# Patient Record
Sex: Female | Born: 1950 | Race: White | Hispanic: No | Smoking: Former smoker
Health system: Southern US, Community
[De-identification: ages and names within clinical notes are randomized; demographics above are authoritative.]

## PROBLEM LIST (undated history)

## (undated) DIAGNOSIS — K259 Gastric ulcer, unspecified as acute or chronic, without hemorrhage or perforation: Secondary | ICD-10-CM

## (undated) DIAGNOSIS — F329 Major depressive disorder, single episode, unspecified: Secondary | ICD-10-CM

## (undated) DIAGNOSIS — R809 Proteinuria, unspecified: Secondary | ICD-10-CM

## (undated) DIAGNOSIS — T7840XA Allergy, unspecified, initial encounter: Secondary | ICD-10-CM

## (undated) DIAGNOSIS — F32A Depression, unspecified: Secondary | ICD-10-CM

## (undated) DIAGNOSIS — Z87898 Personal history of other specified conditions: Secondary | ICD-10-CM

## (undated) DIAGNOSIS — E559 Vitamin D deficiency, unspecified: Secondary | ICD-10-CM

## (undated) DIAGNOSIS — Z974 Presence of external hearing-aid: Secondary | ICD-10-CM

## (undated) DIAGNOSIS — H269 Unspecified cataract: Secondary | ICD-10-CM

## (undated) DIAGNOSIS — N390 Urinary tract infection, site not specified: Secondary | ICD-10-CM

## (undated) DIAGNOSIS — I10 Essential (primary) hypertension: Secondary | ICD-10-CM

## (undated) DIAGNOSIS — G43909 Migraine, unspecified, not intractable, without status migrainosus: Secondary | ICD-10-CM

## (undated) DIAGNOSIS — R609 Edema, unspecified: Secondary | ICD-10-CM

## (undated) DIAGNOSIS — R519 Headache, unspecified: Secondary | ICD-10-CM

## (undated) DIAGNOSIS — N189 Chronic kidney disease, unspecified: Secondary | ICD-10-CM

## (undated) DIAGNOSIS — N951 Menopausal and female climacteric states: Secondary | ICD-10-CM

## (undated) DIAGNOSIS — B029 Zoster without complications: Secondary | ICD-10-CM

## (undated) DIAGNOSIS — I82409 Acute embolism and thrombosis of unspecified deep veins of unspecified lower extremity: Secondary | ICD-10-CM

## (undated) DIAGNOSIS — H532 Diplopia: Secondary | ICD-10-CM

## (undated) DIAGNOSIS — R51 Headache: Secondary | ICD-10-CM

## (undated) DIAGNOSIS — K219 Gastro-esophageal reflux disease without esophagitis: Secondary | ICD-10-CM

## (undated) HISTORY — DX: Menopausal and female climacteric states: N95.1

## (undated) HISTORY — DX: Headache: R51

## (undated) HISTORY — DX: Acute embolism and thrombosis of unspecified deep veins of unspecified lower extremity: I82.409

## (undated) HISTORY — DX: Diplopia: H53.2

## (undated) HISTORY — DX: Chronic kidney disease, unspecified: N18.9

## (undated) HISTORY — DX: Major depressive disorder, single episode, unspecified: F32.9

## (undated) HISTORY — DX: Gastric ulcer, unspecified as acute or chronic, without hemorrhage or perforation: K25.9

## (undated) HISTORY — DX: Essential (primary) hypertension: I10

## (undated) HISTORY — DX: Proteinuria, unspecified: R80.9

## (undated) HISTORY — DX: Urinary tract infection, site not specified: N39.0

## (undated) HISTORY — DX: Allergy, unspecified, initial encounter: T78.40XA

## (undated) HISTORY — DX: Zoster without complications: B02.9

## (undated) HISTORY — PX: EYE SURGERY: SHX253

## (undated) HISTORY — DX: Migraine, unspecified, not intractable, without status migrainosus: G43.909

## (undated) HISTORY — DX: Gastro-esophageal reflux disease without esophagitis: K21.9

## (undated) HISTORY — DX: Headache, unspecified: R51.9

## (undated) HISTORY — DX: Depression, unspecified: F32.A

## (undated) HISTORY — DX: Edema, unspecified: R60.9

---

## 1981-07-29 HISTORY — PX: BREAST ENHANCEMENT SURGERY: SHX7

## 1981-07-29 HISTORY — PX: AUGMENTATION MAMMAPLASTY: SUR837

## 1998-07-29 HISTORY — PX: NASAL SEPTUM SURGERY: SHX37

## 2006-07-29 LAB — HM COLONOSCOPY: Colonoscopy, External: NORMAL

## 2008-07-29 DIAGNOSIS — I82409 Acute embolism and thrombosis of unspecified deep veins of unspecified lower extremity: Secondary | ICD-10-CM

## 2008-07-29 HISTORY — DX: Acute embolism and thrombosis of unspecified deep veins of unspecified lower extremity: I82.409

## 2008-07-29 LAB — HM DEXA SCAN
Amb DEXA, External: NORMAL
BONE DENSITY, EXTERNAL: NORMAL

## 2010-05-02 LAB — HM MAMMOGRAPHY: Mammography, External: NORMAL

## 2011-08-05 LAB — HM PAP SMEAR: Pap Smear, External: NEGATIVE

## 2012-01-03 LAB — HM MAMMOGRAPHY

## 2012-06-09 LAB — HM COLONOSCOPY: HM Colonoscopy: ABNORMAL — AB

## 2013-01-14 LAB — BASIC METABOLIC PANEL
BUN: 13 mg/dL (ref 4–21)
Creatinine: 1 mg/dL (ref 0.5–1.1)
GLUCOSE: 96 mg/dL
Potassium: 4.4 mmol/L (ref 3.4–5.3)
SODIUM: 143 mmol/L (ref 137–147)

## 2013-01-14 LAB — LIPID PANEL
CHOLESTEROL: 189 mg/dL (ref 0–200)
HDL: 59 mg/dL (ref 35–70)
LDL Cholesterol: 111 mg/dL
Triglycerides: 96 mg/dL (ref 40–160)

## 2013-01-14 LAB — HEPATIC FUNCTION PANEL
ALT: 16 U/L (ref 7–35)
AST: 18 U/L (ref 13–35)
Alkaline Phosphatase: 56 U/L (ref 25–125)
BILIRUBIN, TOTAL: 1 mg/dL

## 2013-01-14 LAB — HEMOGLOBIN A1C: HEMOGLOBIN A1C: 5.6

## 2013-01-14 LAB — MICROALBUMIN, URINE: Microalb, Ur: 14.4

## 2013-01-14 LAB — AMB EXT LDL-C: LDL-C, External: 111

## 2013-01-14 LAB — AMB EXT HGBA1C: Hemoglobin A1c, External: 5.6

## 2013-04-22 LAB — CBC AND DIFFERENTIAL
HEMATOCRIT: 40 % (ref 36–46)
HEMOGLOBIN: 12.7 g/dL (ref 12.0–16.0)
Neutrophils Absolute: 4 /uL
Platelets: 253 10*3/uL (ref 150–399)
WBC: 6.3 10*3/mL

## 2013-04-22 LAB — BASIC METABOLIC PANEL
BUN: 18 mg/dL (ref 4–21)
CREATININE: 1 mg/dL (ref 0.5–1.1)
GLUCOSE: 99 mg/dL
POTASSIUM: 4.4 mmol/L (ref 3.4–5.3)
SODIUM: 141 mmol/L (ref 137–147)

## 2013-04-22 LAB — HEPATIC FUNCTION PANEL
ALT: 14 U/L (ref 7–35)
AST: 17 U/L (ref 13–35)
Alkaline Phosphatase: 58 U/L (ref 25–125)
Bilirubin, Total: 0.6 mg/dL

## 2014-02-04 MED ORDER — BUPROPION 75 MG TAB
75 mg | ORAL_TABLET | Freq: Two times a day (BID) | ORAL | Status: DC
Start: 2014-02-04 — End: 2014-09-06

## 2014-02-04 MED ORDER — ALPRAZOLAM 0.5 MG TAB
0.5 mg | ORAL_TABLET | Freq: Three times a day (TID) | ORAL | Status: DC | PRN
Start: 2014-02-04 — End: 2014-09-06

## 2014-02-04 MED ORDER — TIZANIDINE 4 MG TAB
4 mg | ORAL_TABLET | Freq: Every evening | ORAL | Status: DC
Start: 2014-02-04 — End: 2014-09-06

## 2014-02-04 MED ORDER — FUROSEMIDE 20 MG TAB
20 mg | ORAL_TABLET | Freq: Every day | ORAL | Status: DC
Start: 2014-02-04 — End: 2014-09-06

## 2014-02-04 MED ORDER — CITALOPRAM 20 MG TAB
20 mg | ORAL_TABLET | Freq: Every day | ORAL | Status: DC
Start: 2014-02-04 — End: 2014-08-24

## 2014-02-04 MED ORDER — ESOMEPRAZOLE MAGNESIUM 40 MG CAP, DELAYED RELEASE
40 mg | ORAL_CAPSULE | Freq: Every day | ORAL | Status: DC
Start: 2014-02-04 — End: 2014-08-24

## 2014-02-04 NOTE — Progress Notes (Signed)
[facility]  [address]  [place], [place] [address]  Phone [phone]  Fax:  [phone]    Patient: April Moss  Date of Birth: August 03, 1950  MEDICAL RECORD NUMBER [id number]  Visit Date: 02/04/2014  Author:  Paula Compton L. Fermin Schwab    Family Practice Clinic Note    Chief Complaint   Patient presents with   ??? Anxiety   ??? Depression       History of Present Illness  Anxiety and or Depression  Donise Woodle is a 63 y.o. female who presents for a complaint of anxiety/depression. she's symptoms began 30 years ago and include: Symptoms; depression:1002}, depressed, grieving,  difficulty concentrating sleeping more. Additional complaints include: depression,  But notes she is improving and wants to taper back on wellbutrin . Patient is able/unable to identify any inciting event or aggravating factors. she has/does not have a history of anxiety/depression/panic attacks, and has/has not undergone treatment in the past. Past treatments include: SSRI, Wellbutrin, Celexa. Patient does have a family history of anxiety/depression.             Past History:    Allergies:  Allergies   Allergen Reactions   ??? Erythromycin Nausea and Vomiting   ??? Sulfa (Sulfonamide Antibiotics) Unknown (comments)     Can't remember       Current Problem List:   Patient Active Problem List   Diagnosis Code   ??? 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 Esophageal reflux 530.81   ??? Depressive disorder, not elsewhere classified 311   ??? Chronic kidney disease, unspecified 585.9   ??? Unspecified vitamin D deficiency 268.9   ??? Allergic rhinitis, cause unspecified 477.9   ??? Unspecified essential hypertension 401.9   ??? Other and unspecified hyperlipidemia 272.4   ??? Arthralgia of temporomandibular joint 524.62   ??? Unspecified hearing loss 389.9   ??? Other malaise and fatigue 780.79       Current Medications:   Current Outpatient Prescriptions   Medication Sig Dispense    ??? tiZANidine (ZANAFLEX) 4 mg tablet Take 4 mg by mouth every six (6) hours as needed.    ??? furosemide (LASIX) 20 mg tablet Take  by mouth daily.    ??? citalopram (CELEXA) 20 mg tablet Take  by mouth daily.    ??? esomeprazole (NEXIUM) 40 mg capsule Take  by mouth daily.    ??? ALPRAZolam (XANAX) 0.5 mg tablet Take  by mouth.    ??? buPROPion XL (WELLBUTRIN XL) 150 mg tablet Take 150 mg by mouth every morning.      No current facility-administered medications for this visit.       Social History:   History   Substance Use Topics   ??? Smoking status: Former Smoker     Quit date: 02/05/1971   ??? Smokeless tobacco: Not on file      Comment: SMOKED FOR 1 YEAR AT AGE 84; NONE SINCE THEN   ??? Alcohol Use: No       Family History:   Family History   Problem Relation Age of Onset   ??? Hypertension Mother    ??? Arthritis-osteo Mother    ??? Hypertension Father    ??? Arthritis-osteo Father    ??? Migraines Father    ??? Diabetes Brother      NON-INSULIN DEPENDENT   ??? Hypertension Brother    ??? Migraines Brother    ??? Stroke Maternal Grandmother      IN HER 80s   ???  Diabetes Maternal Grandfather      NON-INSULIN DEPENDENT   ??? Cancer Paternal Grandfather      PANCREATIC   ??? Cancer Maternal Grandmother      unknown type       Surgical History:  Past Surgical History   Procedure Laterality Date   ??? Hx breast augmentation  1983   ??? Hx other surgical  2000     SINUS         ROS  Review of Systems   Respiratory: Negative for cough and shortness of breath.    Cardiovascular: Negative for chest pain and leg swelling.        No claudication   Neurological: Negative for dizziness and light-headedness.   Psychiatric/Behavioral: Negative for confusion.         BP 150/90 mmHg   Pulse 95   Temp(Src) 98.8 ??F (37.1 ??C) (Oral)   Ht 5\' 5"  (1.651 m)   Wt 217 lb 6.4 oz (98.612 kg)   BMI 36.18 kg/m2   SpO2 94%  Body mass index is 36.18 kg/(m^2).   Physical Exam    Physical Exam   Constitutional: She is oriented to person, place, and time. She appears  well-developed and well-nourished.   Neck: Neck supple. No thyroid mass and no thyromegaly present.   Cardiovascular: Normal rate, regular rhythm and normal heart sounds.    Pulses:       Posterior tibial pulses are 2+ on the right side, and 2+ on the left side.   Pulmonary/Chest: Effort normal and breath sounds normal.   Musculoskeletal:   No lower extremity edema.   Neurological: She is alert and oriented to person, place, and time.   Skin: No rash noted.   Psychiatric: She has a normal mood and affect.             ASSESSMENT & PLAN    1. Gastroesophageal reflux disease without esophagitis  stable  - esomeprazole (NEXIUM) 40 mg capsule; Take 1 Cap by mouth daily. Indications: GASTROESOPHAGEAL REFLUX  Dispense: 30 Cap; Refill: 5    2. History of ulcer disease  stable  - esomeprazole (NEXIUM) 40 mg capsule; Take 1 Cap by mouth daily. Indications: GASTROESOPHAGEAL REFLUX  Dispense: 30 Cap; Refill: 5  - CBC WITH AUTOMATED DIFF; Standing    3. Depressive disorder, not elsewhere classified  Stable on med  - citalopram (CELEXA) 20 mg tablet; Take 1 Tab by mouth daily.  Dispense: 30 Tab; Refill: 5  - ALPRAZolam (XANAX) 0.5 mg tablet; Take 1 Tab by mouth three (3) times daily as needed for Anxiety. Max Daily Amount: 1.5 mg.  Dispense: 60 Tab; Refill: 5  - buPROPion (WELLBUTRIN) 75 mg tablet; Take 1 Tab by mouth two (2) times a day.  Dispense: 60 Tab; Refill: 5    4. Myalgia and myositis, unspecified  stable  - tiZANidine (ZANAFLEX) 4 mg tablet; Take 1 Tab by mouth nightly.  Dispense: 30 Tab; Refill: 1    5. Unspecified vitamin D deficiency  Stable - needs reevaluated   - VITAMIN D, 25 HYDROXY; Standing    6. Edema  stable  - furosemide (LASIX) 20 mg tablet; Take 1 Tab by mouth daily.  Dispense: 30 Tab; Refill: 5  - METABOLIC PANEL, COMPREHENSIVE; Standing    7. Anemia, unspecified anemia type  recheck  - CBC WITH AUTOMATED DIFF; Standing    8. Abnormal thyroid blood test  Recheck for underlying disease   - THYROID PANEL W/TSH; Standing  9. Mixed hyperlipidemia  stable  - METABOLIC PANEL, COMPREHENSIVE; Standing  - LIPID PANEL; Standing    I have reviewed the patient's past medical history, social history and family history and vitals.  We have discussed treatment plan and follow up and given patient instructions.  Patient's questions are answered and we will follow up as indicated.    Follow-up Disposition:  Return in about 6 weeks (around 03/18/2014) for 6 weeks med/bp recheck 6 months labs and recheck, Labs 1 week prior to office visit.    Jamian Andujo L. Konrad Dolores, PA-C

## 2014-02-04 NOTE — Patient Instructions (Signed)
Preventing a Relapse of Depression: After Your Visit  Your Care Instructions  A relapse of depression means your symptoms have come back after you have gotten better. This illness often comes and goes during a lifetime. But there are many things you can do to keep it from coming back.  Follow-up care is a key part of your treatment and safety. Be sure to make and go to all appointments, and call your doctor if you are having problems. It's also a good idea to know your test results and keep a list of the medicines you take.  What do you need to know?  Know your risk of relapse  Talk to your doctor to find out if you are at risk of relapse. Many things can make a person more likely to relapse into depression. These include having a family member with depression, dealing with serious problems in a relationship or a job, having a serious medical condition, or abusing drugs or alcohol.  It is important to know your risk and to recognize warning signs of relapse. Once you know these things, you will be better able to keep it from happening to you.  Know the warning signs of relapse  The two most common signs of relapse are:  ?? Feeling sad or hopeless.  ?? Losing interest in your daily activities.  You may have other symptoms, such as:  ?? You lose or gain weight.  ?? You sleep too much or not enough.  ?? You feel restless and unable to sit still.  ?? You feel unable to move.  ?? You feel tired all the time.  ?? You feel unworthy or guilty without an obvious reason.  ?? You have problems concentrating, remembering, or making decisions.  ?? You think often about death or suicide.  ?? You feel angry or have panic attacks.  How can you care for yourself at home?  ?? Take your medicine as prescribed. Call your doctor if you have any problems with your medicine. Many people take their medicines for at least 6 months after they have recovered. This often helps keep symptoms from  coming back. However, if your depression keeps coming back, you may have to take medicine for the rest of your life.  ?? Continue counseling even after you have stopped taking medicine.  ?? Eat healthy foods. Include fruits, vegetables, beans, and whole grains in your diet each day.  ?? Get at least 30 minutes of exercise on most days of the week. Walking is a good choice. You also may want to do other activities, such as running, swimming, cycling, or playing tennis or team sports.  ?? See your doctor right away if you have new symptoms or feel that your depression is coming back.  ?? Keep a regular sleep schedule. Try for 8 hours of sleep a night.  ?? Avoid alcohol and illegal drugs.  ?? Keep the numbers for these national suicide hotlines: 1-800-273-TALK (1-800-273-8255) and 1-800-SUICIDE (1-800-784-2433). If you or someone you know talks about suicide or feeling hopeless, get help right away.  When should you call for help?  Call 911 anytime you think you may need emergency care. For example, call if:  ?? You are thinking about suicide or are threatening suicide.  ?? You feel you cannot stop from hurting yourself or someone else.  ?? You hear or see things that aren't real.  ?? You think or speak in a bizarre way that is not like your usual behavior.    Call your doctor now or seek immediate medical care if:  ?? You are drinking a lot of alcohol or using illegal drugs.  ?? You are talking or writing about death.  Watch closely for changes in your health, and be sure to contact your doctor if:  ?? You find it hard or it's getting harder to deal with school, a job, family, or friends.  ?? You think your treatment is not helping or you are not getting better.  ?? Your symptoms get worse or you get new symptoms.  ?? You have any problems with your antidepressant medicines, such as side effects, or you are thinking about stopping your medicine.  ?? You are having manic behavior, such as having very high energy, needing  less sleep than normal, or showing risky behavior such as spending money you don't have or abusing others verbally or physically.   Where can you learn more?   Go to http://www.healthwise.net/[facility]  Enter C630 in the search box to learn more about "Preventing a Relapse of Depression: After Your Visit."   ?? 2006-2015 Healthwise, [employer]. Care instructions adapted under license by [place] (which disclaims liability or warranty for this information). This care instruction is for use with your licensed healthcare professional. If you have questions about a medical condition or this instruction, always ask your healthcare professional. Healthwise, Incorporated disclaims any warranty or liability for your use of this information.  Content Version: 10.5.422740; Current as of: June 11, 2013

## 2014-08-24 ENCOUNTER — Encounter

## 2014-08-24 MED ORDER — CITALOPRAM 20 MG TAB
20 mg | ORAL_TABLET | Freq: Every day | ORAL | Status: DC
Start: 2014-08-24 — End: 2014-09-06

## 2014-08-24 MED ORDER — ESOMEPRAZOLE MAGNESIUM 40 MG CAP, DELAYED RELEASE
40 mg | ORAL_CAPSULE | Freq: Every day | ORAL | Status: DC
Start: 2014-08-24 — End: 2015-02-13

## 2014-08-24 NOTE — Telephone Encounter (Signed)
Requested Prescriptions     Pending Prescriptions Disp Refills   ??? esomeprazole (NEXIUM) 40 mg capsule 30 Cap 3     Sig: Take 1 Cap by mouth daily. Indications: GASTROESOPHAGEAL REFLUX   ??? citalopram (CELEXA) 20 mg tablet 30 Tab 3     Sig: Take 1 Tab by mouth daily.

## 2014-09-06 ENCOUNTER — Ambulatory Visit: Admit: 2014-09-06 | Discharge: 2014-09-06 | Payer: BLUE CROSS/BLUE SHIELD | Attending: Medical | Primary: Medical

## 2014-09-06 DIAGNOSIS — F330 Major depressive disorder, recurrent, mild: Secondary | ICD-10-CM

## 2014-09-06 MED ORDER — ALPRAZOLAM 0.5 MG TAB
0.5 mg | ORAL_TABLET | Freq: Three times a day (TID) | ORAL | Status: DC | PRN
Start: 2014-09-06 — End: 2015-02-13

## 2014-09-06 MED ORDER — CITALOPRAM 20 MG TAB
20 mg | ORAL_TABLET | Freq: Every day | ORAL | Status: DC
Start: 2014-09-06 — End: 2015-02-13

## 2014-09-06 MED ORDER — BUPROPION 75 MG TAB
75 mg | ORAL_TABLET | Freq: Two times a day (BID) | ORAL | Status: AC
Start: 2014-09-06 — End: ?

## 2014-09-06 MED ORDER — FUROSEMIDE 20 MG TAB
20 mg | ORAL_TABLET | Freq: Every day | ORAL | Status: DC | PRN
Start: 2014-09-06 — End: 2015-02-13

## 2014-09-06 MED ORDER — TIZANIDINE 4 MG TAB
4 mg | ORAL_TABLET | Freq: Every evening | ORAL | Status: DC
Start: 2014-09-06 — End: 2015-02-13

## 2014-09-06 NOTE — Patient Instructions (Signed)
Recovering From Depression: Care Instructions  Your Care Instructions  Taking good care of yourself is important as you recover from depression. In time, your symptoms will fade as your treatment takes hold. Do not give up. Instead, focus your energy on getting better.  Your mood will improve. It just takes some time. Focus on things that can help you feel better, such as being with friends and family, eating well, and getting enough rest. But take things slowly. Do not do too much too soon. You will begin to feel better gradually.  Follow-up care is a key part of your treatment and safety. Be sure to make and go to all appointments, and call your doctor if you are having problems. It's also a good idea to know your test results and keep a list of the medicines you take.  How can you care for yourself at home?  Be realistic  ?? If you have a large task to do, break it up into smaller steps you can handle, and just do what you can.  ?? You may want to put off important decisions until your depression has lifted. If you have plans that will have a major impact on your life, such as marriage, divorce, or a job change, try to wait a bit. Talk it over with friends and loved ones who can help you look at the overall picture first.  ?? Reaching out to people for help is important. Do not isolate yourself. Let your family and friends help you. Find someone you can trust and confide in, and talk to that person.  ?? Be patient, and be kind to yourself. Remember that depression is not your fault and is not something you can overcome with willpower alone. Treatment is necessary for depression, just like for any other illness. Feeling better takes time, and your mood will improve little by little.  Stay active  ?? Stay busy and get outside. Take a walk, or try some other light exercise.  ?? Talk with your doctor about an exercise program. Exercise can help with mild depression.   ?? Go to a movie or concert. Take part in a church activity or other social gathering. Go to a ball game.  ?? Ask a friend to have dinner with you.  Take care of yourself  ?? Eat a balanced diet with plenty of fresh fruits and vegetables, whole grains, and lean protein. If you have lost your appetite, eat small snacks rather than large meals.  ?? Avoid drinking alcohol or using illegal drugs. Do not take medicines that have not been prescribed for you. They may interfere with medicines you may be taking for depression, or they may make your depression worse.  ?? Take your medicines exactly as they are prescribed. You may start to feel better within 1 to 3 weeks of taking antidepressant medicine. But it can take as many as 6 to 8 weeks to see more improvement. If you have questions or concerns about your medicines, or if you do not notice any improvement by 3 weeks, talk to your doctor.  ?? If you have any side effects from your medicine, tell your doctor. Antidepressants can make you feel tired, dizzy, or nervous. Some people have dry mouth, constipation, headaches, sexual problems, or diarrhea. Many of these side effects are mild and will go away on their own after you have been taking the medicine for a few weeks. Some may last longer. Talk to your doctor if side effects are   bothering you too much. You might be able to try a different medicine.  ?? Get enough sleep. If you have problems sleeping:  ?? Go to bed at the same time every night, and get up at the same time every morning.  ?? Keep your bedroom dark and quiet.  ?? Do not exercise after 5:00 p.m.  ?? Avoid drinks with caffeine after 5:00 p.m.  ?? Avoid sleeping pills unless they are prescribed by the doctor treating your depression. Sleeping pills may make you groggy during the day, and they may interact with other medicine you are taking.  ?? If you have any other illnesses, such as diabetes, heart disease, or  high blood pressure, make sure to continue with your treatment. Tell your doctor about all of the medicines you take, including those with or without a prescription.  ?? Keep the numbers for these national suicide hotlines: 1-800-273-TALK (1-800-273-8255) and 1-800-SUICIDE (1-800-784-2433). If you or someone you know talks about suicide or feeling hopeless, get help right away.  When should you call for help?  Call 911 anytime you think you may need emergency care. For example, call if:  ?? You feel like hurting yourself or someone else.  ?? Someone you know has depression and is about to attempt or is attempting suicide.  Call your doctor now or seek immediate medical care if:  ?? You hear voices.  ?? Someone you know has depression and:  ?? Starts to give away his or her possessions.  ?? Uses illegal drugs or drinks alcohol heavily.  ?? Talks or writes about death, including writing suicide notes or talking about guns, knives, or pills.  ?? Starts to spend a lot of time alone.  ?? Acts very aggressively or suddenly appears calm.  Watch closely for changes in your health, and be sure to contact your doctor if:  ?? You do not get better as expected.   Where can you learn more?   Go to http://www.healthwise.net/[facility]  Enter N529 in the search box to learn more about "Recovering From Depression: Care Instructions."   ?? 2006-2015 Healthwise, Inc[employer]. Care instructions adapted under license by [place] (which disclaims liability or warranty for this information). This care instruction is for use with your licensed healthcare professional. If you have questions about a medical condition or this instruction, always ask your healthcare professional. [employer], Inc[employer] disclaims any warranty or liability for your use of this information.  Content Version: 10.7.482551; Current as of: November 18, 2013

## 2014-09-06 NOTE — Progress Notes (Signed)
[facility]  [address]  [place], [place] [address]  Phone [phone]  Fax:  [phone]    Patient: April Moss  Date of Birth: 04-06-1951  MEDICAL RECORD NUMBER [id number]  Visit Date: 09/06/2014  Author:  Paula Compton L. Fermin Schwab    Family Practice Clinic Note    Chief Complaint   Patient presents with   ??? Anxiety   ??? Depression       History of Present Illness  Chronic issues  Depression and Anxiety  Feels stable on meds uses wellbutrin 2 one day and 1 the alternative day, uses celexa every day and then xanax prn    Edema  Uses lasix prn and not very often and stable    Muscle pain  Stable on meds but ran out recently  Uses zanaflex for neck and shoulder pain at night at night and is prn and doesn't always need it but ran out and noticed it too        Past History:    Allergies:  Allergies   Allergen Reactions   ??? Erythromycin Nausea and Vomiting   ??? Sulfa (Sulfonamide Antibiotics) Unknown (comments)     Can't remember       Current Problem List:   Patient Active Problem List   Diagnosis Code   ??? 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 Esophageal reflux K21.9   ??? Depressive disorder, not elsewhere classified F32.9   ??? Chronic kidney disease, unspecified N18.9   ??? Unspecified vitamin D deficiency E55.9   ??? Allergic rhinitis, cause unspecified J30.9   ??? Unspecified essential hypertension I10   ??? Other and unspecified hyperlipidemia E78.5   ??? Arthralgia of temporomandibular joint M26.62   ??? Unspecified hearing loss H91.90   ??? Other malaise and fatigue R53.81, R53.83   ??? Myalgia and myositis, unspecified M79.1, M60.9   ??? Dysmetabolic syndrome X E88.81       Current Medications:   Current Outpatient Prescriptions   Medication Sig Dispense   ??? esomeprazole (NEXIUM) 40 mg capsule Take 1 Cap by mouth daily. Indications: GASTROESOPHAGEAL REFLUX 30 Cap   ??? citalopram (CELEXA) 20 mg tablet Take 1 Tab by mouth daily. 30 Tab   ??? furosemide (LASIX) 20 mg tablet Take 1 Tab by mouth daily. 30 Tab    ??? ALPRAZolam (XANAX) 0.5 mg tablet Take 1 Tab by mouth three (3) times daily as needed for Anxiety. Max Daily Amount: 1.5 mg. 60 Tab   ??? tiZANidine (ZANAFLEX) 4 mg tablet Take 1 Tab by mouth nightly. 30 Tab   ??? buPROPion (WELLBUTRIN) 75 mg tablet Take 1 Tab by mouth two (2) times a day. 60 Tab     No current facility-administered medications for this visit.       Social History:   History     Social History Narrative      History     Social History   ??? Marital Status: SINGLE     Spouse Name: N/A     Number of Children: N/A   ??? Years of Education: N/A     Occupational History   ??? Not on file.     Social History Main Topics   ??? Smoking status: Former Smoker     Quit date: 02/05/1971   ??? Smokeless tobacco: Not on file      Comment: SMOKED FOR 1 YEAR AT AGE 22; NONE SINCE THEN   ??? Alcohol Use: No   ??? Drug Use: No   ???  Sexual Activity: Not on file     Other Topics Concern   ??? Not on file     Social History Narrative       Family History:   Family History   Problem Relation Age of Onset   ??? Hypertension Mother    ??? Arthritis-osteo Mother    ??? Hypertension Father    ??? Arthritis-osteo Father    ??? Migraines Father    ??? Diabetes Brother      NON-INSULIN DEPENDENT   ??? Hypertension Brother    ??? Migraines Brother    ??? Stroke Maternal Grandmother      IN HER 80s   ??? Diabetes Maternal Grandfather      NON-INSULIN DEPENDENT   ??? Cancer Paternal Grandfather      PANCREATIC   ??? Cancer Maternal Grandmother      unknown type      No family status information on file.       Surgical History:  Past Surgical History   Procedure Laterality Date   ??? Hx breast augmentation  1983   ??? Hx other surgical  2000     SINUS         ROS  Review of Systems   Constitutional: Negative for fever and chills.   Respiratory: Negative for shortness of breath.    Cardiovascular: Positive for chest pain. Negative for palpitations and leg swelling.   Gastrointestinal: Positive for nausea. Negative for vomiting.   Musculoskeletal: Positive for arthralgias.    Skin: Negative for rash.   Neurological: Negative for dizziness and light-headedness.   Psychiatric/Behavioral: Positive for decreased concentration and agitation. Negative for suicidal ideas, confusion and self-injury. The patient is nervous/anxious.          BP 110/80 mmHg   Pulse 101   Temp(Src) 97.9 ??F (36.6 ??C) (Oral)   Ht 5\' 5"  (1.651 m)   Wt 224 lb (101.606 kg)   BMI 37.28 kg/m2   SpO2 96%  Body mass index is 37.28 kg/(m^2).   Physical Exam    Physical Exam   Constitutional: She is oriented to person, place, and time. She appears well-developed and well-nourished.   Cardiovascular: Normal rate and regular rhythm.    Pulmonary/Chest: Effort normal and breath sounds normal.   Neurological: She is alert and oriented to person, place, and time.   Skin: Skin is warm.   Psychiatric: Her speech is normal. Judgment and thought content normal. She is slowed. Cognition and memory are normal. She exhibits a depressed mood.   Nursing note and vitals reviewed.          ASSESSMENT & PLAN  1. Mild episode of recurrent major depressive disorder (HCC)    - citalopram (CELEXA) 20 mg tablet; Take 1 Tab by mouth daily.  Dispense: 30 Tab; Refill: 5    2. Other depression    - buPROPion (WELLBUTRIN) 75 mg tablet; Take 1 Tab by mouth two (2) times a day.  Dispense: 60 Tab; Refill: 5  - ALPRAZolam (XANAX) 0.5 mg tablet; Take 1 Tab by mouth three (3) times daily as needed for Anxiety. Max Daily Amount: 1.5 mg.  Dispense: 60 Tab; Refill: 5    3. Myalgia    - tiZANidine (ZANAFLEX) 4 mg tablet; Take 1 Tab by mouth nightly.  Dispense: 30 Tab; Refill: 5    4. Generalized edema    - furosemide (LASIX) 20 mg tablet; Take 1 Tab by mouth daily as needed.  Dispense: 30 Tab; Refill: 5  5. Cervicalgia    - tiZANidine (ZANAFLEX) 4 mg tablet; Take 1 Tab by mouth nightly.  Dispense: 30 Tab; Refill: 5      Orders Placed This Encounter   ??? citalopram (CELEXA) 20 mg tablet     Sig: Take 1 Tab by mouth daily.     Dispense:  30 Tab     Refill:  5    ??? buPROPion (WELLBUTRIN) 75 mg tablet     Sig: Take 1 Tab by mouth two (2) times a day.     Dispense:  60 Tab     Refill:  5   ??? tiZANidine (ZANAFLEX) 4 mg tablet     Sig: Take 1 Tab by mouth nightly.     Dispense:  30 Tab     Refill:  5   ??? ALPRAZolam (XANAX) 0.5 mg tablet     Sig: Take 1 Tab by mouth three (3) times daily as needed for Anxiety. Max Daily Amount: 1.5 mg.     Dispense:  60 Tab     Refill:  5   ??? furosemide (LASIX) 20 mg tablet     Sig: Take 1 Tab by mouth daily as needed.     Dispense:  30 Tab     Refill:  5     I have reviewed the patient's past medical history, social history and family history and vitals.  We have discussed treatment plan and follow up and given patient instructions.  Patient's questions are answered and we will follow up as indicated.    Over 50% of today's office visit was spent in face to face time reviewing test results/records, prognosis, importance of compliance, education about disease process, benefits of medications, instructions for management of disease, and follow up plans.?? Total time spent was 25 minutes.    Follow-up Disposition:  Return in about 6 months (around 03/07/2015).    Ave Scharnhorst L. Konrad Dolores, PA-C

## 2014-09-08 NOTE — Telephone Encounter (Signed)
Patient called states she lost her celexa would like to know if you would ok her to get this refilled again.

## 2014-09-09 NOTE — Telephone Encounter (Signed)
I'm okay with refill -does pharmacy need clearance or does she need another refill?

## 2014-09-14 ENCOUNTER — Encounter: Admit: 2014-09-14 | Discharge: 2014-09-14 | Payer: BLUE CROSS/BLUE SHIELD | Primary: Medical

## 2014-09-14 DIAGNOSIS — Z87898 Personal history of other specified conditions: Secondary | ICD-10-CM

## 2014-09-15 ENCOUNTER — Encounter

## 2014-09-15 LAB — CBC WITH AUTOMATED DIFF
ABS. BASOPHILS: 0.1 10*3/uL (ref 0.0–0.2)
ABS. EOSINOPHILS: 0.2 10*3/uL (ref 0.0–0.4)
ABS. IMM. GRANS.: 0 10*3/uL (ref 0.0–0.1)
ABS. MONOCYTES: 0.4 10*3/uL (ref 0.1–0.9)
ABS. NEUTROPHILS: 3.4 10*3/uL (ref 1.4–7.0)
Abs Lymphocytes: 2.1 10*3/uL (ref 0.7–3.1)
BASOPHILS: 1 %
EOSINOPHILS: 3 %
HCT: 35 % (ref 34.0–46.6)
HGB: 11.4 g/dL (ref 11.1–15.9)
IMMATURE GRANULOCYTES: 0 %
Lymphocytes: 34 %
MCH: 24.8 pg — ABNORMAL LOW (ref 26.6–33.0)
MCHC: 32.6 g/dL (ref 31.5–35.7)
MCV: 76 fL — ABNORMAL LOW (ref 79–97)
MONOCYTES: 6 %
NEUTROPHILS: 56 %
PLATELET: 262 10*3/uL (ref 150–379)
RBC: 4.59 x10E6/uL (ref 3.77–5.28)
RDW: 16 % — ABNORMAL HIGH (ref 12.3–15.4)
WBC: 6.1 10*3/uL (ref 3.4–10.8)

## 2014-09-15 LAB — METABOLIC PANEL, COMPREHENSIVE
A-G Ratio: 1.9 (ref 1.1–2.5)
ALT (SGPT): 15 IU/L (ref 0–32)
AST (SGOT): 17 IU/L (ref 0–40)
Albumin: 4.2 g/dL (ref 3.6–4.8)
Alk. phosphatase: 70 IU/L (ref 39–117)
BUN/Creatinine ratio: 11 (ref 11–26)
BUN: 12 mg/dL (ref 8–27)
Bilirubin, total: 0.6 mg/dL (ref 0.0–1.2)
CO2: 23 mmol/L (ref 18–29)
Calcium: 9.3 mg/dL (ref 8.7–10.3)
Chloride: 106 mmol/L (ref 97–108)
Creatinine: 1.08 mg/dL — ABNORMAL HIGH (ref 0.57–1.00)
GFR est AA: 64 mL/min/{1.73_m2} (ref >59)
GFR est non-AA: 55 mL/min/{1.73_m2} — ABNORMAL LOW (ref >59)
GLOBULIN, TOTAL: 2.2 g/dL (ref 1.5–4.5)
Glucose: 105 mg/dL — ABNORMAL HIGH (ref 65–99)
Potassium: 4.5 mmol/L (ref 3.5–5.2)
Protein, total: 6.4 g/dL (ref 6.0–8.5)
Sodium: 143 mmol/L (ref 134–144)

## 2014-09-15 LAB — LIPID PANEL
Cholesterol, total: 196 mg/dL (ref 100–199)
HDL Cholesterol: 60 mg/dL (ref >39)
LDL, calculated: 118 mg/dL — ABNORMAL HIGH (ref 0–99)
Triglyceride: 92 mg/dL (ref 0–149)
VLDL, calculated: 18 mg/dL (ref 5–40)

## 2014-09-15 LAB — THYROID PANEL W/TSH
Free Thyroxine Index (FTI): 1.8 (ref 1.2–4.9)
T3 Uptake: 24 % (ref 24–39)
T4, Total: 7.7 ug/dL (ref 4.5–12.0)
TSH: 3.15 u[IU]/mL (ref 0.450–4.500)

## 2014-09-15 LAB — VITAMIN D, 25 HYDROXY: VITAMIN D, 25-HYDROXY: 26.4 ng/mL — ABNORMAL LOW (ref 30.0–100.0)

## 2014-09-15 MED ORDER — ERGOCALCIFEROL (VITAMIN D2) 50,000 UNIT CAP
1250 mcg (50,000 unit) | ORAL_CAPSULE | ORAL | Status: DC
Start: 2014-09-15 — End: 2015-02-13

## 2014-09-15 NOTE — Progress Notes (Signed)
Quick Note:        I sent in rx for vitamin d to take 1 weekly and recommend she get sunlight on face and forearms for about 10-15 min every day to activate it, LDL mildly elevated at 118, and kidney function is stage 3 - recommend returning to repeat vitamin d labs in 3 months to see if they are improved    ______

## 2014-09-15 NOTE — Progress Notes (Signed)
Quick Note:        informed    ______

## 2015-01-09 ENCOUNTER — Ambulatory Visit: Admit: 2015-01-09 | Discharge: 2015-01-09 | Payer: BLUE CROSS/BLUE SHIELD | Attending: Medical | Primary: Medical

## 2015-01-09 DIAGNOSIS — K120 Recurrent oral aphthae: Secondary | ICD-10-CM

## 2015-01-09 MED ORDER — DOXYCYCLINE 100 MG CAP
100 mg | ORAL_CAPSULE | Freq: Two times a day (BID) | ORAL | Status: AC
Start: 2015-01-09 — End: 2015-01-16

## 2015-01-09 MED ORDER — BROMPHENIRAMINE-PSEUDOEPHEDRINE-DM 2 MG-30 MG-10 MG/5 ML SYRUP
2-30-10 mg/5 mL | Freq: Three times a day (TID) | ORAL | Status: DC | PRN
Start: 2015-01-09 — End: 2015-02-13

## 2015-01-09 MED ORDER — CIPROFLOXACIN 0.3 % EYE DROPS
0.3 % | OPHTHALMIC | Status: DC
Start: 2015-01-09 — End: 2015-02-13

## 2015-01-09 NOTE — Progress Notes (Signed)
[facility]  [address]  [place], [place] [address]  Phone [phone]  Fax:  [phone]    Patient: April Moss  Date of Birth: 1951-03-18  Age 64 y.o.  Sex female  MEDICAL RECORD NUMBER [id number]  Visit Date: 01/09/2015  Author:  Paula Compton L. Fermin Schwab    Family Practice Clinic Note    Chief Complaint   Patient presents with   ??? URI     went to [facility] last Wednesday, 01/04/15.  strep screen neg.  given cough med, prednisone x 3 days.  is worse.     ??? Sore Throat     and mouth sores.   ??? Ear Pain     bilateral.       History of Present Illness     Seen at [facility] on June 8th and given prednisone and cough syrup. Improved for a few days then worsened this weekend. She was told it was viral. Symptoms associated include sore throat, ear aches, cough, sores in mouth and draining out of eyes and felt gritty and itchy and irritated eyes.        Past History:    Allergies:  Allergies   Allergen Reactions   ??? Erythromycin Nausea and Vomiting   ??? Sulfa (Sulfonamide Antibiotics) Unknown (comments)     Can't remember       Current Medications:   Current Outpatient Prescriptions   Medication Sig Dispense   ??? b complex vitamins tablet Take 1 Tab by mouth daily.    ??? Brompheniramine-Pseudoeph-DM (DIMETAPP) 2-30-10 mg/5 mL syrup     ??? ergocalciferol (ERGOCALCIFEROL) 50,000 unit capsule Take 1 Cap by mouth every seven (7) days. 12 Cap   ??? citalopram (CELEXA) 20 mg tablet Take 1 Tab by mouth daily. 30 Tab   ??? buPROPion (WELLBUTRIN) 75 mg tablet Take 1 Tab by mouth two (2) times a day. 60 Tab   ??? tiZANidine (ZANAFLEX) 4 mg tablet Take 1 Tab by mouth nightly. 30 Tab   ??? ALPRAZolam (XANAX) 0.5 mg tablet Take 1 Tab by mouth three (3) times daily as needed for Anxiety. Max Daily Amount: 1.5 mg. 60 Tab   ??? furosemide (LASIX) 20 mg tablet Take 1 Tab by mouth daily as needed. 30 Tab   ??? esomeprazole (NEXIUM) 40 mg capsule Take 1 Cap by mouth daily. Indications: GASTROESOPHAGEAL REFLUX 30 Cap      No current facility-administered medications for this visit.       Current Problem List:   Patient Active Problem List   Diagnosis Code   ??? 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 Esophageal reflux K21.9   ??? Depressive disorder, not elsewhere classified F32.9   ??? Chronic kidney disease, unspecified N18.9   ??? Unspecified vitamin D deficiency E55.9   ??? Allergic rhinitis, cause unspecified J30.9   ??? Unspecified essential hypertension I10   ??? Other and unspecified hyperlipidemia E78.5   ??? Arthralgia of temporomandibular joint M26.62   ??? Unspecified hearing loss H91.90   ??? Other malaise and fatigue R53.81, R53.83   ??? Myalgia and myositis, unspecified M79.1, M60.9   ??? Dysmetabolic syndrome X E88.81       Social History:   History     Social History Narrative      History     Social History   ??? Marital Status: SINGLE     Spouse Name: N/A   ??? Number of Children: N/A   ??? Years of Education: N/A  Occupational History   ??? Not on file.     Social History Main Topics   ??? Smoking status: Former Smoker     Quit date: 02/05/1971   ??? Smokeless tobacco: Never Used      Comment: SMOKED FOR 1 YEAR AT AGE 65; NONE SINCE THEN   ??? Alcohol Use: No   ??? Drug Use: No   ??? Sexual Activity: Not on file     Other Topics Concern   ??? Not on file     Social History Narrative       Family History:   Family History   Problem Relation Age of Onset   ??? Hypertension Mother    ??? Arthritis-osteo Mother    ??? Hypertension Father    ??? Arthritis-osteo Father    ??? Migraines Father    ??? Diabetes Brother      NON-INSULIN DEPENDENT   ??? Hypertension Brother    ??? Migraines Brother    ??? Stroke Maternal Grandmother      IN HER 80s   ??? Diabetes Maternal Grandfather      NON-INSULIN DEPENDENT   ??? Cancer Paternal Grandfather      PANCREATIC   ??? Cancer Maternal Grandmother      unknown type      No family status information on file.       Surgical History:  Past Surgical History   Procedure Laterality Date   ??? Hx breast augmentation  1983    ??? Hx other surgical  2000     SINUS       Past Medical history   Active Ambulatory Problems     Diagnosis Date Noted   ??? Temporomandibular joint disorders, unspecified    ??? Esophageal reflux    ??? Depressive disorder, not elsewhere classified    ??? Chronic kidney disease, unspecified    ??? Unspecified vitamin D deficiency    ??? Allergic rhinitis, cause unspecified    ??? Unspecified essential hypertension    ??? Other and unspecified hyperlipidemia    ??? Arthralgia of temporomandibular joint    ??? Unspecified hearing loss    ??? Other malaise and fatigue    ??? Myalgia and myositis, unspecified 02/04/2014   ??? Dysmetabolic syndrome X 02/04/2014     Resolved Ambulatory Problems     Diagnosis Date Noted   ??? No Resolved Ambulatory Problems     Past Medical History   Diagnosis Date   ??? Edema    ??? Family history of other endocrine and metabolic diseases(V18.19)    ??? Symptomatic menopausal or female climacteric states    ??? Microalbuminuria    ??? Phlebitis and thrombophlebitis of other site    ??? Shingles    ??? Acute bronchitis    ??? Wheezing    ??? Cough    ??? Acute sinusitis, unspecified    ??? Pain in joint, shoulder region    ??? Other abnormal blood chemistry    ??? Cellulitis and abscess of upper arm and forearm    ??? Rash and other nonspecific skin eruption    ??? Acute serous otitis media        ROS  Review of Systems   Constitutional: Positive for fever (low grade). Negative for chills.   HENT: Positive for facial swelling, rhinorrhea, sinus pressure and sore throat.    Respiratory: Negative for chest tightness. Cough: productive yellow and grey phlegm     Cardiovascular: Negative for chest pain.   Gastrointestinal: Negative  for nausea and vomiting.   Genitourinary: Negative for dysuria and hematuria.   Skin: Negative for rash.   Neurological: Positive for headaches. Negative for seizures, syncope and light-headedness.        But slipped on edge of tube as she was lifting leg up on shelf to wash.  She hit her right forehead     Psychiatric/Behavioral: Negative for agitation.         BP 154/86 mmHg   Pulse 94   Temp(Src) 98.7 ??F (37.1 ??C)   Ht  (1.651 m)   Wt 222 lb (100.699 kg)   BMI 36.94 kg/m2   SpO2 94%  Body mass index is 36.94 kg/(m^2).   Physical Exam    Physical Exam   Constitutional: She appears well-developed and well-nourished. She appears distressed.   HENT:   Right Ear: Tympanic membrane normal.   Left Ear: Tympanic membrane normal.   Nose: Mucosal edema and rhinorrhea present.   Mouth/Throat: Oropharynx is clear and moist and mucous membranes are normal. Oral lesions present.   Eyes: Pupils are equal, round, and reactive to light. Right conjunctiva is injected. Left conjunctiva is injected.   Neck: Neck supple.   Cardiovascular: Normal rate and regular rhythm.    Pulmonary/Chest: Effort normal and breath sounds normal.   Neurological: She is alert.   Skin: Skin is warm. No rash noted.   Nursing note and vitals reviewed.        ASSESSMENT & PLAN      1. Oral aphthae      2. Suppurative otitis media of right ear, unspecified chronicity      3. Cough      4. Acute URI      5. Acute viral conjunctivitis of both eyes      I have reviewed the patient's past medical history, social history and family history and vitals.  We have discussed treatment plan and follow up and given patient instructions.  Patient's questions are answered and we will follow up as indicated.    Over 50% of today's office visit was spent in face to face time reviewing test results/records, prognosis, importance of compliance, education about disease process, benefits of medications, instructions for management of disease, and follow up plans.?? Total time spent was 20 minutes.  Orders Placed This Encounter   ??? b complex vitamins tablet     Sig: Take 1 Tab by mouth daily.   ??? DISCONTD: Brompheniramine-Pseudoeph-DM (DIMETAPP) 2-30-10 mg/5 mL syrup     Sig:      Refill:  0   ??? ciprofloxacin HCl (CILOXIN) 0.3 % ophthalmic solution      Sig: Administer 1 Drop to both eyes every two (2) hours.     Dispense:  5 mL     Refill:  0   ??? Brompheniramine-Pseudoeph-DM (DIMETAPP) 2-30-10 mg/5 mL syrup     Sig: Take 5 mL by mouth three (3) times daily as needed.     Dispense:  120 mL     Refill:  1   ??? doxycycline (MONODOX) 100 mg capsule     Sig: Take 1 Cap by mouth two (2) times a day for 7 days.     Dispense:  14 Cap     Refill:  0     Follow-up Disposition: Not on File  Blanca Thornton L. Konrad Dolores, PA-C    There are no Patient Instructions on file for this visit.    Lindyn Vossler L. Konrad Dolores, PA-C

## 2015-02-13 ENCOUNTER — Ambulatory Visit: Admit: 2015-02-13 | Discharge: 2015-02-13 | Payer: BLUE CROSS/BLUE SHIELD | Attending: Medical | Primary: Medical

## 2015-02-13 DIAGNOSIS — E559 Vitamin D deficiency, unspecified: Secondary | ICD-10-CM

## 2015-02-13 MED ORDER — ALPRAZOLAM 0.5 MG TAB
0.5 mg | ORAL_TABLET | Freq: Three times a day (TID) | ORAL | Status: AC | PRN
Start: 2015-02-13 — End: ?

## 2015-02-13 MED ORDER — ZOSTER VACCINE LIVE (PF) 19,400 UNIT SUB-Q SOLN
19400 unit/0.65 mL | Freq: Once | SUBCUTANEOUS | Status: AC
Start: 2015-02-13 — End: 2015-02-13

## 2015-02-13 MED ORDER — FUROSEMIDE 20 MG TAB
20 mg | ORAL_TABLET | Freq: Every day | ORAL | Status: AC | PRN
Start: 2015-02-13 — End: ?

## 2015-02-13 MED ORDER — ESOMEPRAZOLE MAGNESIUM 40 MG CAP, DELAYED RELEASE
40 mg | ORAL_CAPSULE | Freq: Every day | ORAL | Status: AC
Start: 2015-02-13 — End: ?

## 2015-02-13 MED ORDER — CITALOPRAM 20 MG TAB
20 mg | ORAL_TABLET | Freq: Every day | ORAL | Status: AC
Start: 2015-02-13 — End: ?

## 2015-02-13 MED ORDER — ERGOCALCIFEROL (VITAMIN D2) 50,000 UNIT CAP
1250 mcg (50,000 unit) | ORAL_CAPSULE | ORAL | Status: AC
Start: 2015-02-13 — End: ?

## 2015-02-13 MED ORDER — TRIAMCINOLONE ACETONIDE 55 MCG NASAL SPRAY AEROSOL
55 mcg | Freq: Every day | NASAL | Status: AC
Start: 2015-02-13 — End: ?

## 2015-02-13 MED ORDER — TIZANIDINE 4 MG TAB
4 mg | ORAL_TABLET | Freq: Every evening | ORAL | Status: AC
Start: 2015-02-13 — End: ?

## 2015-02-13 NOTE — Progress Notes (Deleted)
[facility]  [address]  [place], [place] [address]  Phone [phone]  Fax:  [phone]    Patient: April Moss  Date of Birth: 03/30/1951  Age 64 y.o.  Sex female  MEDICAL RECORD NUMBER [id number]  Visit Date: 02/13/2015  Author:  Paula Compton L. Fermin Schwab    Family Practice Clinic Note    Chief Complaint   Patient presents with   ??? Depression       History of Present Illness      Past History:    Allergies:  Allergies   Allergen Reactions   ??? Erythromycin Nausea and Vomiting   ??? Sulfa (Sulfonamide Antibiotics) Unknown (comments)     Can't remember       Current Medications:   Current Outpatient Prescriptions   Medication Sig Dispense   ??? ergocalciferol (ERGOCALCIFEROL) 50,000 unit capsule Take 1 Cap by mouth every seven (7) days. 12 Cap   ??? citalopram (CELEXA) 20 mg tablet Take 1 Tab by mouth daily. 30 Tab   ??? tiZANidine (ZANAFLEX) 4 mg tablet Take 1 Tab by mouth nightly. 30 Tab   ??? ALPRAZolam (XANAX) 0.5 mg tablet Take 1 Tab by mouth three (3) times daily as needed for Anxiety. Max Daily Amount: 1.5 mg. 60 Tab   ??? furosemide (LASIX) 20 mg tablet Take 1 Tab by mouth daily as needed. 30 Tab   ??? esomeprazole (NEXIUM) 40 mg capsule Take 1 Cap by mouth daily. Indications: GASTROESOPHAGEAL REFLUX 30 Cap   ??? triamcinolone (NASACORT AQ) 55 mcg nasal inhaler 2 Sprays by Both Nostrils route daily. 1 Bottle   ??? varicella zoster vacine live (ZOSTAVAX) 19,400 unit/0.65 mL susr injection 1 Vial by SubCUTAneous route once for 1 dose. Please fax confirmation of vaccination after administration to [facility] at [phone] 1 Each   ??? b complex vitamins tablet Take 1 Tab by mouth daily.    ??? buPROPion (WELLBUTRIN) 75 mg tablet Take 1 Tab by mouth two (2) times a day. 60 Tab     No current facility-administered medications for this visit.       Current Problem List:   Patient Active Problem List   Diagnosis Code   ??? 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 Esophageal reflux K21.9    ??? Depressive disorder, not elsewhere classified F32.9   ??? Chronic kidney disease, unspecified N18.9   ??? Unspecified vitamin D deficiency E55.9   ??? Allergic rhinitis, cause unspecified J30.9   ??? Unspecified essential hypertension I10   ??? Other and unspecified hyperlipidemia E78.5   ??? Arthralgia of temporomandibular joint M26.62   ??? Unspecified hearing loss H91.90   ??? Other malaise and fatigue R53.81, R53.83   ??? Myalgia and myositis, unspecified M79.1, M60.9   ??? Dysmetabolic syndrome X E88.81       Social History:   History     Social History Narrative      History     Social History   ??? Marital Status: SINGLE     Spouse Name: N/A   ??? Number of Children: N/A   ??? Years of Education: N/A     Occupational History   ??? Not on file.     Social History Main Topics   ??? Smoking status: Former Smoker     Quit date: 02/05/1971   ??? Smokeless tobacco: Never Used      Comment: SMOKED FOR 1 YEAR AT AGE 43; NONE SINCE THEN   ??? Alcohol Use: No   ???  Drug Use: No   ??? Sexual Activity: Not on file     Other Topics Concern   ??? Not on file     Social History Narrative       Family History:   Family History   Problem Relation Age of Onset   ??? Hypertension Mother    ??? Arthritis-osteo Mother    ??? Hypertension Father    ??? Arthritis-osteo Father    ??? Migraines Father    ??? Diabetes Brother      NON-INSULIN DEPENDENT   ??? Hypertension Brother    ??? Migraines Brother    ??? Stroke Maternal Grandmother      IN HER 80s   ??? Diabetes Maternal Grandfather      NON-INSULIN DEPENDENT   ??? Cancer Paternal Grandfather      PANCREATIC   ??? Cancer Maternal Grandmother      unknown type      No family status information on file.       Surgical History:  Past Surgical History   Procedure Laterality Date   ??? Hx breast augmentation  1983   ??? Hx other surgical  2000     SINUS       Past Medical history   Active Ambulatory Problems     Diagnosis Date Noted   ??? Temporomandibular joint disorders, unspecified    ??? Esophageal reflux     ??? Depressive disorder, not elsewhere classified    ??? Chronic kidney disease, unspecified    ??? Unspecified vitamin D deficiency    ??? Allergic rhinitis, cause unspecified    ??? Unspecified essential hypertension    ??? Other and unspecified hyperlipidemia    ??? Arthralgia of temporomandibular joint    ??? Unspecified hearing loss    ??? Other malaise and fatigue    ??? Myalgia and myositis, unspecified 02/04/2014   ??? Dysmetabolic syndrome X 02/04/2014     Resolved Ambulatory Problems     Diagnosis Date Noted   ??? No Resolved Ambulatory Problems     Past Medical History   Diagnosis Date   ??? Edema    ??? Family history of other endocrine and metabolic diseases(V18.19)    ??? Symptomatic menopausal or female climacteric states    ??? Microalbuminuria    ??? Phlebitis and thrombophlebitis of other site    ??? Shingles    ??? Acute bronchitis    ??? Wheezing    ??? Cough    ??? Acute sinusitis, unspecified    ??? Pain in joint, shoulder region    ??? Other abnormal blood chemistry    ??? Cellulitis and abscess of upper arm and forearm    ??? Rash and other nonspecific skin eruption    ??? Acute serous otitis media        ROS  Review of Systems      BP 120/80 mmHg   Pulse 108   Temp(Src) 98.2 ??F (36.8 ??C) (Oral)   Ht 5\' 5"  (1.651 m)   Wt 224 lb 6.4 oz (101.787 kg)   BMI 37.34 kg/m2   SpO2 94%  Body mass index is 37.34 kg/(m^2).   Physical Exam    Physical Exam

## 2015-02-13 NOTE — Progress Notes (Signed)
[facility]  [address]  [place], [place] [address]  Phone [phone]  Fax:  [phone]    Patient: April Moss  Date of Birth: October 02, 1950  Age 64 y.o.  Sex female  MEDICAL RECORD NUMBER [id number]  Visit Date: 02/13/2015  Author:  Paula Compton L. Fermin Schwab    Family Practice Clinic Note    Chief Complaint   Patient presents with   ??? Depression       History of Present Illness  Reflux stable on meds  Vitamin d deficinency - stable on meds  Edema -stable on meds  Depression -stable on meds  Anxiety - stable on meds and only using xanax about once a day and sometimes using it bid and sometimes not needing it      Past History:    Allergies:  Allergies   Allergen Reactions   ??? Erythromycin Nausea and Vomiting   ??? Sulfa (Sulfonamide Antibiotics) Unknown (comments)     Can't remember       Current Medications:   Current Outpatient Prescriptions   Medication Sig Dispense   ??? ergocalciferol (ERGOCALCIFEROL) 50,000 unit capsule Take 1 Cap by mouth every seven (7) days. 12 Cap   ??? citalopram (CELEXA) 20 mg tablet Take 1 Tab by mouth daily. 30 Tab   ??? tiZANidine (ZANAFLEX) 4 mg tablet Take 1 Tab by mouth nightly. 30 Tab   ??? ALPRAZolam (XANAX) 0.5 mg tablet Take 1 Tab by mouth three (3) times daily as needed for Anxiety. Max Daily Amount: 1.5 mg. 60 Tab   ??? furosemide (LASIX) 20 mg tablet Take 1 Tab by mouth daily as needed. 30 Tab   ??? esomeprazole (NEXIUM) 40 mg capsule Take 1 Cap by mouth daily. Indications: GASTROESOPHAGEAL REFLUX 30 Cap   ??? b complex vitamins tablet Take 1 Tab by mouth daily.    ??? buPROPion (WELLBUTRIN) 75 mg tablet Take 1 Tab by mouth two (2) times a day. 60 Tab     No current facility-administered medications for this visit.       Current Problem List:   Patient Active Problem List   Diagnosis Code   ??? 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 Esophageal reflux K21.9   ??? Depressive disorder, not elsewhere classified F32.9   ??? Chronic kidney disease, unspecified N18.9    ??? Unspecified vitamin D deficiency E55.9   ??? Allergic rhinitis, cause unspecified J30.9   ??? Unspecified essential hypertension I10   ??? Other and unspecified hyperlipidemia E78.5   ??? Arthralgia of temporomandibular joint M26.62   ??? Unspecified hearing loss H91.90   ??? Other malaise and fatigue R53.81, R53.83   ??? Myalgia and myositis, unspecified M79.1, M60.9   ??? Dysmetabolic syndrome X E88.81       Social History:   History     Social History Narrative      History     Social History   ??? Marital Status: SINGLE     Spouse Name: N/A   ??? Number of Children: N/A   ??? Years of Education: N/A     Occupational History   ??? Not on file.     Social History Main Topics   ??? Smoking status: Former Smoker     Quit date: 02/05/1971   ??? Smokeless tobacco: Never Used      Comment: SMOKED FOR 1 YEAR AT AGE 3; NONE SINCE THEN   ??? Alcohol Use: No   ??? Drug Use: No   ??? Sexual  Activity: Not on file     Other Topics Concern   ??? Not on file     Social History Narrative       Family History:   Family History   Problem Relation Age of Onset   ??? Hypertension Mother    ??? Arthritis-osteo Mother    ??? Hypertension Father    ??? Arthritis-osteo Father    ??? Migraines Father    ??? Diabetes Brother      NON-INSULIN DEPENDENT   ??? Hypertension Brother    ??? Migraines Brother    ??? Stroke Maternal Grandmother      IN HER 80s   ??? Diabetes Maternal Grandfather      NON-INSULIN DEPENDENT   ??? Cancer Paternal Grandfather      PANCREATIC   ??? Cancer Maternal Grandmother      unknown type      No family status information on file.       Surgical History:  Past Surgical History   Procedure Laterality Date   ??? Hx breast augmentation  1983   ??? Hx other surgical  2000     SINUS       Past Medical history   Active Ambulatory Problems     Diagnosis Date Noted   ??? Temporomandibular joint disorders, unspecified    ??? Esophageal reflux    ??? Depressive disorder, not elsewhere classified    ??? Chronic kidney disease, unspecified    ??? Unspecified vitamin D deficiency     ??? Allergic rhinitis, cause unspecified    ??? Unspecified essential hypertension    ??? Other and unspecified hyperlipidemia    ??? Arthralgia of temporomandibular joint    ??? Unspecified hearing loss    ??? Other malaise and fatigue    ??? Myalgia and myositis, unspecified 02/04/2014   ??? Dysmetabolic syndrome X 02/04/2014     Resolved Ambulatory Problems     Diagnosis Date Noted   ??? No Resolved Ambulatory Problems     Past Medical History   Diagnosis Date   ??? Edema    ??? Family history of other endocrine and metabolic diseases(V18.19)    ??? Symptomatic menopausal or female climacteric states    ??? Microalbuminuria    ??? Phlebitis and thrombophlebitis of other site    ??? Shingles    ??? Acute bronchitis    ??? Wheezing    ??? Cough    ??? Acute sinusitis, unspecified    ??? Pain in joint, shoulder region    ??? Other abnormal blood chemistry    ??? Cellulitis and abscess of upper arm and forearm    ??? Rash and other nonspecific skin eruption    ??? Acute serous otitis media        ROS  Review of Systems   Constitutional: Negative for fever and chills.   Respiratory: Negative for shortness of breath.    Cardiovascular: Negative for chest pain, palpitations and leg swelling.   Gastrointestinal: Negative for nausea and vomiting.   Skin: Negative for rash.   Neurological: Negative for dizziness and light-headedness.   Psychiatric/Behavioral: Positive for dysphoric mood (improved now that she is not working). Negative for suicidal ideas, confusion, self-injury, decreased concentration and agitation. The patient is nervous/anxious.          BP 120/80 mmHg   Pulse 108   Temp(Src) 98.2 ??F (36.8 ??C) (Oral)   Ht 5\' 5"  (1.651 m)   Wt 224 lb 6.4 oz (101.787 kg)  BMI 37.34 kg/m2   SpO2 94%  Body mass index is 37.34 kg/(m^2).   Physical Exam    Physical Exam   Constitutional: She is oriented to person, place, and time. She appears well-developed and well-nourished.   Neck: Neck supple.   Cardiovascular: Normal rate, regular rhythm, normal heart sounds and  intact distal pulses.    Pulses:       Posterior tibial pulses are 2+ on the right side, and 2+ on the left side.   Pulmonary/Chest: Effort normal and breath sounds normal.   Musculoskeletal: She exhibits no edema.   No lower extremity edema.   Neurological: She is alert and oriented to person, place, and time.   Skin: No rash noted.   Psychiatric: She has a normal mood and affect.   Nursing note and vitals reviewed.      ASSESSMENT & PLAN      1. Vitamin D deficiency    - ergocalciferol (ERGOCALCIFEROL) 50,000 unit capsule; Take 1 Cap by mouth every seven (7) days.  Dispense: 12 Cap; Refill: 3    2. Mild episode of recurrent major depressive disorder (HCC)    - citalopram (CELEXA) 20 mg tablet; Take 1 Tab by mouth daily.  Dispense: 30 Tab; Refill: 5    3. Other depression    - ALPRAZolam (XANAX) 0.5 mg tablet; Take 1 Tab by mouth three (3) times daily as needed for Anxiety. Max Daily Amount: 1.5 mg.  Dispense: 60 Tab; Refill: 5    4. Myalgia    - tiZANidine (ZANAFLEX) 4 mg tablet; Take 1 Tab by mouth nightly.  Dispense: 30 Tab; Refill: 5    5. Cervicalgia    - tiZANidine (ZANAFLEX) 4 mg tablet; Take 1 Tab by mouth nightly.  Dispense: 30 Tab; Refill: 5    6. Generalized edema    - furosemide (LASIX) 20 mg tablet; Take 1 Tab by mouth daily as needed.  Dispense: 30 Tab; Refill: 5    7. History of ulcer disease    - esomeprazole (NEXIUM) 40 mg capsule; Take 1 Cap by mouth daily. Indications: GASTROESOPHAGEAL REFLUX  Dispense: 30 Cap; Refill: 5    8. Gastroesophageal reflux disease without esophagitis    - esomeprazole (NEXIUM) 40 mg capsule; Take 1 Cap by mouth daily. Indications: GASTROESOPHAGEAL REFLUX  Dispense: 30 Cap; Refill: 5    I have reviewed the patient's past medical history, social history and family history and vitals.  We have discussed treatment plan and follow up and given patient instructions.  Patient's questions are answered and we will follow up as indicated.     Over 50% of today's office visit was spent in face to face time reviewing test results/records, prognosis, importance of compliance, education about disease process, benefits of medications, instructions for management of disease, and follow up plans.?? Total time spent was 30 minutes.  Orders Placed This Encounter   ??? ergocalciferol (ERGOCALCIFEROL) 50,000 unit capsule     Sig: Take 1 Cap by mouth every seven (7) days.     Dispense:  12 Cap     Refill:  3   ??? citalopram (CELEXA) 20 mg tablet     Sig: Take 1 Tab by mouth daily.     Dispense:  30 Tab     Refill:  5   ??? tiZANidine (ZANAFLEX) 4 mg tablet     Sig: Take 1 Tab by mouth nightly.     Dispense:  30 Tab     Refill:  5   ???  ALPRAZolam (XANAX) 0.5 mg tablet     Sig: Take 1 Tab by mouth three (3) times daily as needed for Anxiety. Max Daily Amount: 1.5 mg.     Dispense:  60 Tab     Refill:  5   ??? furosemide (LASIX) 20 mg tablet     Sig: Take 1 Tab by mouth daily as needed.     Dispense:  30 Tab     Refill:  5   ??? esomeprazole (NEXIUM) 40 mg capsule     Sig: Take 1 Cap by mouth daily. Indications: GASTROESOPHAGEAL REFLUX     Dispense:  30 Cap     Refill:  5     Had EGD in 02/21/2012 no longer has ulcer but has history of ulcer   ??? triamcinolone (NASACORT AQ) 55 mcg nasal inhaler     Sig: 2 Sprays by Both Nostrils route daily.     Dispense:  1 Bottle     Refill:  6   ??? varicella zoster vacine live (ZOSTAVAX) 19,400 unit/0.65 mL susr injection     Sig: 1 Vial by SubCUTAneous route once for 1 dose. Please fax confirmation of vaccination after administration to [facility] at [phone]     Dispense:  1 Each     Refill:  0     Follow-up Disposition:  Return if symptoms worsen or fail to improve.  Jadore Mcguffin L. Konrad Dolores, PA-C    Patient Instructions     Learning About Vitamin D  Why is it important to get enough vitamin D?  Your body needs vitamin D to absorb calcium. Calcium keeps your bones and muscles, including your heart, healthy and strong. If your muscles don't  get enough calcium, they can cramp, hurt, or feel weak. You may have long-term (chronic) muscle aches and pains.  If you don't get enough vitamin D throughout life, you have an increased chance of having thin and brittle bones (osteoporosis) in your later years. Children who don't get enough vitamin D may not grow as much as others their age. They also have a chance of getting a rare disease called rickets. It causes weak bones.  Vitamin D and calcium are added to many foods. And your body uses sunshine to make its own vitamin D.  How much vitamin D do you need?  The Institute of Medicine recommends that people ages 1 through 33 get 600 IU (international units) every day. Adults 71 and older need 800 IU every day.  Blood tests for vitamin D can check your vitamin D level. But there is no standard normal range used by all laboratories. The Institute of Medicine recommends a blood level of 20 ng/mL of vitamin D for healthy bones. And most people in the [place] and [place] meet this goal.  How can you get more vitamin D?  Foods that contain vitamin D include:  ?? Salmon, tuna, and mackerel. These are some of the best foods to eat when you need to get more vitamin D.  ?? Cheese, egg yolks, and beef liver. These foods have vitamin D in small amounts.  ?? Milk, soy drinks, orange juice, yogurt, margarine, and some kinds of cereal have vitamin D added to them.  Some people don't make vitamin D as well as others. They may have to take extra care in getting enough vitamin D.  Things that reduce how much vitamin D your body makes include:  ?? Dark skin, such as many African Americans have.  ?? Age,  especially if you are older than 65.  ?? Digestive problems, such as Crohn's or celiac disease.  ?? Liver and kidney disease.  Some people who do not get enough vitamin D may need supplements.  Are there any risks from taking vitamin D?  ?? Too much vitamin D:  ?? Can damage your kidneys.   ?? Can cause nausea and vomiting, constipation, and weakness.  ?? Raises the amount of calcium in your blood. If this happens, you can get confused or have an irregular heart rhythm.  ?? Vitamin D may interact with other medicines. Tell your doctor about all of the medicines you take, including over-the-counter drugs, herbs, and pills. Tell your doctor about all of your current medical problems.  Where can you learn more?  Go to [employer]  Enter V530 in the search box to learn more about "Learning About Vitamin D."  ?? 2006-2016 [employer]. Care instructions adapted under license by [employer] (which disclaims liability or warranty for this information). This care instruction is for use with your licensed healthcare professional. If you have questions about a medical condition or this instruction, always ask your healthcare professional. Healthwise, Incorporated disclaims any warranty or liability for your use of this information.  Content Version: 10.9.538570; Current as of: June 17, 2014            Leiliana Foody L. Konrad Dolores, PA-C

## 2015-02-13 NOTE — Patient Instructions (Signed)
Learning About Vitamin D  Why is it important to get enough vitamin D?  Your body needs vitamin D to absorb calcium. Calcium keeps your bones and muscles, including your heart, healthy and strong. If your muscles don't get enough calcium, they can cramp, hurt, or feel weak. You may have long-term (chronic) muscle aches and pains.  If you don't get enough vitamin D throughout life, you have an increased chance of having thin and brittle bones (osteoporosis) in your later years. Children who don't get enough vitamin D may not grow as much as others their age. They also have a chance of getting a rare disease called rickets. It causes weak bones.  Vitamin D and calcium are added to many foods. And your body uses sunshine to make its own vitamin D.  How much vitamin D do you need?  The Institute of Medicine recommends that people ages 1 through 70 get 600 IU (international units) every day. Adults 71 and older need 800 IU every day.  Blood tests for vitamin D can check your vitamin D level. But there is no standard normal range used by all laboratories. The Institute of Medicine recommends a blood level of 20 ng/mL of vitamin D for healthy bones. And most people in the [place] and [place] meet this goal.  How can you get more vitamin D?  Foods that contain vitamin D include:  ?? Salmon, tuna, and mackerel. These are some of the best foods to eat when you need to get more vitamin D.  ?? Cheese, egg yolks, and beef liver. These foods have vitamin D in small amounts.  ?? Milk, soy drinks, orange juice, yogurt, margarine, and some kinds of cereal have vitamin D added to them.  Some people don't make vitamin D as well as others. They may have to take extra care in getting enough vitamin D.  Things that reduce how much vitamin D your body makes include:  ?? Dark skin, such as many African Americans have.  ?? Age, especially if you are older than 65.  ?? Digestive problems, such as Crohn's or celiac disease.   ?? Liver and kidney disease.  Some people who do not get enough vitamin D may need supplements.  Are there any risks from taking vitamin D?  ?? Too much vitamin D:  ?? Can damage your kidneys.  ?? Can cause nausea and vomiting, constipation, and weakness.  ?? Raises the amount of calcium in your blood. If this happens, you can get confused or have an irregular heart rhythm.  ?? Vitamin D may interact with other medicines. Tell your doctor about all of the medicines you take, including over-the-counter drugs, herbs, and pills. Tell your doctor about all of your current medical problems.  Where can you learn more?  Go to http://www.healthwise.net/[employer]  Enter V530 in the search box to learn more about "Learning About Vitamin D."  ?? 2006-2016 Healthwise, Inc[employer]. Care instructions adapted under license by [employer] (which disclaims liability or warranty for this information). This care instruction is for use with your licensed healthcare professional. If you have questions about a medical condition or this instruction, always ask your healthcare professional. Healthwise, Incorporated disclaims any warranty or liability for your use of this information.  Content Version: 10.9.538570; Current as of: June 17, 2014

## 2015-03-08 ENCOUNTER — Encounter: Attending: Medical | Primary: Medical

## 2015-10-23 ENCOUNTER — Ambulatory Visit (INDEPENDENT_AMBULATORY_CARE_PROVIDER_SITE_OTHER): Payer: BLUE CROSS/BLUE SHIELD | Admitting: Nurse Practitioner

## 2015-10-23 ENCOUNTER — Encounter: Payer: Self-pay | Admitting: Nurse Practitioner

## 2015-10-23 VITALS — BP 124/76 | HR 71 | Temp 98.1°F | Resp 14 | Ht 66.0 in | Wt 220.4 lb

## 2015-10-23 DIAGNOSIS — E559 Vitamin D deficiency, unspecified: Secondary | ICD-10-CM | POA: Diagnosis not present

## 2015-10-23 DIAGNOSIS — K259 Gastric ulcer, unspecified as acute or chronic, without hemorrhage or perforation: Secondary | ICD-10-CM

## 2015-10-23 DIAGNOSIS — I82413 Acute embolism and thrombosis of femoral vein, bilateral: Secondary | ICD-10-CM

## 2015-10-23 DIAGNOSIS — K219 Gastro-esophageal reflux disease without esophagitis: Secondary | ICD-10-CM | POA: Diagnosis not present

## 2015-10-23 DIAGNOSIS — R51 Headache: Secondary | ICD-10-CM

## 2015-10-23 DIAGNOSIS — Z9109 Other allergy status, other than to drugs and biological substances: Secondary | ICD-10-CM

## 2015-10-23 DIAGNOSIS — F418 Other specified anxiety disorders: Secondary | ICD-10-CM | POA: Diagnosis not present

## 2015-10-23 DIAGNOSIS — R519 Headache, unspecified: Secondary | ICD-10-CM

## 2015-10-23 DIAGNOSIS — Z7689 Persons encountering health services in other specified circumstances: Secondary | ICD-10-CM

## 2015-10-23 DIAGNOSIS — Z7189 Other specified counseling: Secondary | ICD-10-CM

## 2015-10-23 DIAGNOSIS — Z91048 Other nonmedicinal substance allergy status: Secondary | ICD-10-CM

## 2015-10-23 DIAGNOSIS — F4323 Adjustment disorder with mixed anxiety and depressed mood: Secondary | ICD-10-CM

## 2015-10-23 DIAGNOSIS — Z1211 Encounter for screening for malignant neoplasm of colon: Secondary | ICD-10-CM | POA: Insufficient documentation

## 2015-10-23 MED ORDER — CITALOPRAM HYDROBROMIDE 20 MG PO TABS: 20.0000 mg | ORAL_TABLET | Freq: Every day | ORAL | Status: DC

## 2015-10-23 MED ORDER — ESOMEPRAZOLE MAGNESIUM 40 MG PO CPDR: 40.0000 mg | DELAYED_RELEASE_CAPSULE | Freq: Every day | ORAL | Status: DC

## 2015-10-23 NOTE — Patient Instructions (Signed)
Nice to meet you! Welcome to [facility].   See you in 2 months.

## 2015-10-23 NOTE — Assessment & Plan Note (Addendum)
Discussed acute and chronic issues. Reviewed health maintenance measures, PFSHx, and immunizations. Obtain records from [facility] [place], [place] [address]

## 2015-10-23 NOTE — Progress Notes (Signed)
Patient ID: Michele Long, female    DOB: 08-May-1951  Age: 65 y.o. MRN: [id number]  CC: Establish Care and Depression   HPI Michele Long presents for establishing care and CC depression.   1) New Pt Info:   Colonoscopy- 2 in past   2) Chronic Problems-  Gastric ulcers history- no perforation   Frequent HA- tension and migraines   Allergies- seasonal   DVT- bilateral legs went through anticoagulation for 6 months and a laser procedure for vascular (2005 approx)   UTIs- intermittent in the past   3) Acute Problems-  Anxiety and depression- family members have passed (mother in 2012, son at 57 months, husband in 2014, sister-in law in 2014). Daughter has unknown health issues and she is getting aggravated often.    Taking celexa and prn xanax    Wellbutrin- not helpful   History Michele Long has a past medical history of Depression; GERD (gastroesophageal reflux disease); Frequent headaches; Allergy; DVT (deep venous thrombosis) ([place]); HTN (hypertension); Migraine; and UTI (lower urinary tract infection).   She has past surgical history that includes Nasal septum surgery (2000) and Breast enhancement surgery (1983).   Her family history includes Arthritis in her mother and paternal grandmother; Cancer in her paternal grandfather; Diabetes in her brother and maternal grandfather; Heart disease in her maternal grandfather.She reports that she has never smoked. She does not have any smokeless tobacco history on file. She reports that she does not drink alcohol or use illicit drugs.  No outpatient prescriptions prior to visit.   No facility-administered medications prior to visit.    ROS Review of Systems  Constitutional: Negative for fever, chills, diaphoresis and fatigue.  Respiratory: Negative for chest tightness, shortness of breath and wheezing.   Cardiovascular: Negative for chest pain, palpitations and leg swelling.  Gastrointestinal: Negative for nausea, vomiting and diarrhea.  Skin:  Negative for rash.  Neurological: Negative for dizziness, numbness and headaches.  Psychiatric/Behavioral: Positive for sleep disturbance. Negative for suicidal ideas, hallucinations and self-injury. The patient is nervous/anxious.        Depression    Objective:  BP 124/76 mmHg  Pulse 71  Temp(Src) 98.1 F (36.7 C) (Oral)  Resp 14  Ht 5\' 6"  (1.676 m)  Wt 220 lb 6.4 oz (99.973 kg)  BMI 35.59 kg/m2  SpO2 96%  Physical Exam  Constitutional: She is oriented to person, place, and time. She appears well-developed and well-nourished. No distress.  HENT:  Head: Normocephalic and atraumatic.  Right Ear: External ear normal.  Left Ear: External ear normal.  Eyes: Right eye exhibits no discharge. Left eye exhibits no discharge. No scleral icterus.  Neck: Normal range of motion.  Trap muscles are tight and tender bilaterally  Cardiovascular: Normal rate and regular rhythm.   Pulmonary/Chest: Effort normal and breath sounds normal. No respiratory distress. She has no wheezes. She has no rales. She exhibits no tenderness.  Lymphadenopathy:    She has no cervical adenopathy.  Neurological: She is alert and oriented to person, place, and time. No cranial nerve deficit. She exhibits normal muscle tone. Coordination normal.  Skin: Skin is warm and dry. No rash noted. She is not diaphoretic.  Psychiatric: She has a normal mood and affect. Her behavior is normal. Judgment and thought content normal.  Tearful when discussing past family members who have passed   Assessment & Plan:   Michele Long was seen today for establish care and depression.  Diagnoses and all orders for this visit:  Encounter to establish care  Depression with anxiety  Gastroesophageal reflux disease, esophagitis presence not specified  Vitamin D deficiency  Gastric ulcer, unspecified chronicity  Frequent headaches  Environmental allergies  Adjustment disorder with mixed anxiety and depressed mood  Acute deep vein  thrombosis (DVT) of femoral vein of both lower extremities (HCC)  Other orders -     citalopram (CELEXA) 20 MG tablet; Take 1 tablet (20 mg total) by mouth daily. -     esomeprazole (NEXIUM) 40 MG capsule; Take 1 capsule (40 mg total) by mouth daily at 12 noon.   I have changed Michele Long's citalopram and esomeprazole. I am also having her maintain her Vitamin D (Ergocalciferol), furosemide, tiZANidine, ALPRAZolam, Iron-Vitamins (GERITOL PO), and multivitamin.  Meds ordered this encounter  Medications  . DISCONTD: citalopram (CELEXA) 20 MG tablet    Sig:   . Vitamin D, Ergocalciferol, (DRISDOL) 50000 units CAPS capsule    Sig:   . furosemide (LASIX) 20 MG tablet    Sig: Take 20 mg by mouth daily.  Marland Kitchen DISCONTD: esomeprazole (NEXIUM) 40 MG capsule    Sig: Take 40 mg by mouth daily at 12 noon.  Marland Kitchen tiZANidine (ZANAFLEX) 4 MG capsule    Sig: Take 4 mg by mouth 3 (three) times daily as needed for muscle spasms.  . ALPRAZolam (XANAX) 0.5 MG tablet    Sig: Take 0.5 mg by mouth at bedtime as needed for anxiety.  . citalopram (CELEXA) 20 MG tablet    Sig: Take 1 tablet (20 mg total) by mouth daily.    Dispense:  30 tablet    Refill:  3    Order Specific Question:  Supervising Provider    Answer:  Deborra Medina L [[id number]]  . esomeprazole (NEXIUM) 40 MG capsule    Sig: Take 1 capsule (40 mg total) by mouth daily at 12 noon.    Dispense:  30 capsule    Refill:  3    Order Specific Question:  Supervising Provider    Answer:  Deborra Medina L [[id number]]  . Iron-Vitamins (GERITOL PO)    Sig: Take 1 tablet by mouth daily.  . Multiple Vitamin (MULTIVITAMIN) capsule    Sig: Take 1 capsule by mouth daily.     Follow-up: Return in about 2 months (around 12/23/2015) for Follow up .

## 2015-10-27 ENCOUNTER — Encounter: Payer: Self-pay | Admitting: Nurse Practitioner

## 2015-10-27 DIAGNOSIS — I82409 Acute embolism and thrombosis of unspecified deep veins of unspecified lower extremity: Secondary | ICD-10-CM | POA: Insufficient documentation

## 2015-10-27 DIAGNOSIS — K259 Gastric ulcer, unspecified as acute or chronic, without hemorrhage or perforation: Secondary | ICD-10-CM | POA: Insufficient documentation

## 2015-10-27 DIAGNOSIS — F4323 Adjustment disorder with mixed anxiety and depressed mood: Secondary | ICD-10-CM | POA: Insufficient documentation

## 2015-10-27 DIAGNOSIS — Z9109 Other allergy status, other than to drugs and biological substances: Secondary | ICD-10-CM | POA: Insufficient documentation

## 2015-10-27 DIAGNOSIS — R519 Headache, unspecified: Secondary | ICD-10-CM | POA: Insufficient documentation

## 2015-10-27 DIAGNOSIS — R51 Headache: Secondary | ICD-10-CM

## 2015-10-27 HISTORY — DX: Gastric ulcer, unspecified as acute or chronic, without hemorrhage or perforation: K25.9

## 2015-10-27 NOTE — Assessment & Plan Note (Signed)
History of- Need to obtain records No recent NSAIDs Lots of stress- discussed continuing nexium

## 2015-10-27 NOTE — Assessment & Plan Note (Signed)
New problem to me  Celexa pt feels that is is somewhat helpful Xanax prn Pt is taking care of sick daughter and elderly father and feels stretched and stressed. Discussed non-medication ways to deal with stress and sadness. Pt declines counseling due to time constraints, but willing to consider in the near future. Asked her to let me know. FU in 2 months or sooner if needed

## 2015-10-27 NOTE — Assessment & Plan Note (Signed)
Pt reports lots of recent stress  Discussed small meals, watching triggers, and continuing nexium

## 2015-10-27 NOTE — Assessment & Plan Note (Signed)
Stable at this time OTC measures helpful

## 2015-10-27 NOTE — Assessment & Plan Note (Signed)
Stress induced likely  New problem to me  Discussed tightness of trap muscles bilaterally leading to tension HA Migraines- OTC meds helpful

## 2015-10-27 NOTE — Assessment & Plan Note (Signed)
Taking weekly vitamin D and a multivitamin

## 2015-10-27 NOTE — Assessment & Plan Note (Signed)
History of- Need records Pt pointed to upper thighs bilaterally Pt reports 6 mos of anticoag therapy in 2005 approx

## 2015-10-30 ENCOUNTER — Encounter: Payer: Self-pay | Admitting: Nurse Practitioner

## 2015-12-27 ENCOUNTER — Ambulatory Visit (INDEPENDENT_AMBULATORY_CARE_PROVIDER_SITE_OTHER): Payer: BLUE CROSS/BLUE SHIELD | Admitting: Family Medicine

## 2015-12-27 ENCOUNTER — Encounter: Payer: Self-pay | Admitting: Family Medicine

## 2015-12-27 ENCOUNTER — Ambulatory Visit: Payer: BLUE CROSS/BLUE SHIELD | Admitting: Nurse Practitioner

## 2015-12-27 VITALS — BP 136/84 | HR 93 | Temp 98.5°F | Ht 66.0 in | Wt 224.6 lb

## 2015-12-27 DIAGNOSIS — E669 Obesity, unspecified: Secondary | ICD-10-CM

## 2015-12-27 DIAGNOSIS — F418 Other specified anxiety disorders: Secondary | ICD-10-CM | POA: Diagnosis not present

## 2015-12-27 DIAGNOSIS — K219 Gastro-esophageal reflux disease without esophagitis: Secondary | ICD-10-CM

## 2015-12-27 NOTE — Assessment & Plan Note (Signed)
Well-controlled with Nexium. Previously had EGD with no evidence of ulcer per her report. Continue Nexium. Continue to monitor.

## 2015-12-27 NOTE — Assessment & Plan Note (Signed)
Discussed diet and exercise at length. Patient chose 12/28/15 is her day to start walking. Discussed doing this 2-3 days a week to start with and then increasing as tolerated. Discussed dietary changes including adding more vegetables, decreasing starch intake, decreasing sweet tea intake. We'll see her back in a month.

## 2015-12-27 NOTE — Progress Notes (Signed)
Patient ID: Michele Long, female   DOB: [birth year], 65 y.o.  Michele Long MRN: [id number]  Michele AlarEric Chenae Brager, MD Phone: [phone]  Michele Long is a 65 y.o. female who presents today for follow-up.  Anxiety/depression: Patient notes that this would get significantly better if we could sort out what was wrong with her daughter. Reports a daughter has an appointment with me in 2 weeks. The daughter has had right upper quadrant discomfort for about 5 years. Patient has been on Celexa and Xanax. Only takes Xanax once a month. No SI or HI. Has thought about seeing a counselor and has in the past though none recently.  Depression screen PHQ 2/9 12/27/2015  Decreased Interest 2  Down, Depressed, Hopeless 1  PHQ - 2 Score 3  Altered sleeping 2  Tired, decreased energy 3  Change in appetite 3  Feeling bad or failure about yourself  2  Trouble concentrating 2  Moving slowly or fidgety/restless 0  Suicidal thoughts 0  PHQ-9 Score 15  Difficult doing work/chores Very difficult   GAD 7 : Generalized Anxiety Score 12/27/2015  Nervous, Anxious, on Edge 2  Control/stop worrying 1  Worry too much - different things 2  Trouble relaxing 1  Restless 0  Easily annoyed or irritable 2  Afraid - awful might happen 0  Total GAD 7 Score 8  Anxiety Difficulty Very difficult    Obesity: She has not been working on diet or exercise recently. Had been eating a lot of pasta. Notes her exercise consists of short walks for her dogs go to the bathroom 4 times a day. She wants to start exercising. She will start walking tomorrow. She is going to increase vegetables. Eats lots of chicken. Little red meat. Some sweet tea every day.  GERD: No symptoms if she takes is her Nexium. If she doesn't within 2 days she has burning and sour taste. No blood in her stool. Had an EGD 3 years ago and was advised she did not have an ulcer.  PMH: nonsmoker.   ROS see history of present illness  Objective  Physical Exam Filed Vitals:   12/27/15 1436  BP: 136/84  Pulse: 93  Temp: 98.5 F (36.9 C)    BP Readings from Last 3 Encounters:  12/27/15 136/84  10/23/15 124/76   Wt Readings from Last 3 Encounters:  12/27/15 224 lb 9.6 oz (101.878 kg)  10/23/15 220 lb 6.4 oz (99.973 kg)    Physical Exam  Constitutional: She is well-developed, well-nourished, and in no distress.  HENT:  Head: Normocephalic and atraumatic.  Right Ear: External ear normal.  Left Ear: External ear normal.  Cardiovascular: Normal rate, regular rhythm and normal heart sounds.   Pulmonary/Chest: Effort normal and breath sounds normal.  Abdominal: Soft. Bowel sounds are normal. She exhibits no distension. There is no tenderness. There is no rebound and no guarding.  Neurological: She is alert. Gait normal.  Skin: Skin is warm and dry. She is not diaphoretic.  Psychiatric:  Mood depressed, affect intermittently tearful, intermittently smiling     Assessment/Plan: Please see individual problem list.  Depression with anxiety Somewhat worse. Notes this could be better if her daughter's health issues to be resolved. Has been on Celexa and Xanax for a number of years. Not interested in medication changes at this time and wants to try it exercise to see if that will help. Offered counseling. She'll return in one month. Given return precautions.  GERD (gastroesophageal reflux disease) Well-controlled with Nexium. Previously had  EGD with no evidence of ulcer per her report. Continue Nexium. Continue to monitor.  Obesity Discussed diet and exercise at length. Patient chose 12/28/15 is her day to start walking. Discussed doing this 2-3 days a week to start with and then increasing as tolerated. Discussed dietary changes including adding more vegetables, decreasing starch intake, decreasing sweet tea intake. We'll see her back in a month.    Tommi Rumps, MD [place]

## 2015-12-27 NOTE — Patient Instructions (Signed)
Nice to meet you. Please continue her Celexa and Xanax as needed. Please continue your Nexium for your reflux. Please work on your diet by adding more vegetables, decreasing sweet tea intake and decreasing starch intake. Please start walking as we discussed tomorrow. If you develop worsening reflux, worsening anxiety or depression, thoughts of harming your self or others, or any new or changing symptoms please seek medical attention.

## 2015-12-27 NOTE — Assessment & Plan Note (Signed)
Somewhat worse. Notes this could be better if her daughter's health issues to be resolved. Has been on Celexa and Xanax for a number of years. Not interested in medication changes at this time and wants to try it exercise to see if that will help. Offered counseling. She'll return in one month. Given return precautions.

## 2015-12-27 NOTE — Progress Notes (Signed)
Pre visit review using our clinic review tool, if applicable. No additional management support is needed unless otherwise documented below in the visit note. 

## 2016-01-29 ENCOUNTER — Ambulatory Visit (INDEPENDENT_AMBULATORY_CARE_PROVIDER_SITE_OTHER): Payer: BLUE CROSS/BLUE SHIELD | Admitting: Family Medicine

## 2016-01-29 ENCOUNTER — Encounter: Payer: Self-pay | Admitting: Family Medicine

## 2016-01-29 VITALS — BP 136/88 | HR 86 | Temp 98.8°F | Ht 66.0 in | Wt 225.4 lb

## 2016-01-29 DIAGNOSIS — R600 Localized edema: Secondary | ICD-10-CM

## 2016-01-29 DIAGNOSIS — F418 Other specified anxiety disorders: Secondary | ICD-10-CM | POA: Diagnosis not present

## 2016-01-29 DIAGNOSIS — E669 Obesity, unspecified: Secondary | ICD-10-CM

## 2016-01-29 NOTE — Progress Notes (Signed)
Patient ID: Michele Long, female   DOB: 1950-10-07, 65 y.o.   MRN: [id number]  Tommi Rumps, MD Phone: [phone]  Michele Long is a 65 y.o. female who presents today for follow-up.  Depression/anxiety: Patient notes this is 70% better. Taking Celexa. Has not required Xanax. Notes her daughter's medical situation makes her anxious. No SI.  Obesity: Patient has been walking 1 mile per day. Does this 4 days a week. Has decrease we 2-3 days a week. Not eating as much sweets. Note she is moving to a new house in a month and will likely not be able to change anything further until after the move. At that time she will start walking more start cooking more at home.  Bilateral lower extremity swelling: Patient notes she has had swelling in her legs for entire life. Did have DVTs in bilateral lower extremities previously though notes the swelling returned back to what it was previously. No pain in her legs. No orthopnea or PND. No shortness of breath. Reports having had lab work just over a year ago.  PMH: nonsmoker.   ROS see history of present illness  Objective  Physical Exam Filed Vitals:   01/29/16 1417  BP: 136/88  Pulse: 86  Temp: 98.8 F (37.1 C)    BP Readings from Last 3 Encounters:  01/29/16 136/88  12/27/15 136/84  10/23/15 124/76   Wt Readings from Last 3 Encounters:  01/29/16 225 lb 6.4 oz (102.241 kg)  12/27/15 224 lb 9.6 oz (101.878 kg)  10/23/15 220 lb 6.4 oz (99.973 kg)    Physical Exam  Constitutional: She is well-developed, well-nourished, and in no distress.  HENT:  Head: Normocephalic and atraumatic.  Right Ear: External ear normal.  Left Ear: External ear normal.  Cardiovascular: Normal rate, regular rhythm and normal heart sounds.   Pedal edema noted, no edema in her calves or lower extremities other than her feet  Pulmonary/Chest: Effort normal and breath sounds normal.  Neurological: She is alert. Gait normal.  Skin: Skin is warm and dry. She is  not diaphoretic.     Assessment/Plan: Please see individual problem list.  Depression with anxiety Significantly improved. She'll continue current Celexa dosing. Xanax as needed. Given return precautions.  Obesity Weight relatively stable. Has made good strides to start with diet and exercise. Discussed continuing diet and exercise changes. She is moving in the next month and I advised that she could maintain what she is doing now and proceed with changes after she moves. We'll see her back in 2 months.  Bilateral lower extremity edema Chronic bilateral lower extremity edema. Suspect venous insufficiency. Doubt CHF given lifelong issue and lack of symptoms. We will obtain lab work to rule out other causes. She'll continue to monitor. Given return precautions.    Orders Placed This Encounter  Procedures  . Lipid Profile    Standing Status: Future     Number of Occurrences:      Standing Expiration Date: 01/29/2017  . HgB A1c    Standing Status: Future     Number of Occurrences:      Standing Expiration Date: 01/29/2017  . Comp Met (CMET)    Standing Status: Future     Number of Occurrences:      Standing Expiration Date: 01/29/2017  . TSH    Standing Status: Future     Number of Occurrences:      Standing Expiration Date: 01/29/2017  . CBC    Standing Status: Future  Number of Occurrences:      Standing Expiration Date: 01/29/2017    Tommi Rumps, MD [place]

## 2016-01-29 NOTE — Patient Instructions (Signed)
Nice to see you. Please continue your Celexa and monitor your anxiety and depression. If these worsen please let us know. We will have you maintain your current weight loss strategies until after you move into your new house. At that point please start increasing her walking and continued work on her diet. If you develop thoughts of harming herself or others please seek medical attention.

## 2016-01-29 NOTE — Progress Notes (Signed)
Pre visit review using our clinic review tool, if applicable. No additional management support is needed unless otherwise documented below in the visit note. 

## 2016-01-30 DIAGNOSIS — R600 Localized edema: Secondary | ICD-10-CM | POA: Insufficient documentation

## 2016-01-30 NOTE — Assessment & Plan Note (Signed)
Significantly improved. She'll continue current Celexa dosing. Xanax as needed. Given return precautions.

## 2016-01-30 NOTE — Assessment & Plan Note (Signed)
Chronic bilateral lower extremity edema. Suspect venous insufficiency. Doubt CHF given lifelong issue and lack of symptoms. We will obtain lab work to rule out other causes. She'll continue to monitor. Given return precautions.

## 2016-01-30 NOTE — Assessment & Plan Note (Signed)
Weight relatively stable. Has made good strides to start with diet and exercise. Discussed continuing diet and exercise changes. She is moving in the next month and I advised that she could maintain what she is doing now and proceed with changes after she moves. We'll see her back in 2 months.

## 2016-02-13 ENCOUNTER — Other Ambulatory Visit: Payer: Self-pay | Admitting: Family Medicine

## 2016-02-20 ENCOUNTER — Other Ambulatory Visit: Payer: BLUE CROSS/BLUE SHIELD

## 2016-03-15 ENCOUNTER — Telehealth: Payer: Self-pay | Admitting: *Deleted

## 2016-03-15 MED ORDER — CITALOPRAM HYDROBROMIDE 40 MG PO TABS: 40.0000 mg | ORAL_TABLET | Freq: Every day | ORAL | 3 refills | Status: DC

## 2016-03-15 MED ORDER — RANITIDINE HCL 150 MG PO TABS: 150.0000 mg | ORAL_TABLET | Freq: Two times a day (BID) | ORAL | 1 refills | Status: DC

## 2016-03-15 NOTE — Telephone Encounter (Signed)
Patient has been informed.

## 2016-03-15 NOTE — Telephone Encounter (Signed)
Is it ok to refill her celexa? Please advise

## 2016-03-15 NOTE — Telephone Encounter (Signed)
Patient states she would like to start taking 1 1/2 tablets of her Celexa if Dr. Caryl Bis thinks that would be ok. Patient states she doesn't have enough for this month to do that if he is ok with it. She will need a new RX sent to the pharmacy. She also want a referral to see a psychologist. Patient is requesting a call back if you have any questions. Her number is  [phone]. Thanks

## 2016-03-15 NOTE — Telephone Encounter (Signed)
Please determine if patient is on Nexium. If she is taking Nexium the recommended maximum dose of Celexa is 20 mg. If this is the case she has 2 options. First one will be changing the Celexa to alternative medication. The second wound be discontinuing Nexium and replacing it with a medicine like Zantac.

## 2016-03-15 NOTE — Telephone Encounter (Signed)
Patient would like to change her Nexium to Zantac.  Please advise.

## 2016-03-15 NOTE — Telephone Encounter (Signed)
Zantac sent to pharmacy. We will have her increase her dose of Celexa to 40 mg daily. If she develops worsening reflux symptoms or stomach discomfort she should let us know given the change from Nexium to Zantac and her history of ulcers.

## 2016-04-03 ENCOUNTER — Ambulatory Visit: Payer: BLUE CROSS/BLUE SHIELD | Admitting: Family Medicine

## 2016-04-04 ENCOUNTER — Other Ambulatory Visit (INDEPENDENT_AMBULATORY_CARE_PROVIDER_SITE_OTHER): Payer: BLUE CROSS/BLUE SHIELD

## 2016-04-04 ENCOUNTER — Encounter (INDEPENDENT_AMBULATORY_CARE_PROVIDER_SITE_OTHER): Payer: Self-pay

## 2016-04-04 DIAGNOSIS — R600 Localized edema: Secondary | ICD-10-CM | POA: Diagnosis not present

## 2016-04-04 DIAGNOSIS — E669 Obesity, unspecified: Secondary | ICD-10-CM

## 2016-04-04 LAB — LIPID PANEL
CHOL/HDL RATIO: 3
Cholesterol: 174 mg/dL (ref 0–200)
HDL: 50.4 mg/dL (ref >39.00)
LDL CALC: 97 mg/dL (ref 0–99)
NonHDL: 123.96
Triglycerides: 133 mg/dL (ref 0.0–149.0)
VLDL: 26.6 mg/dL (ref 0.0–40.0)

## 2016-04-04 LAB — CBC
HCT: 38.1 % (ref 36.0–46.0)
HEMOGLOBIN: 12.6 g/dL (ref 12.0–15.0)
MCHC: 33.2 g/dL (ref 30.0–36.0)
MCV: 80.6 fl (ref 78.0–100.0)
PLATELETS: 240 10*3/uL (ref 150.0–400.0)
RBC: 4.73 Mil/uL (ref 3.87–5.11)
RDW: 15.4 % (ref 11.5–15.5)
WBC: 6.7 10*3/uL (ref 4.0–10.5)

## 2016-04-04 LAB — COMPREHENSIVE METABOLIC PANEL
ALT: 14 U/L (ref 0–35)
AST: 16 U/L (ref 0–37)
Albumin: 4 g/dL (ref 3.5–5.2)
Alkaline Phosphatase: 49 U/L (ref 39–117)
BUN: 15 mg/dL (ref 6–23)
CHLORIDE: 106 meq/L (ref 96–112)
CO2: 28 mEq/L (ref 19–32)
Calcium: 9.3 mg/dL (ref 8.4–10.5)
Creatinine, Ser: 0.94 mg/dL (ref 0.40–1.20)
GFR: 63.74 mL/min (ref >60.00)
GLUCOSE: 96 mg/dL (ref 70–99)
POTASSIUM: 4.2 meq/L (ref 3.5–5.1)
SODIUM: 139 meq/L (ref 135–145)
Total Bilirubin: 1.2 mg/dL (ref 0.2–1.2)
Total Protein: 6.7 g/dL (ref 6.0–8.3)

## 2016-04-04 LAB — HEMOGLOBIN A1C: HEMOGLOBIN A1C: 6 % (ref 4.6–6.5)

## 2016-04-04 LAB — TSH: TSH: 2.67 u[IU]/mL (ref 0.35–4.50)

## 2016-04-09 ENCOUNTER — Encounter: Payer: Self-pay | Admitting: Family Medicine

## 2016-05-20 ENCOUNTER — Other Ambulatory Visit: Payer: Self-pay | Admitting: Family Medicine

## 2016-05-23 ENCOUNTER — Ambulatory Visit: Payer: BLUE CROSS/BLUE SHIELD | Admitting: Family Medicine

## 2016-06-12 ENCOUNTER — Ambulatory Visit (INDEPENDENT_AMBULATORY_CARE_PROVIDER_SITE_OTHER): Payer: BLUE CROSS/BLUE SHIELD | Admitting: Surgical

## 2016-06-12 DIAGNOSIS — Z23 Encounter for immunization: Secondary | ICD-10-CM | POA: Diagnosis not present

## 2016-07-09 ENCOUNTER — Ambulatory Visit
Admission: RE | Admit: 2016-07-09 | Discharge: 2016-07-09 | Disposition: A | Discharge status: Home / Self Care | Payer: BLUE CROSS/BLUE SHIELD | Source: Ambulatory Visit | Attending: Family Medicine | Admitting: Family Medicine

## 2016-07-09 ENCOUNTER — Encounter: Payer: Self-pay | Admitting: Family Medicine

## 2016-07-09 ENCOUNTER — Ambulatory Visit (INDEPENDENT_AMBULATORY_CARE_PROVIDER_SITE_OTHER): Payer: BLUE CROSS/BLUE SHIELD | Admitting: Family Medicine

## 2016-07-09 VITALS — BP 152/84 | HR 66 | Temp 98.4°F | Wt 220.2 lb

## 2016-07-09 DIAGNOSIS — F418 Other specified anxiety disorders: Secondary | ICD-10-CM

## 2016-07-09 DIAGNOSIS — R079 Chest pain, unspecified: Secondary | ICD-10-CM | POA: Diagnosis not present

## 2016-07-09 DIAGNOSIS — I700 Atherosclerosis of aorta: Secondary | ICD-10-CM | POA: Insufficient documentation

## 2016-07-09 DIAGNOSIS — I2510 Atherosclerotic heart disease of native coronary artery without angina pectoris: Secondary | ICD-10-CM | POA: Insufficient documentation

## 2016-07-09 DIAGNOSIS — M7989 Other specified soft tissue disorders: Secondary | ICD-10-CM | POA: Diagnosis not present

## 2016-07-09 DIAGNOSIS — K219 Gastro-esophageal reflux disease without esophagitis: Secondary | ICD-10-CM | POA: Diagnosis not present

## 2016-07-09 DIAGNOSIS — F329 Major depressive disorder, single episode, unspecified: Secondary | ICD-10-CM

## 2016-07-09 DIAGNOSIS — R0789 Other chest pain: Secondary | ICD-10-CM

## 2016-07-09 DIAGNOSIS — E6609 Other obesity due to excess calories: Secondary | ICD-10-CM

## 2016-07-09 DIAGNOSIS — F419 Anxiety disorder, unspecified: Secondary | ICD-10-CM

## 2016-07-09 DIAGNOSIS — F32A Depression, unspecified: Secondary | ICD-10-CM

## 2016-07-09 LAB — POCT I-STAT CREATININE: CREATININE: 1 mg/dL (ref 0.44–1.00)

## 2016-07-09 MED ORDER — PANTOPRAZOLE SODIUM 40 MG PO TBEC: 40.0000 mg | DELAYED_RELEASE_TABLET | Freq: Every day | ORAL | 3 refills | Status: DC

## 2016-07-09 MED ORDER — IOPAMIDOL (ISOVUE-370) INJECTION 76%
75.0000 mL | Freq: Once | INTRAVENOUS | Status: AC | PRN
Start: 2016-07-09 — End: 2016-07-09
  Administered 2016-07-09: 75 mL via INTRAVENOUS

## 2016-07-09 NOTE — Assessment & Plan Note (Signed)
Worsened recently. No SI or HI. On Celexa. Offered augmentation with medication versus referral to psychologist. She opted for psychology referral. This will be placed. She is given return precautions.

## 2016-07-09 NOTE — Assessment & Plan Note (Signed)
Has not been able to work on diet and exercise very much recently. Encouraged her to continue to do this.

## 2016-07-09 NOTE — Progress Notes (Signed)
Pre visit review using our clinic review tool, if applicable. No additional management support is needed unless otherwise documented below in the visit note. 

## 2016-07-09 NOTE — Assessment & Plan Note (Signed)
Patient's chest discomfort symptoms could be related to reflux. EKG done today with some nonspecific findings. With lower extremity swelling left greater than right and chest pain the concern is also for VTE as a cause. CT angiogram and bilateral lower extremity ultrasounds will be ordered to evaluate given wells score of 4.5. We will change her to Protonix for her reflux. We'll refer to cardiology for evaluation of the nonspecific T-wave findings. She is given return precautions.

## 2016-07-09 NOTE — Progress Notes (Signed)
Michele Rumps, MD Phone: [phone]  [name] is a 65 y.o. female who presents today for f/u.  Depression: notes this has not been good recently. The increased dose of celexa did help some though she has been under a lot of stress recently. No SI or HI. Not taking xanax often.  Has not been able to work on exercise very much. Has been watching her sweet intake.  Patient additionally notes her reflux has gotten worse. She notes 3 episodes of centralized chest discomfort described as sharp discomfort lasting from a few minutes to a longer period of time. Most recently 3 days ago. She called EMS and they evaluated her and recommended ED evaluation, though she felt the discomfort was related to her reflux as it improved and she was not evaluated. States they did an EKG and noted it looked normal. She took 5 baby aspirin prior to EMS getting there and notes her pain improved with that. She notes the pain radiated to her back with this. No other radiation. No dyspnea or diaphoresis. Does note her bilateral legs have swollen and the left one has been worse than usual. 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 She notes no chest pain or shortness of breath at this time. Does note an area of pain and tenderness in her left calf. No recent surgeries, trips, or history of cancer.   PMH: nonsmoker.   ROS see history of present illness  Objective  Physical Exam Vitals:   07/09/16 0835 07/09/16 1004  BP: (!) 160/80 (!) 152/84  Pulse: 66   Temp: 98.4 F (36.9 C)     BP Readings from Last 3 Encounters:  07/09/16 (!) 152/84  01/29/16 136/88  12/27/15 136/84   Wt Readings from Last 3 Encounters:  07/09/16 220 lb 3.2 oz (99.9 kg)  01/29/16 225 lb 6.4 oz (102.2 kg)  12/27/15 224 lb 9.6 oz (101.9 kg)    Physical Exam  Constitutional: No distress.  Cardiovascular: Normal rate, regular rhythm and normal heart sounds.   Pulmonary/Chest: Effort normal and breath sounds normal.    Musculoskeletal:  Bilateral LE with edema, left calf 45.5 cm, right calf 42.5 cm, tenderness noted along the posteromedial aspect of the left calf, no tenderness in right calf  Neurological: She is alert. Gait normal.  Skin: She is not diaphoretic.  Psychiatric:  Mood depressed and anxious, affect depressed and intermittently tearful   EKG: Normal sinus rhythm, rate 63, inverted T waves in lead 3  Assessment/Plan: Please see individual problem list.  GERD (gastroesophageal reflux disease) Patient's chest discomfort symptoms could be related to reflux. EKG done today with some nonspecific findings. With lower extremity swelling left greater than right and chest pain the concern is also for VTE as a cause. CT angiogram and bilateral lower extremity ultrasounds will be ordered to evaluate given wells score of 4.5. We will change her to Protonix for her reflux. We'll refer to cardiology for evaluation of the nonspecific T-wave findings. She is given return precautions.  Obesity Has not been able to work on diet and exercise very much recently. Encouraged her to continue to do this.  Depression with anxiety Worsened recently. No SI or HI. On Celexa. Offered augmentation with medication versus referral to psychologist. She opted for psychology referral. This will be placed. She is given return precautions.   Orders Placed This Encounter  Procedures  . US Venous Img Lower Bilateral    Standing Status:   Future  Number of Occurrences:   1    Standing Expiration Date:   09/09/2017    Order Specific Question:   Reason for Exam (SYMPTOM  OR DIAGNOSIS REQUIRED)    Answer:   bilateral LE swelling, R>L, history bilateral DVT    Order Specific Question:   Preferred imaging location?    Answer:   [facility]    Order Specific Question:   Call Results- Best Contact Number?    Answer:   [phone] please hold patient    . CT Angio Chest W/Cm &/Or Wo Cm    Standing Status:   Future     Number of Occurrences:   1    Standing Expiration Date:   10/07/2017    Order Specific Question:   If indicated for the ordered procedure, I authorize the administration of contrast media per Radiology protocol    Answer:   Yes    Order Specific Question:   Reason for Exam (SYMPTOM  OR DIAGNOSIS REQUIRED)    Answer:   recent history of chest pain, left leg swelling, history of bilateral DVT    Order Specific Question:   Preferred imaging location?    Answer:   [facility]    Order Specific Question:   Call Results- Best Contact Number?    Answer:   [phone] please hold patient   . Creatinine    Standing Status:   Future    Standing Expiration Date:   07/09/2017  . Ambulatory referral to Psychology    Referral Priority:   Routine    Referral Type:   Psychiatric    Referral Reason:   Specialty Services Required    Requested Specialty:   Psychology    Number of Visits Requested:   1  . EKG 12-Lead    Meds ordered this encounter  Medications  . pantoprazole (PROTONIX) 40 MG tablet    Sig: Take 1 tablet (40 mg total) by mouth daily.    Dispense:  30 tablet    Refill:  [place], MD [place]

## 2016-07-09 NOTE — Patient Instructions (Signed)
Nice to see you. We are going to change you to Protonix for reflux. We are going to obtain ultrasounds of your legs and a CT scan of your chest to evaluate for blood clot. If you develop chest pain, shortness of breath, pain in her leg, thoughts of harming herself or others, or any new or changing symptoms please seek medical attention immediately.

## 2016-07-10 ENCOUNTER — Other Ambulatory Visit: Payer: Self-pay | Admitting: Family Medicine

## 2016-08-01 ENCOUNTER — Ambulatory Visit: Payer: BLUE CROSS/BLUE SHIELD | Admitting: Cardiology

## 2016-08-08 ENCOUNTER — Ambulatory Visit: Payer: BLUE CROSS/BLUE SHIELD | Admitting: Cardiology

## 2016-08-13 ENCOUNTER — Ambulatory Visit (INDEPENDENT_AMBULATORY_CARE_PROVIDER_SITE_OTHER): Payer: 59 | Admitting: Cardiology

## 2016-08-13 ENCOUNTER — Encounter (INDEPENDENT_AMBULATORY_CARE_PROVIDER_SITE_OTHER): Payer: Self-pay

## 2016-08-13 ENCOUNTER — Encounter: Payer: Self-pay | Admitting: Cardiology

## 2016-08-13 VITALS — BP 144/74 | HR 72 | Ht 65.0 in | Wt 220.8 lb

## 2016-08-13 DIAGNOSIS — E6609 Other obesity due to excess calories: Secondary | ICD-10-CM

## 2016-08-13 DIAGNOSIS — Z6836 Body mass index (BMI) 36.0-36.9, adult: Secondary | ICD-10-CM | POA: Diagnosis not present

## 2016-08-13 DIAGNOSIS — R079 Chest pain, unspecified: Secondary | ICD-10-CM | POA: Diagnosis not present

## 2016-08-13 DIAGNOSIS — R030 Elevated blood-pressure reading, without diagnosis of hypertension: Secondary | ICD-10-CM | POA: Diagnosis not present

## 2016-08-13 NOTE — Patient Instructions (Addendum)
Medication Instructions:  Please continue your current medications  Labwork: None  Testing/Procedures: Your physician has requested that you have an echocardiogram. Echocardiography is a painless test that uses sound waves to create images of your heart. It provides your doctor with information about the size and shape of your heart and how well your heart's chambers and valves are working. This procedure takes approximately one hour. There are no restrictions for this procedure.  Your physician has requested that you have a stress echocardiogram. For further information please visit [email or web address]. Please follow instruction sheet as given.  Follow-Up: Call or return to clinic prn if these symptoms worsen or fail to improve as anticipated.  If you need a refill on your cardiac medications before your next appointment, please call your pharmacy.  Echocardiogram An echocardiogram, or echocardiography, uses sound waves (ultrasound) to produce an image of your heart. The echocardiogram is simple, painless, obtained within a short period of time, and offers valuable information to your health care provider. The images from an echocardiogram can provide information such as:  Evidence of coronary artery disease (CAD).  Heart size.  Heart muscle function.  Heart valve function.  Aneurysm detection.  Evidence of a past heart attack.  Fluid buildup around the heart.  Heart muscle thickening.  Assess heart valve function. Tell a health care provider about:  Any allergies you have.  All medicines you are taking, including vitamins, herbs, eye drops, creams, and over-the-counter medicines.  Any problems you or family members have had with anesthetic medicines.  Any blood disorders you have.  Any surgeries you have had.  Any medical conditions you have.  Whether you are pregnant or may be pregnant. What happens before the procedure? No special preparation is needed. Eat and  drink normally. What happens during the procedure?  In order to produce an image of your heart, gel will be applied to your chest and a wand-like tool (transducer) will be moved over your chest. The gel will help transmit the sound waves from the transducer. The sound waves will harmlessly bounce off your heart to allow the heart images to be captured in real-time motion. These images will then be recorded.  You may need an IV to receive a medicine that improves the quality of the pictures. What happens after the procedure? You may return to your normal schedule including diet, activities, and medicines, unless your health care provider tells you otherwise. This information is not intended to replace advice given to you by your health care provider. Make sure you discuss any questions you have with your health care provider. Document Released: 07/12/2000 Document Revised: 03/02/2016 Document Reviewed: 03/22/2013 Elsevier Interactive Patient Education  2017 [employer].  Exercise Stress Echocardiogram An exercise stress echocardiogram is a test that checks how well your heart is working. For this test, you will walk on a treadmill to make your heart beat faster. This test uses sound waves (ultrasound) and a computer to make pictures (images) of your heart. These pictures will be taken before you exercise and after you exercise. What happens before the procedure?  Follow instructions from your doctor about what you cannot eat or drink before the test.  Do not drink or eat anything that has caffeine in it. Stop having caffeine for 24 hours before the test.  Ask your doctor about changing or stopping your normal medicines. This is important if you take diabetes medicines or blood thinners. Ask your doctor if you should take your medicines with water before  the test.  If you use an inhaler, bring it to the test.  Do not use any products that have nicotine or tobacco in them, such as cigarettes  and e-cigarettes. Stop using them for 4 hours before the test. If you need help quitting, ask your doctor.  Wear comfortable shoes and clothing. What happens during the procedure?  You will be hooked up to a TV screen. Your doctor will watch the screen to see how fast your heart beats during the test.  Before you exercise, a computer will make a picture of your heart. To do this:  A gel will be put on your chest.  A wand will be moved over the gel.  Sound waves from the wand will go to the computer to make the picture.  Your will start walking on a treadmill. The treadmill will start at a slow speed. It will get faster a little bit at a time. When you walk faster, your heart will beat faster.  The treadmill will be stopped when your heart is working hard.  You will lie down right away so another picture of your heart can be taken.  The test will take 30-60 minutes. What happens after the procedure?  Your heart rate and blood pressure will be watched after the test.  If your doctor says that you can, you may:  Eat what you usually eat.  Do your normal activities.  Take medicines like normal. Summary  An exercise stress echocardiogram is a test that checks how well your heart is working.  Follow instructions about what you cannot eat or drink before the test. Ask your doctor if you should take your normal medicines before the test.  Stop having caffeine for 24 hours before the test. Do not use anything with nicotine or tobacco in it for 4 hours before the test.  A computer will take a picture of your heart before you walk on a treadmill. It will take another picture when you are done walking.  Your heart rate and blood pressure will be watched after the test. This information is not intended to replace advice given to you by your health care provider. Make sure you discuss any questions you have with your health care provider. Document Released: 05/12/2009 Document Revised:  04/07/2016 Document Reviewed: 04/07/2016 Elsevier Interactive Patient Education  2017 [employer].

## 2016-08-13 NOTE — Progress Notes (Signed)
Cardiology Office Note   Date:  08/13/2016   ID:  Michele Long, DOB Jun 08, 1951, MRN [id number]  Referring Doctor:  Tommi Rumps, MD   Cardiologist:   Wende Bushy, MD   Reason for consultation:  Chief Complaint  Patient presents with  . OTHER    Chest pain. Meds reviewed verbally with pt.      History of Present Illness: Michele Long is a 66 y.o. female who presents for Evaluation of chest pain.  One month ago, she reports chest pressure radiating to the back, severe in intensity, lasting more than 1-1/2 hours, not relieved by Alka-Seltzer or her usual reflux medications. She describes the quality of the pain as similar to her reflux but it was just not responding to her usual medicine and also was lasting longer than usual. They called 911 and EMS recommended that she go to the ER. At that time she started hyperventilating and had a panic attack. She eventually calmed down.  Since that time, some changes to her medications for reflux were made including switching the PPI to proton neck. So far, she has had no recurrence of this pain.  Patient denies shortness of breath with her usual activities. No recurrence of chest pain. No PND, orthopnea. She usually gets and swelling in her legs for which she takes Lasix. No loss of consciousness. No fever, cough, colds, abdominal pain.   ROS:  Please see the history of present illness. Aside from mentioned under HPI, all other systems are reviewed and negative.     Past Medical History:  Diagnosis Date  . Allergy   . Depression   . DVT (deep venous thrombosis) ([place])   . Edema   . Frequent headaches   . GERD (gastroesophageal reflux disease)   . HTN (hypertension)   . Microalbuminuria   . Migraine   . Shingles   . Symptomatic menopausal or female climacteric states   . UTI (lower urinary tract infection)     Past Surgical History:  Procedure Laterality Date  . BREAST ENHANCEMENT SURGERY  1983  . NASAL SEPTUM SURGERY   2000     reports that she has never smoked. She has never used smokeless tobacco. She reports that she does not drink alcohol or use drugs.   family history includes Arthritis in her mother and paternal grandmother; Cancer in her maternal grandmother and paternal grandfather; Diabetes in her brother and maternal grandfather; Heart disease in her maternal grandfather; Hypertension in her father and mother; Migraines in her father; Osteoarthritis in her father; Pancreatic cancer in her paternal grandfather.   Outpatient Medications Prior to Visit  Medication Sig Dispense Refill  . ALPRAZolam (XANAX) 0.5 MG tablet Take 0.5 mg by mouth at bedtime as needed for anxiety.    . citalopram (CELEXA) 40 MG tablet TAKE 1 TABLET BY MOUTH EVERY DAY 30 tablet 3  . furosemide (LASIX) 20 MG tablet Take 20 mg by mouth daily.    . Iron-Vitamins (GERITOL PO) Take 1 tablet by mouth daily.    . Multiple Vitamin (MULTIVITAMIN) capsule Take 1 capsule by mouth daily.    . pantoprazole (PROTONIX) 40 MG tablet Take 1 tablet (40 mg total) by mouth daily. 30 tablet 3  . tiZANidine (ZANAFLEX) 4 MG capsule Take 4 mg by mouth 3 (three) times daily as needed for muscle spasms. Reported on 12/27/2015    . Vitamin D, Ergocalciferol, (DRISDOL) 50000 units CAPS capsule      No facility-administered medications prior to visit.  Allergies: Erythromycin    PHYSICAL EXAM: VS:  BP (!) 144/74 (BP Location: Right Arm, Patient Position: Sitting, Cuff Size: Normal)   Pulse 72   Ht 5\' 5"  (1.651 m)   Wt 220 lb 12 oz (100.1 kg)   BMI 36.73 kg/m  , Body mass index is 36.73 kg/m. Wt Readings from Last 3 Encounters:  08/13/16 220 lb 12 oz (100.1 kg)  07/09/16 220 lb 3.2 oz (99.9 kg)  01/29/16 225 lb 6.4 oz (102.2 kg)    GENERAL:  well developed, well nourished, not in acute distress HEENT: normocephalic, pink conjunctivae, anicteric sclerae, no xanthelasma, normal dentition, oropharynx clear NECK:  no neck vein engorgement,  JVP normal, no hepatojugular reflux, carotid upstroke brisk and symmetric, no bruit, no thyromegaly, no lymphadenopathy LUNGS:  good respiratory effort, clear to auscultation bilaterally CV:  PMI not displaced, no thrills, no lifts, S1 and S2 within normal limits, no palpable S3 or S4, no murmurs, no rubs, no gallops ABD:  Soft, nontender, nondistended, normoactive bowel sounds, no abdominal aortic bruit, no hepatomegaly, no splenomegaly MS: nontender back, no kyphosis, no scoliosis, no joint deformities EXT:  2+ DP/PT pulses, trace edema, no varicosities, no cyanosis, no clubbing SKIN: warm, nondiaphoretic, normal turgor, no ulcers NEUROPSYCH: alert, oriented to person, place, and time, sensory/motor grossly intact, normal mood, appropriate affect  Recent Labs: 04/04/2016: ALT 14; BUN 15; Hemoglobin 12.6; Platelets 240.0; Potassium 4.2; Sodium 139; TSH 2.67 07/09/2016: Creatinine, Ser 1.00   Lipid Panel    Component Value Date/Time   CHOL 174 04/04/2016 1038   TRIG 133.0 04/04/2016 1038   HDL 50.40 04/04/2016 1038   CHOLHDL 3 04/04/2016 1038   VLDL 26.6 04/04/2016 1038   LDLCALC 97 04/04/2016 1038     Other studies Reviewed:  EKG:  The ekg from 08/13/2016 was personally reviewed by me and it revealed sinus rhythm, 72 BPM  Additional studies/ records that were reviewed personally reviewed by me today include: None available   ASSESSMENT AND PLAN:  Chest pain or chest pressure Patient reports that this has improved since reflux measures were changed. This visit was made to make sure that there is no underlying coronary artery disease. Recommend stress echocardiogram and echocardiogram.   Elevated blood pressure Patient reports that she used to be on an antihypertensive but she has not taken this for probably 2 years now. Not in medical list what medication was previously on. Recommend BP log and follow up with PCP.  Obesity Body mass index is 36.73 kg/m.Marland Kitchen Recommend  aggressive weight loss through diet and increased physical activity.    Current medicines are reviewed at length with the patient today.  The patient does not have concerns regarding medicines.  Labs/ tests ordered today include:  Orders Placed This Encounter  Procedures  . EKG 12-Lead  . ECHOCARDIOGRAM STRESS TEST  . ECHOCARDIOGRAM COMPLETE    I had a lengthy and detailed discussion with the patient regarding diagnoses, prognosis, diagnostic options.  I counseled the patient on importance of lifestyle modification including heart healthy diet, regular physical activity .   Disposition:   FU with undersigned after tests prn  Thank you for this consultation. We will forwarding this consultation to referring physician.   Signed, Wende Bushy, MD  08/13/2016 2:57 PM    [place]  This note was generated in part with voice recognition software and I apologize for any typographical errors that were not detected and corrected.

## 2016-09-11 ENCOUNTER — Ambulatory Visit (INDEPENDENT_AMBULATORY_CARE_PROVIDER_SITE_OTHER): Payer: 59 | Admitting: Licensed Clinical Social Worker

## 2016-09-11 DIAGNOSIS — F331 Major depressive disorder, recurrent, moderate: Secondary | ICD-10-CM | POA: Diagnosis not present

## 2016-09-19 ENCOUNTER — Ambulatory Visit (INDEPENDENT_AMBULATORY_CARE_PROVIDER_SITE_OTHER): Payer: 59

## 2016-09-19 ENCOUNTER — Other Ambulatory Visit: Payer: Self-pay

## 2016-09-19 DIAGNOSIS — R079 Chest pain, unspecified: Secondary | ICD-10-CM

## 2016-09-19 LAB — ECHOCARDIOGRAM STRESS TEST
CHL CUP MPHR: 156 {beats}/min
CSEPEDS: 54 s
CSEPPHR: 155 {beats}/min
Estimated workload: 8.3 METS
Exercise duration (min): 6 min
Percent HR: 99 %
Rest HR: 71 {beats}/min

## 2016-09-30 ENCOUNTER — Ambulatory Visit: Payer: 59 | Admitting: Licensed Clinical Social Worker

## 2016-10-02 ENCOUNTER — Ambulatory Visit (INDEPENDENT_AMBULATORY_CARE_PROVIDER_SITE_OTHER): Payer: 59 | Admitting: Licensed Clinical Social Worker

## 2016-10-02 DIAGNOSIS — F3341 Major depressive disorder, recurrent, in partial remission: Secondary | ICD-10-CM | POA: Diagnosis not present

## 2016-10-18 ENCOUNTER — Ambulatory Visit: Payer: Self-pay | Admitting: Licensed Clinical Social Worker

## 2016-10-21 ENCOUNTER — Encounter: Payer: Self-pay | Admitting: Family Medicine

## 2016-10-21 ENCOUNTER — Ambulatory Visit (INDEPENDENT_AMBULATORY_CARE_PROVIDER_SITE_OTHER): Payer: 59 | Admitting: Family Medicine

## 2016-10-21 VITALS — BP 140/80 | HR 76 | Temp 98.5°F | Wt 221.2 lb

## 2016-10-21 DIAGNOSIS — H532 Diplopia: Secondary | ICD-10-CM

## 2016-10-21 DIAGNOSIS — R51 Headache: Secondary | ICD-10-CM

## 2016-10-21 DIAGNOSIS — Z9109 Other allergy status, other than to drugs and biological substances: Secondary | ICD-10-CM

## 2016-10-21 DIAGNOSIS — F418 Other specified anxiety disorders: Secondary | ICD-10-CM

## 2016-10-21 DIAGNOSIS — R519 Headache, unspecified: Secondary | ICD-10-CM

## 2016-10-21 MED ORDER — FLUTICASONE PROPIONATE 50 MCG/ACT NA SUSP: 2.0000 | Freq: Every day | NASAL | 6 refills | Status: DC

## 2016-10-21 NOTE — Patient Instructions (Signed)
Nice to see you. Please continue with the Celexa and seeing the psychologist. We will refer you to ophthalmology for evaluation of your double vision.  We will start on Flonase for your nasal irritation and postnasal drip. If you develop worsening vision issues please let us know immediately.

## 2016-10-21 NOTE — Progress Notes (Signed)
Pre visit review using our clinic review tool, if applicable. No additional management support is needed unless otherwise documented below in the visit note. 

## 2016-10-21 NOTE — Progress Notes (Signed)
  Tommi Rumps, MD Phone: [phone]  Michele Long is a 66 y.o. female who presents today for follow-up.  Depression/anxiety: Notes it is better. Mostly anxiety currently. She's seeing a psychologist. Has been beneficial. Celexa is beneficial. Was on Wellbutrin in the past though she does not know why she came off of this.  History of headaches: Rarely gets a tension headache. Has a history of migraines in the past though none recently. No numbness or weakness. She does report some vision changes over the last several months with sensation of double vision in her right eye when she is lying on her side. This has been going on intermittently for a couple of months. Hasn't worsened. Notes it is side-by-side double vision.  Patient notes some nasal irritation and postnasal drip at times. She's been using an antibiotic.  PMH: nonsmoker.   ROS see history of present illness  Objective  Physical Exam Vitals:   10/21/16 1511  BP: 140/80  Pulse: 76  Temp: 98.5 F (36.9 C)    BP Readings from Last 3 Encounters:  10/21/16 140/80  08/13/16 (!) 144/74  07/09/16 (!) 152/84   Wt Readings from Last 3 Encounters:  10/21/16 221 lb 3.2 oz (100.3 kg)  08/13/16 220 lb 12 oz (100.1 kg)  07/09/16 220 lb 3.2 oz (99.9 kg)    Physical Exam  Constitutional: No distress.  HENT:  Head: Normocephalic and atraumatic.  Mouth/Throat: Oropharynx is clear and moist. No oropharyngeal exudate.  Mild nasal septum irritation bilaterally with no bleeding  Eyes: Conjunctivae are normal. Pupils are equal, round, and reactive to light.  Cardiovascular: Normal rate, regular rhythm and normal heart sounds.   Pulmonary/Chest: Effort normal and breath sounds normal.  Neurological: She is alert. Gait normal.  CN 2-12 intact, 5/5 strength in bilateral biceps, triceps, grip, quads, hamstrings, plantar and dorsiflexion, sensation to light touch intact in bilateral UE and LE  Skin: Skin is warm and dry. She is not  diaphoretic.     Assessment/Plan: Please see individual problem list.  Depression with anxiety Improved. Continue current medications. Continue to see a psychologist. Consider Wellbutrin in the future.  Frequent headaches No recent headaches. She'll monitor for recurrence.  Environmental allergies Suspect nasal irritation is related to allergic rhinitis. Encouraged nasal saline rinse or gel. Could add Flonase as well.  Monocular diplopia of right eye Patient with double vision intermittently in her right eye for several months. No other associated symptoms. Benign neurological exam. We will refer her to the ophthalmologist first and then reevaluate. She is given return precautions.   Orders Placed This Encounter  Procedures  . Ambulatory referral to Ophthalmology    Referral Priority:   Routine    Referral Type:   Consultation    Referral Reason:   Specialty Services Required    Requested Specialty:   Ophthalmology    Number of Visits Requested:   1    Meds ordered this encounter  Medications  . fluticasone (FLONASE) 50 MCG/ACT nasal spray    Sig: Place 2 sprays into both nostrils daily.    Dispense:  16 g    Refill:  [place], MD [place]

## 2016-10-22 DIAGNOSIS — H532 Diplopia: Secondary | ICD-10-CM

## 2016-10-22 HISTORY — DX: Diplopia: H53.2

## 2016-10-22 NOTE — Assessment & Plan Note (Signed)
Improved. Continue current medications. Continue to see a psychologist. Consider Wellbutrin in the future.

## 2016-10-22 NOTE — Assessment & Plan Note (Signed)
No recent headaches. She'll monitor for recurrence.

## 2016-10-22 NOTE — Assessment & Plan Note (Addendum)
Patient with double vision intermittently in her right eye for several months. No other associated symptoms. Benign neurological exam. We will refer her to the ophthalmologist first and then reevaluate. She is given return precautions.

## 2016-10-22 NOTE — Assessment & Plan Note (Signed)
Suspect nasal irritation is related to allergic rhinitis. Encouraged nasal saline rinse or gel. Could add Flonase as well.

## 2016-10-24 ENCOUNTER — Encounter: Payer: Self-pay | Admitting: Family Medicine

## 2016-11-11 ENCOUNTER — Ambulatory Visit (INDEPENDENT_AMBULATORY_CARE_PROVIDER_SITE_OTHER): Payer: 59 | Admitting: Licensed Clinical Social Worker

## 2016-11-11 DIAGNOSIS — F3341 Major depressive disorder, recurrent, in partial remission: Secondary | ICD-10-CM

## 2016-11-14 ENCOUNTER — Other Ambulatory Visit: Payer: Self-pay | Admitting: Family Medicine

## 2016-12-02 ENCOUNTER — Ambulatory Visit: Payer: Self-pay | Admitting: Licensed Clinical Social Worker

## 2016-12-23 ENCOUNTER — Other Ambulatory Visit: Payer: Self-pay | Admitting: Family Medicine

## 2017-01-23 ENCOUNTER — Ambulatory Visit (INDEPENDENT_AMBULATORY_CARE_PROVIDER_SITE_OTHER): Payer: 59 | Admitting: Family Medicine

## 2017-01-23 ENCOUNTER — Encounter: Payer: Self-pay | Admitting: Family Medicine

## 2017-01-23 DIAGNOSIS — F418 Other specified anxiety disorders: Secondary | ICD-10-CM

## 2017-01-23 DIAGNOSIS — H532 Diplopia: Secondary | ICD-10-CM | POA: Diagnosis not present

## 2017-01-23 DIAGNOSIS — K219 Gastro-esophageal reflux disease without esophagitis: Secondary | ICD-10-CM

## 2017-01-23 MED ORDER — BUPROPION HCL ER (XL) 150 MG PO TB24: 150.0000 mg | ORAL_TABLET | Freq: Every day | ORAL | 2 refills | Status: DC

## 2017-01-23 NOTE — Assessment & Plan Note (Signed)
Continues to have issues with this. Discussed adding Wellbutrin. She'll check on the price and let us know if it is too expensive. She'll continue Celexa. Continue as needed Xanax.

## 2017-01-23 NOTE — Progress Notes (Signed)
  Tommi Rumps, MD Phone: [phone]  Michele Long is a 66 y.o. female who presents today for follow-up.  Depression/anxiety: Patient notes depression and anxiety currently. Notes her daughter makes her most anxious. Takes Xanax only if she gets upset. Is taking Celexa. Feels like she needs something additional to help with her symptoms. She did stop seeing the therapist due to cost concerns. As previously been on Wellbutrin though she did not longer percent sure why she came off of it. No SI.  She saw ophthalmology for her double vision. She states they felt as though it was related to blurry vision. She's not had any recurrent issues.  GERD: She notes no reflux symptoms. No blood in her stool. She is taking Protonix.  PMH: nonsmoker.   ROS see history of present illness  Objective  Physical Exam Vitals:   01/23/17 1038  BP: 138/84  Pulse: 71  Temp: 98.6 F (37 C)    BP Readings from Last 3 Encounters:  01/23/17 138/84  10/21/16 140/80  08/13/16 (!) 144/74   Wt Readings from Last 3 Encounters:  01/23/17 221 lb 9.6 oz (100.5 kg)  10/21/16 221 lb 3.2 oz (100.3 kg)  08/13/16 220 lb 12 oz (100.1 kg)    Physical Exam  Constitutional: No distress.  Cardiovascular: Normal rate, regular rhythm and normal heart sounds.   Pulmonary/Chest: Effort normal and breath sounds normal.  Abdominal: Soft. Bowel sounds are normal. She exhibits no distension. There is no tenderness. There is no rebound and no guarding.  Neurological: She is alert. Gait normal.  Skin: Skin is warm and dry. She is not diaphoretic.     Assessment/Plan: Please see individual problem list.  Monocular diplopia of right eye Resolved. Has seen ophthalmology. She follows up with them in about a month.  Depression with anxiety Continues to have issues with this. Discussed adding Wellbutrin. She'll check on the price and let us know if it is too expensive. She'll continue Celexa. Continue as needed  Xanax.  GERD (gastroesophageal reflux disease) Asymptomatic. Doing well on Protonix. She'll continue this.   No orders of the defined types were placed in this encounter.   Meds ordered this encounter  Medications  . buPROPion (WELLBUTRIN XL) 150 MG 24 hr tablet    Sig: Take 1 tablet (150 mg total) by mouth daily.    Dispense:  30 tablet    Refill:  2   Tommi Rumps, MD [place]

## 2017-01-23 NOTE — Assessment & Plan Note (Signed)
Resolved. Has seen ophthalmology. She follows up with them in about a month.

## 2017-01-23 NOTE — Patient Instructions (Signed)
Nice to see you. We'll start you on Wellbutrin. You should continue Celexa. You can continue Xanax as needed. Please monitor your reflux and if you develop symptoms let us know.

## 2017-01-23 NOTE — Assessment & Plan Note (Signed)
Asymptomatic. Doing well on Protonix. She'll continue this.

## 2017-02-20 ENCOUNTER — Other Ambulatory Visit: Payer: Self-pay | Admitting: Family Medicine

## 2017-03-20 ENCOUNTER — Ambulatory Visit (INDEPENDENT_AMBULATORY_CARE_PROVIDER_SITE_OTHER): Payer: 59 | Admitting: Family Medicine

## 2017-03-20 ENCOUNTER — Encounter: Payer: Self-pay | Admitting: Family Medicine

## 2017-03-20 DIAGNOSIS — F418 Other specified anxiety disorders: Secondary | ICD-10-CM | POA: Diagnosis not present

## 2017-03-20 DIAGNOSIS — T148XXA Other injury of unspecified body region, initial encounter: Secondary | ICD-10-CM | POA: Diagnosis not present

## 2017-03-20 DIAGNOSIS — IMO0001 Reserved for inherently not codable concepts without codable children: Secondary | ICD-10-CM | POA: Insufficient documentation

## 2017-03-20 DIAGNOSIS — R21 Rash and other nonspecific skin eruption: Secondary | ICD-10-CM | POA: Insufficient documentation

## 2017-03-20 MED ORDER — PREDNISONE 10 MG PO TABS: ORAL_TABLET | ORAL | 0 refills | Status: DC

## 2017-03-20 NOTE — Assessment & Plan Note (Signed)
Fairly nonspecific rash. Given extent of the rash we'll treat with oral steroids. If not improving with this she'll let us know and we'll refer to dermatology.

## 2017-03-20 NOTE — Progress Notes (Signed)
Tommi Rumps, MD Phone: [phone]  Michele Long is a 66 y.o. female who presents today for follow-up.  Anxiety/depression: Patient started on Wellbutrin. Also on Celexa. Rarely takes Xanax. She notes it's been a little difficult for her recently. It's been difficult caring for her father. He is elderly and has dementia and he is spending a lot more time in bed and not getting out of bed on his own too much. He does get up in the middle night and makes it difficult for her to sleep. She notes her dogs got into a fight and she had to take one of them to the vet for a tooth extraction. She notes some thoughts of wondering why she is still here and why did her husband die though she notes no intent or plan to harm herself. She notes no HI.  Rash: Notes rash on bilateral arms on the volar aspect. Notes it itches. Started after she mowed the yard. Has been spreading up her arms. Also between her breasts. Notes it's getting worse. She's tried Zyrtec with little benefit.  She has a small cut on her right thumb that occurred 2 days ago. It bled a little bit to start with though hasn't bled since then. She was slicing vegetables on a mandolin. She wondered if we can take the small piece of skin off. She's been using soap and water.  PMH: nonsmoker.   ROS see history of present illness  Objective  Physical Exam Vitals:   03/20/17 1049  BP: 130/84  Pulse: 77  Temp: 98.2 F (36.8 C)  SpO2: 96%    BP Readings from Last 3 Encounters:  03/20/17 130/84  01/23/17 138/84  10/21/16 140/80   Wt Readings from Last 3 Encounters:  03/20/17 219 lb 9.6 oz (99.6 kg)  01/23/17 221 lb 9.6 oz (100.5 kg)  10/21/16 221 lb 3.2 oz (100.3 kg)    Physical Exam  Constitutional: No distress.  Cardiovascular: Normal rate, regular rhythm and normal heart sounds.   Pulmonary/Chest: Effort normal and breath sounds normal.  Musculoskeletal: She exhibits no edema.  Neurological: She is alert. Gait normal.    Skin: Skin is warm and dry. She is not diaphoretic.  Scattered erythematous papular rash over the volar aspect of her forearms spreading up to her upper arms, similar rash between her breasts, nontender  Psychiatric:  Mood depressed, affect flat and tearful   on the right tip of her thumb there is a 3-4 mm small cut noted with a piece of dry skin attached, no bleeding, no signs of infection   Assessment/Plan: Please see individual problem list.  Depression with anxiety Continues to have issues with this. The Wellbutrin has been beneficial. I discussed increasing this though she though this. She would like to continue with her current regimen. I offered referral to psychiatry and a therapist that she declined that as well. I discussed that if she ever developed a plan or intent to harm herself or anyone else she would need to seek evaluation in the emergency room. She voiced understanding. We'll see her back in 2 months.  Her father is also my patient. I did discuss whether or not she has ever thought of a memory care unit for him and she noted they could not afford that. I also discussed neurology evaluation though she declined that as well.  Rash Fairly nonspecific rash. Given extent of the rash we'll treat with oral steroids. If not improving with this she'll let us know and we'll  refer to dermatology.  Cut Very small cut on the tip of her right thumb. No signs of infection. I discussed that I was hesitant to clip the skin off given that it could place her risk for infection in that area. She is provided a bandage to cover this up with. She'll keep an eye on it. Given return precautions.   No orders of the defined types were placed in this encounter.   Meds ordered this encounter  Medications  . predniSONE (DELTASONE) 10 MG tablet    Sig: Take 50 mg (5 tablets) by mouth today, then decrease by 1 tablet daily    Dispense:  15 tablet    Refill:  0   Tommi Rumps, MD [place]

## 2017-03-20 NOTE — Patient Instructions (Signed)
Nice to see you. We will treat your rash with prednisone. Please keep the area on your thumb covered up. The skin should come off on its own. Please continue your medications for anxiety and depression. If you develop thoughts of harming yourself or any intent or plan to harm yourself or others you need to be evaluated in the emergency room.

## 2017-03-20 NOTE — Assessment & Plan Note (Signed)
Continues to have issues with this. The Wellbutrin has been beneficial. I discussed increasing this though she though this. She would like to continue with her current regimen. I offered referral to psychiatry and a therapist that she declined that as well. I discussed that if she ever developed a plan or intent to harm herself or anyone else she would need to seek evaluation in the emergency room. She voiced understanding. We'll see her back in 2 months.  Her father is also my patient. I did discuss whether or not she has ever thought of a memory care unit for him and she noted they could not afford that. I also discussed neurology evaluation though she declined that as well.

## 2017-03-20 NOTE — Assessment & Plan Note (Signed)
Very small cut on the tip of her right thumb. No signs of infection. I discussed that I was hesitant to clip the skin off given that it could place her risk for infection in that area. She is provided a bandage to cover this up with. She'll keep an eye on it. Given return precautions.

## 2017-03-24 ENCOUNTER — Telehealth: Payer: Self-pay | Admitting: *Deleted

## 2017-03-24 MED ORDER — HYDROXYZINE HCL 25 MG PO TABS: 25.0000 mg | ORAL_TABLET | Freq: Three times a day (TID) | ORAL | 0 refills | Status: DC | PRN

## 2017-03-24 NOTE — Telephone Encounter (Signed)
Pt was seen last week for a rash, and was given prednisone. The medication did not work. Pt requested another medication for the itching  Pt contact [facility] in [place]

## 2017-03-24 NOTE — Telephone Encounter (Signed)
Please advise 

## 2017-03-24 NOTE — Telephone Encounter (Signed)
We can try hydroxyzine. This may make her drowsy. If her rash is not improving we should refer to dermatology.

## 2017-03-24 NOTE — Telephone Encounter (Signed)
Patient notified

## 2017-03-25 ENCOUNTER — Other Ambulatory Visit: Payer: Self-pay | Admitting: Family Medicine

## 2017-03-25 NOTE — Telephone Encounter (Signed)
Last OV 03/20/17 last filled 12/24/16 30 2rf

## 2017-03-26 ENCOUNTER — Telehealth: Payer: Self-pay | Admitting: Family Medicine

## 2017-03-26 DIAGNOSIS — R21 Rash and other nonspecific skin eruption: Secondary | ICD-10-CM

## 2017-03-26 NOTE — Telephone Encounter (Signed)
Pt called about now wanting to get a referral to see the dermatologist. Pt states it is spreading and itching really bad. Dermatologist in [place]. Please advise?  Call pt @ [phone]. Thank you!

## 2017-03-26 NOTE — Telephone Encounter (Signed)
Referral placed. We could see her back in the office to reevaluate if she would like.

## 2017-03-26 NOTE — Telephone Encounter (Signed)
Please advise 

## 2017-03-27 NOTE — Telephone Encounter (Signed)
Left message to notify

## 2017-03-28 ENCOUNTER — Ambulatory Visit (INDEPENDENT_AMBULATORY_CARE_PROVIDER_SITE_OTHER): Payer: 59 | Admitting: Family Medicine

## 2017-03-28 ENCOUNTER — Encounter: Payer: Self-pay | Admitting: Family Medicine

## 2017-03-28 DIAGNOSIS — R21 Rash and other nonspecific skin eruption: Secondary | ICD-10-CM

## 2017-03-28 DIAGNOSIS — F418 Other specified anxiety disorders: Secondary | ICD-10-CM

## 2017-03-28 MED ORDER — TRIAMCINOLONE ACETONIDE 0.5 % EX OINT: 1.0000 "application " | TOPICAL_OINTMENT | Freq: Two times a day (BID) | CUTANEOUS | 0 refills | Status: DC

## 2017-03-28 NOTE — Patient Instructions (Signed)
Nice to see you. I suspect your rash is an allergic rash possibly to the new detergent. You should discontinue use of that detergent. We will try triamcinolone topically for you.  We will have you see dermatology. If you develop fever or you or to feel poorly please be evaluated

## 2017-03-28 NOTE — Assessment & Plan Note (Signed)
Continued issues with rash. Suspect allergic dermatitis possibly related to the new detergent that she is using. Advised to stop using that detergent. We'll trial triamcinolone topically and we will check on her dermatology referral.

## 2017-03-28 NOTE — Progress Notes (Signed)
  Tommi Rumps, MD Phone: [phone]  Michele Long is a 66 y.o. female who presents today for f/u.  Rash: Patient notes continued issues with rash on her arms and near her neck. She has a history of contact dermatitis and this feels similar patch does have a history of scabies in the past that this is not similar to that. She did start a new detergent for her father that she just thought of. The prednisone was not helpful. Nothing has helped with the itching with rash. She's had no fevers. She feels at her baseline.  Depression: Patient notes this is stable. She is hanging in there. No SI. Currently taking Wellbutrin and Celexa.  ROS see history of present illness  Objective  Physical Exam Vitals:   03/28/17 1509  BP: 128/66  Pulse: 81  Resp: 16  Temp: 98.6 F (37 C)  SpO2: 98%    BP Readings from Last 3 Encounters:  03/28/17 128/66  03/20/17 130/84  01/23/17 138/84   Wt Readings from Last 3 Encounters:  03/28/17 219 lb 8 oz (99.6 kg)  03/20/17 219 lb 9.6 oz (99.6 kg)  01/23/17 221 lb 9.6 oz (100.5 kg)    Physical Exam  Constitutional: No distress.  Cardiovascular: Normal rate, regular rhythm and normal heart sounds.   Pulmonary/Chest: Effort normal and breath sounds normal.  Musculoskeletal: She exhibits no edema.  Neurological: She is alert. Gait normal.  Skin: She is not diaphoretic.         Assessment/Plan: Please see individual problem list.  Rash Continued issues with rash. Suspect allergic dermatitis possibly related to the new detergent that she is using. Advised to stop using that detergent. We'll trial triamcinolone topically and we will check on her dermatology referral.  Depression with anxiety Stable. No SI. She'll continue her current medication.   No orders of the defined types were placed in this encounter.   Meds ordered this encounter  Medications  . triamcinolone ointment (KENALOG) 0.5 %    Sig: Apply 1 application topically 2  (two) times daily. For up to one week    Dispense:  30 g    Refill:  0   Tommi Rumps, MD [place]

## 2017-03-28 NOTE — Assessment & Plan Note (Signed)
Stable. No SI. She'll continue her current medication.

## 2017-04-08 ENCOUNTER — Other Ambulatory Visit: Payer: Self-pay | Admitting: Family Medicine

## 2017-04-28 ENCOUNTER — Other Ambulatory Visit: Payer: Self-pay | Admitting: Family Medicine

## 2017-05-18 ENCOUNTER — Encounter: Payer: Self-pay | Admitting: Family Medicine

## 2017-05-18 DIAGNOSIS — Z5181 Encounter for therapeutic drug level monitoring: Secondary | ICD-10-CM

## 2017-05-19 NOTE — Telephone Encounter (Signed)
Please advise as this is a historical medication on her list.  Last OV was 03/28/17.

## 2017-05-20 NOTE — Telephone Encounter (Signed)
Please check with patient to see how often she is taking this medication. Please see if it helps with her swelling. Thanks.

## 2017-05-21 MED ORDER — FUROSEMIDE 20 MG PO TABS: 20.0000 mg | ORAL_TABLET | Freq: Every day | ORAL | 0 refills | Status: DC

## 2017-05-21 NOTE — Telephone Encounter (Signed)
Spoke with the patient, she takes it 3 times a week, and yes it helps with the swelling. thanks

## 2017-05-21 NOTE — Telephone Encounter (Signed)
Refill sent to pharmacy. Patient needs a BMP at her convenience in the next several weeks to check renal function given that she's been taking this medication. Order has been placed. Thanks.

## 2017-06-04 ENCOUNTER — Other Ambulatory Visit: Payer: Self-pay | Admitting: Family Medicine

## 2017-06-06 ENCOUNTER — Ambulatory Visit: Payer: PPO | Admitting: Family Medicine

## 2017-06-06 ENCOUNTER — Encounter: Payer: Self-pay | Admitting: Family Medicine

## 2017-06-06 VITALS — BP 140/90 | HR 66 | Temp 97.7°F | Wt 222.4 lb

## 2017-06-06 DIAGNOSIS — Z23 Encounter for immunization: Secondary | ICD-10-CM | POA: Diagnosis not present

## 2017-06-06 DIAGNOSIS — R519 Headache, unspecified: Secondary | ICD-10-CM

## 2017-06-06 DIAGNOSIS — K219 Gastro-esophageal reflux disease without esophagitis: Secondary | ICD-10-CM

## 2017-06-06 DIAGNOSIS — F418 Other specified anxiety disorders: Secondary | ICD-10-CM

## 2017-06-06 DIAGNOSIS — R030 Elevated blood-pressure reading, without diagnosis of hypertension: Secondary | ICD-10-CM | POA: Diagnosis not present

## 2017-06-06 DIAGNOSIS — R51 Headache: Secondary | ICD-10-CM | POA: Diagnosis not present

## 2017-06-06 NOTE — Progress Notes (Signed)
Tommi Rumps, MD Phone: [phone]  Michele Long is a 66 y.o. female who presents today for follow-up.  Anxiety/depression: Taking Celexa and Wellbutrin.  Most of the time she is okay though things build up and she explodes at times.  No SI or HI.  She does feel sad and feels like she is in a rut.  Her father still lives with her and is the source of most of the stress.  If she were able to get more help with him she thinks she would be to better.  Xanax does not make her drowsy.  She reports intermittent tension headaches.  Do not occur that often.  Stress builds up in her shoulders and then the headache is in the back of her head.  Most recently occurred last week.  She will take a muscle relaxer and this will improve.  No numbness or weakness or vision changes.  GERD: Reports she has been burping more recently.  Does not happen every day.  No blood in her stool.  No dysphagia.  She is taking Protonix.  Does not always take it 30 minutes prior to breakfast.  BP slightly elevated today.  No history of hypertension.  Social History   Tobacco Use  Smoking Status Never Smoker  Smokeless Tobacco Never Used     ROS see history of present illness  Objective  Physical Exam Vitals:   06/06/17 1034  BP: 140/90  Pulse: 66  Temp: 97.7 F (36.5 C)  SpO2: 98%    BP Readings from Last 3 Encounters:  06/06/17 140/90  03/28/17 128/66  03/20/17 130/84   Wt Readings from Last 3 Encounters:  06/06/17 222 lb 6.4 oz (100.9 kg)  03/28/17 219 lb 8 oz (99.6 kg)  03/20/17 219 lb 9.6 oz (99.6 kg)    Physical Exam  Constitutional: No distress.  HENT:  Head: Normocephalic and atraumatic.  Cardiovascular: Normal rate, regular rhythm and normal heart sounds.  Pulmonary/Chest: Effort normal and breath sounds normal.  Abdominal: Soft. Bowel sounds are normal. She exhibits no distension. There is no tenderness. There is no rebound and no guarding.  Musculoskeletal: She exhibits no edema.    Neurological: She is alert.  CN 2-12 intact, 5/5 strength in bilateral biceps, triceps, grip, quads, hamstrings, plantar and dorsiflexion, sensation to light touch intact in bilateral UE and LE, normal gait  Skin: Skin is warm and dry. She is not diaphoretic.     Assessment/Plan: Please see individual problem list.  GERD (gastroesophageal reflux disease) Slight increase in symptoms.  Discussed appropriately taking Protonix 30 minutes prior to breakfast.  If that is not beneficial she will add Zantac 30 minutes prior to dinner.  If she continues to have issues she will let us know and we can refer to GI.  Depression with anxiety Stable.  No SI or HI.  She does not want to make any changes.  We will try to get her assistance with her father.  Home health referral placed for her dad.  Frequent headaches Single recent headache.  Tension in nature.  Resolved with muscle relaxer use.  Neurologically intact.  She will monitor for increasing frequency.  Elevated BP without diagnosis of hypertension No prior history.  Patient hesitant to return for recheck.  She will check it at home and if not consistently less than 130/80 she will will let us know.   Michele Long was seen today for follow-up.  Diagnoses and all orders for this visit:  Gastroesophageal reflux disease, esophagitis presence  not specified  Encounter for immunization -     Flu vaccine HIGH DOSE PF  Depression with anxiety  Frequent headaches  Elevated BP without diagnosis of hypertension    Orders Placed This Encounter  Procedures  . Flu vaccine HIGH DOSE PF    No orders of the defined types were placed in this encounter.    Tommi Rumps, MD [place]

## 2017-06-06 NOTE — Assessment & Plan Note (Signed)
Stable.  No SI or HI.  She does not want to make any changes.  We will try to get her assistance with her father.  Home health referral placed for her dad.

## 2017-06-06 NOTE — Assessment & Plan Note (Signed)
Slight increase in symptoms.  Discussed appropriately taking Protonix 30 minutes prior to breakfast.  If that is not beneficial she will add Zantac 30 minutes prior to dinner.  If she continues to have issues she will let us know and we can refer to GI.

## 2017-06-06 NOTE — Assessment & Plan Note (Signed)
No prior history.  Patient hesitant to return for recheck.  She will check it at home and if not consistently less than 130/80 she will will let us know.

## 2017-06-06 NOTE — Patient Instructions (Signed)
Nice to see you. Please continue your current medications for anxiety and depression. If you develop thoughts of harming your self or others please seek medical attention. We will work on getting your dad set up with home health nursing and a Education officer, museum. Please try to take your Protonix 30 minutes prior to breakfast.  If that does not make a difference you can add Zantac 30 minutes prior to dinner.  If that does not make a difference or you persistently need the Zantac please let us know so we can have you see GI.

## 2017-06-06 NOTE — Assessment & Plan Note (Signed)
Single recent headache.  Tension in nature.  Resolved with muscle relaxer use.  Neurologically intact.  She will monitor for increasing frequency.

## 2017-07-01 ENCOUNTER — Other Ambulatory Visit: Payer: Self-pay | Admitting: Family Medicine

## 2017-08-14 ENCOUNTER — Other Ambulatory Visit: Payer: Self-pay | Admitting: Family Medicine

## 2017-09-09 ENCOUNTER — Ambulatory Visit (INDEPENDENT_AMBULATORY_CARE_PROVIDER_SITE_OTHER): Payer: PPO | Admitting: Family Medicine

## 2017-09-09 ENCOUNTER — Other Ambulatory Visit: Payer: Self-pay

## 2017-09-09 ENCOUNTER — Encounter: Payer: Self-pay | Admitting: Family Medicine

## 2017-09-09 VITALS — BP 140/80 | HR 75 | Temp 98.2°F | Wt 221.4 lb

## 2017-09-09 DIAGNOSIS — F418 Other specified anxiety disorders: Secondary | ICD-10-CM

## 2017-09-09 DIAGNOSIS — N882 Stricture and stenosis of cervix uteri: Secondary | ICD-10-CM | POA: Diagnosis not present

## 2017-09-09 DIAGNOSIS — E669 Obesity, unspecified: Secondary | ICD-10-CM

## 2017-09-09 DIAGNOSIS — K219 Gastro-esophageal reflux disease without esophagitis: Secondary | ICD-10-CM | POA: Diagnosis not present

## 2017-09-09 DIAGNOSIS — Z1231 Encounter for screening mammogram for malignant neoplasm of breast: Secondary | ICD-10-CM

## 2017-09-09 DIAGNOSIS — Z780 Asymptomatic menopausal state: Secondary | ICD-10-CM | POA: Diagnosis not present

## 2017-09-09 DIAGNOSIS — Z1239 Encounter for other screening for malignant neoplasm of breast: Secondary | ICD-10-CM

## 2017-09-09 DIAGNOSIS — Z13220 Encounter for screening for lipoid disorders: Secondary | ICD-10-CM | POA: Diagnosis not present

## 2017-09-09 DIAGNOSIS — R600 Localized edema: Secondary | ICD-10-CM

## 2017-09-09 NOTE — Progress Notes (Signed)
Tommi Rumps, MD Phone: [phone]  Michele Long is a 67 y.o. female who presents today for follow-up.  Anxiety/depression: Notes this is better than it was previously.  She still has some days that are worse than others.  Taking Celexa and Wellbutrin.  Rarely takes Xanax.  Notes she does not sleep a whole lot given that she has to take care of her father has dementia.  Notes the days that she does not sleep she just does not have a lot of energy.  She feels better after sleeping.  She has hired a new caregiver which has been helpful.  No SI.  GERD: She is taking her Protonix appropriately now and has no symptoms of reflux.  No blood in her stool.  She does report a history of chronic intermittent lower extremity edema that has been stable.  She does take Lasix for this.  Patient reports it has been sometime since she had a mammogram and a Pap smear.  Also has not had a DEXA scan recently.  Does not appear to have had lab work recently either.  Social History   Tobacco Use  Smoking Status Never Smoker  Smokeless Tobacco Never Used     ROS see history of present illness  Objective  Physical Exam Vitals:   09/09/17 1031  BP: 140/80  Pulse: 75  Temp: 98.2 F (36.8 C)  SpO2: 95%    BP Readings from Last 3 Encounters:  09/09/17 140/80  06/06/17 140/90  03/28/17 128/66   Wt Readings from Last 3 Encounters:  09/09/17 221 lb 6.4 oz (100.4 kg)  06/06/17 222 lb 6.4 oz (100.9 kg)  03/28/17 219 lb 8 oz (99.6 kg)    Physical Exam  Constitutional: No distress.  Cardiovascular: Normal rate, regular rhythm and normal heart sounds.  Pulmonary/Chest: Effort normal and breath sounds normal.  Genitourinary:  Genitourinary Comments: Normal labia, normal vaginal mucosa, cervical loss appears to be stenosed, unable to complete Pap smear due to this  Musculoskeletal: She exhibits no edema.  Neurological: She is alert. Gait normal.  Skin: Skin is warm and dry. She is not  diaphoretic.     Assessment/Plan: Please see individual problem list.  GERD (gastroesophageal reflux disease) Improved control.  Continue Protonix.  Depression with anxiety Improved.  Continue current regimen.  Bilateral lower extremity edema Stable on Lasix.  She needs lab work.  Cervical stenosis (uterine cervix) Noted on exam.  Will refer to gynecology for evaluation and Pap smear. Mammogram and bone density test ordered.  Orders Placed This Encounter  Procedures  . DG Bone Density    Standing Status:   Future    Standing Expiration Date:   11/08/2018    Order Specific Question:   Reason for Exam (SYMPTOM  OR DIAGNOSIS REQUIRED)    Answer:   postmenopasual estrogen deficiency, osteoporosis screening    Order Specific Question:   Preferred imaging location?    Answer:   [facility]  . MM SCREENING BREAST TOMO BILATERAL    Standing Status:   Future    Standing Expiration Date:   11/08/2018    Order Specific Question:   Reason for Exam (SYMPTOM  OR DIAGNOSIS REQUIRED)    Answer:   breast cancer screening    Order Specific Question:   Preferred imaging location?    Answer:   [facility]  . Lipid panel    Standing Status:   Future    Standing Expiration Date:   09/09/2018  . HgB A1c  Standing Status:   Future    Standing Expiration Date:   09/09/2018  . Comp Met (CMET)    Standing Status:   Future    Standing Expiration Date:   09/09/2018  . Ambulatory referral to Gynecology    Referral Priority:   Routine    Referral Type:   Consultation    Referral Reason:   Specialty Services Required    Requested Specialty:   Gynecology    Number of Visits Requested:   1    No orders of the defined types were placed in this encounter.    Tommi Rumps, MD [place]

## 2017-09-09 NOTE — Assessment & Plan Note (Signed)
Stable on Lasix.  She needs lab work.

## 2017-09-09 NOTE — Assessment & Plan Note (Signed)
Noted on exam.  Will refer to gynecology for evaluation and Pap smear.

## 2017-09-09 NOTE — Assessment & Plan Note (Signed)
Improved.  Continue current regimen

## 2017-09-09 NOTE — Assessment & Plan Note (Signed)
Improved control.  Continue Protonix.

## 2017-09-09 NOTE — Patient Instructions (Signed)
Nice to see you. Please monitor your anxiety please try to get more sleep. We will get you set up for mammogram. We will refer you to gynecology your cervix.

## 2017-09-10 ENCOUNTER — Telehealth: Payer: Self-pay | Admitting: Obstetrics & Gynecology

## 2017-09-10 NOTE — Telephone Encounter (Signed)
[facility] referring for Cervical stenosis (uterine cervix).  Called and spoke with patient to schedule referral. Patient was unable to schedule at the time of phone call. Patient with call back to be schedule

## 2017-09-12 NOTE — Telephone Encounter (Signed)
Called and lvm for patient to call back to be schedule

## 2017-09-14 ENCOUNTER — Other Ambulatory Visit: Payer: Self-pay | Admitting: Family Medicine

## 2017-09-16 ENCOUNTER — Encounter: Payer: Self-pay | Admitting: Obstetrics and Gynecology

## 2017-09-16 ENCOUNTER — Ambulatory Visit (INDEPENDENT_AMBULATORY_CARE_PROVIDER_SITE_OTHER): Payer: PPO | Admitting: Obstetrics and Gynecology

## 2017-09-16 ENCOUNTER — Other Ambulatory Visit: Payer: Self-pay

## 2017-09-16 VITALS — BP 140/78 | HR 87 | Ht 66.0 in | Wt 226.0 lb

## 2017-09-16 DIAGNOSIS — Z01419 Encounter for gynecological examination (general) (routine) without abnormal findings: Secondary | ICD-10-CM | POA: Diagnosis not present

## 2017-09-16 DIAGNOSIS — N882 Stricture and stenosis of cervix uteri: Secondary | ICD-10-CM

## 2017-09-16 DIAGNOSIS — Z124 Encounter for screening for malignant neoplasm of cervix: Secondary | ICD-10-CM | POA: Diagnosis not present

## 2017-09-16 NOTE — Progress Notes (Signed)
Obstetrics & Gynecology Office Visit   Chief Complaint  Patient presents with  . Referral    [facility] Cervical Stenosis  Referral from Dr. Tommi Rumps, MD, from [facility] for cervical stenosis and pap smear  History of Present Illness: 67 y.o.  menopausal female who presents to have a pelvic exam and pap smear from Dr. Caryl Bis, as noted, above. A pap smear was attempted in clinic. However, the cervical os appeared stenotic and he was unable to perform the pap smear. She is not sexually active and has not been since the passing of her husband four years ago. She denies a history of abnormal pap smears, STD. Her last pap smear was greater than four years ago, according to her. She denies vaginal symptoms, including menopausal bleeding.   Past Medical History:  Diagnosis Date  . Allergy   . Depression   . DVT (deep venous thrombosis) ([place])   . Edema   . Frequent headaches   . Gastric ulcer 10/27/2015  . GERD (gastroesophageal reflux disease)   . HTN (hypertension)   . Microalbuminuria   . Migraine   . Shingles   . Symptomatic menopausal or female climacteric states   . UTI (lower urinary tract infection)     Past Surgical History:  Procedure Laterality Date  . BREAST ENHANCEMENT SURGERY  1983  . NASAL SEPTUM SURGERY  2000   Gynecologic History: No LMP recorded. Patient is postmenopausal.  Family History  Problem Relation Age of Onset  . Arthritis Mother   . Hypertension Mother   . Hypertension Father   . Osteoarthritis Father   . Migraines Father   . Cancer Maternal Grandmother   . Diabetes Maternal Grandfather   . Heart disease Maternal Grandfather   . Arthritis Paternal Grandmother   . Cancer Paternal Grandfather        Prostate  . Pancreatic cancer Paternal Grandfather   . Diabetes Brother     Social History   Socioeconomic History  . Marital status: Widowed    Spouse name: Not on file  . Number of children: Not on file  . Years of education:  Not on file  . Highest education level: Not on file  Social Needs  . Financial resource strain: Not on file  . Food insecurity - worry: Not on file  . Food insecurity - inability: Not on file  . Transportation needs - medical: Not on file  . Transportation needs - non-medical: Not on file  Occupational History  . Not on file  Tobacco Use  . Smoking status: Never Smoker  . Smokeless tobacco: Never Used  Substance and Sexual Activity  . Alcohol use: No    Alcohol/week: 0.0 oz  . Drug use: No  . Sexual activity: No  Other Topics Concern  . 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 Erythromycin Nausea And Vomiting  . Sulfa Antibiotics Nausea And Vomiting and Nausea Only    Prior to Admission medications   Medication Sig Start Date End Date Taking? Authorizing Provider  ALPRAZolam Duanne Moron) 0.5 MG tablet Take 0.5 mg by mouth at bedtime as needed for anxiety.   Yes [provider]  buPROPion (WELLBUTRIN XL) 150 MG 24 hr tablet  TAKE 1 TABLET BY MOUTH EVERY DAY 08/14/17  Yes Leone Haven, MD  citalopram (CELEXA) 40 MG tablet TAKE 1 TABLET BY MOUTH ONCE DAILY 03/25/17  Yes Leone Haven, MD  furosemide (LASIX) 20 MG tablet TAKE 1 TABLET BY MOUTH ONCE DAILY 07/03/17  Yes Leone Haven, MD  pantoprazole (PROTONIX) 40 MG tablet TAKE 1 TABLET BY MOUTH ONCE DAILY 06/05/17  Yes Leone Haven, MD    Review of Systems  Constitutional: Negative.   HENT: Negative.   Eyes: Negative.   Respiratory: Negative.   Cardiovascular: Negative.   Gastrointestinal: Negative.   Genitourinary: Negative.   Musculoskeletal: Negative.   Skin: Negative.   Neurological: Negative.   Psychiatric/Behavioral: Negative.      Physical Exam BP 140/78 (BP Location: Left Arm, Patient Position: Sitting, Cuff  Size: Normal)   Pulse 87   Ht 5\' 6"  (1.676 m)   Wt 226 lb (102.5 kg)   BMI 36.48 kg/m  No LMP recorded. Patient is postmenopausal. Physical Exam  Constitutional: She is oriented to person, place, and time. She appears well-developed and well-nourished. No distress.  Genitourinary: Vagina normal and uterus normal. Pelvic exam was performed with patient supine. There is no rash, tenderness or lesion on the right labia. There is no rash, tenderness or lesion on the left labia. Vagina exhibits no lesion. No erythema or bleeding in the vagina. No foreign body in the vagina. No signs of injury around the vagina. Right adnexum does not display mass, does not display tenderness and does not display fullness. Left adnexum does not display mass, does not display tenderness and does not display fullness. Cervix does not exhibit motion tenderness, lesion or polyp.   Uterus is mobile. Uterus is not enlarged, tender, exhibiting a mass or irregular (is regular).  Genitourinary Comments: Cervix stenotic without lesions.  Cells collected using pap smear spatula and flat brush.   HENT:  Head: Normocephalic and atraumatic.  Eyes: Conjunctivae are normal. No scleral icterus.  Neck: Normal range of motion. Neck supple. No thyromegaly present.  Cardiovascular: Normal rate and regular rhythm.  Pulmonary/Chest: Effort normal and breath sounds normal. No respiratory distress.  Abdominal: Soft. Bowel sounds are normal. She exhibits no distension and no mass. There is no tenderness. There is no rebound and no guarding.  Musculoskeletal: Normal range of motion. She exhibits no edema or tenderness.  Lymphadenopathy:    She has no cervical adenopathy.  Neurological: She is alert and oriented to person, place, and time. No cranial nerve deficit.  Skin: Skin is warm and dry.  Psychiatric: She has a normal mood and affect. Her behavior is normal. Judgment normal.  Breast exam deferred.   Female chaperone present for  pelvic and breast  portions of the physical exam  Assessment: 67 y.o. menopausal female here for  1. Women's annual routine gynecological examination   2. Pap smear for cervical cancer screening   3. Cervical stenosis (uterine cervix)    Plan: Problem List Items Addressed This Visit      Genitourinary   Cervical stenosis (uterine cervix)    Other Visit Diagnoses    Women's annual routine gynecological examination    -  Primary   Relevant Orders   Pap IG (Image Guided) (Completed)   Pap smear for cervical cancer screening       Relevant Orders   Pap IG (Image Guided) (Completed)     Pap smear collected.  Cervix not dilated. No symptoms. No indication for intervention at  this time.  If she develops bleeding or cramping or other concerning symptoms, will consider dilation.  If pap smear abnormal, will consider dilation for improved evaluation of cervix.  All other preventive care per her PCP.  Given age, if normal pap smear, per ASCCP guidelines, patient does not need another pap smear unless clinically indicated.    Prentice Docker, MD 09/20/2017 11:15 AM     ADDENDUM:  Pap smear normal (endocervical/glandular cells noted).  HPV not performed given age.   No need for further cytologic cervical evaluation, unless clinically indicated (per ASCCP).  Pelvic exams still recommended every 1-2 years or as clinically indicated.   CC: Leone Haven, MD [place], [place] [address]

## 2017-09-17 LAB — PAP IG (IMAGE GUIDED): PAP Smear Comment: 0

## 2017-09-20 ENCOUNTER — Encounter: Payer: Self-pay | Admitting: Obstetrics and Gynecology

## 2017-09-24 ENCOUNTER — Other Ambulatory Visit: Payer: Self-pay | Admitting: Family Medicine

## 2017-09-24 MED ORDER — FUROSEMIDE 20 MG PO TABS: 20.0000 mg | ORAL_TABLET | Freq: Every day | ORAL | 2 refills | Status: DC

## 2017-09-25 ENCOUNTER — Other Ambulatory Visit (INDEPENDENT_AMBULATORY_CARE_PROVIDER_SITE_OTHER): Payer: PPO

## 2017-09-25 DIAGNOSIS — Z13220 Encounter for screening for lipoid disorders: Secondary | ICD-10-CM | POA: Diagnosis not present

## 2017-09-25 DIAGNOSIS — E669 Obesity, unspecified: Secondary | ICD-10-CM

## 2017-09-25 LAB — COMPREHENSIVE METABOLIC PANEL
ALBUMIN: 3.9 g/dL (ref 3.5–5.2)
ALK PHOS: 49 U/L (ref 39–117)
ALT: 12 U/L (ref 0–35)
AST: 14 U/L (ref 0–37)
BUN: 14 mg/dL (ref 6–23)
CALCIUM: 9.9 mg/dL (ref 8.4–10.5)
CHLORIDE: 106 meq/L (ref 96–112)
CO2: 28 mEq/L (ref 19–32)
Creatinine, Ser: 1.04 mg/dL (ref 0.40–1.20)
GFR: 56.46 mL/min — ABNORMAL LOW (ref >60.00)
Glucose, Bld: 91 mg/dL (ref 70–99)
POTASSIUM: 4.3 meq/L (ref 3.5–5.1)
Sodium: 142 mEq/L (ref 135–145)
Total Bilirubin: 1.3 mg/dL — ABNORMAL HIGH (ref 0.2–1.2)
Total Protein: 7.1 g/dL (ref 6.0–8.3)

## 2017-09-25 LAB — LIPID PANEL
CHOLESTEROL: 179 mg/dL (ref 0–200)
HDL: 55.5 mg/dL (ref >39.00)
LDL Cholesterol: 98 mg/dL (ref 0–99)
NonHDL: 123.05
Total CHOL/HDL Ratio: 3
Triglycerides: 127 mg/dL (ref 0.0–149.0)
VLDL: 25.4 mg/dL (ref 0.0–40.0)

## 2017-09-25 LAB — HEMOGLOBIN A1C: HEMOGLOBIN A1C: 5.8 % (ref 4.6–6.5)

## 2017-10-14 ENCOUNTER — Ambulatory Visit
Admission: RE | Admit: 2017-10-14 | Discharge: 2017-10-14 | Disposition: A | Discharge status: Home / Self Care | Payer: PPO | Source: Ambulatory Visit | Attending: Family Medicine | Admitting: Family Medicine

## 2017-10-14 ENCOUNTER — Other Ambulatory Visit: Payer: Self-pay | Admitting: Family Medicine

## 2017-10-14 DIAGNOSIS — Z1239 Encounter for other screening for malignant neoplasm of breast: Secondary | ICD-10-CM

## 2017-10-14 DIAGNOSIS — Z1231 Encounter for screening mammogram for malignant neoplasm of breast: Secondary | ICD-10-CM | POA: Diagnosis not present

## 2017-10-21 ENCOUNTER — Other Ambulatory Visit: Payer: Self-pay | Admitting: *Deleted

## 2017-10-21 ENCOUNTER — Inpatient Hospital Stay
Admission: RE | Admit: 2017-10-21 | Discharge: 2017-10-21 | Disposition: A | Discharge status: Home / Self Care | Payer: Self-pay | Source: Ambulatory Visit | Attending: *Deleted | Admitting: *Deleted

## 2017-10-21 DIAGNOSIS — Z9289 Personal history of other medical treatment: Secondary | ICD-10-CM

## 2017-10-26 ENCOUNTER — Other Ambulatory Visit: Payer: Self-pay | Admitting: Family Medicine

## 2017-10-30 ENCOUNTER — Other Ambulatory Visit: Payer: Self-pay | Admitting: Family Medicine

## 2017-11-26 ENCOUNTER — Other Ambulatory Visit: Payer: Self-pay | Admitting: Family Medicine

## 2017-12-23 ENCOUNTER — Other Ambulatory Visit: Payer: Self-pay | Admitting: Family Medicine

## 2018-01-05 ENCOUNTER — Encounter: Payer: Self-pay | Admitting: Family Medicine

## 2018-01-06 ENCOUNTER — Ambulatory Visit (INDEPENDENT_AMBULATORY_CARE_PROVIDER_SITE_OTHER): Payer: PPO | Admitting: Family Medicine

## 2018-01-06 ENCOUNTER — Encounter: Payer: Self-pay | Admitting: Family Medicine

## 2018-01-06 VITALS — BP 130/60 | HR 83 | Temp 98.5°F | Wt 220.0 lb

## 2018-01-06 DIAGNOSIS — M25511 Pain in right shoulder: Secondary | ICD-10-CM

## 2018-01-06 DIAGNOSIS — F418 Other specified anxiety disorders: Secondary | ICD-10-CM

## 2018-01-06 MED ORDER — IBUPROFEN 600 MG PO TABS: 600.0000 mg | ORAL_TABLET | Freq: Three times a day (TID) | ORAL | 0 refills | Status: DC | PRN

## 2018-01-06 MED ORDER — ALPRAZOLAM 0.5 MG PO TABS: 0.5000 mg | ORAL_TABLET | Freq: Every evening | ORAL | 0 refills | Status: DC | PRN

## 2018-01-06 NOTE — Assessment & Plan Note (Signed)
I suspect a rotator cuff issue given her particular symptoms.  Potentially could be arthritis or other muscular issue or an issue with her labrum.  Discussed options of physical therapy with anti-inflammatory use, injection, or referral to sports medicine.  She opted for physical therapy and anti-inflammatory.  She will use the ibuprofen on a schedule for the next 4 to 5 days.  She will take this with food.  If she has any stomach upset or increased reflux she we will stop the medicine and let us know.  If not improving consider injection or sports medicine referral.

## 2018-01-06 NOTE — Assessment & Plan Note (Signed)
No depression.  Does have sadness related to her father's passing away.  Likely grief.  Has anxiety as well that has worsened.  We will have her increase to 0.5 mg of Xanax nightly anxiety.  I did discuss hospice counseling though she has done that when her husband passed away previously.  She will monitor her symptoms and follow-up in 3 months.  If worsening she will let us know.  Drug database reviewed.

## 2018-01-06 NOTE — Patient Instructions (Signed)
Nice to see you. We will have you see physical therapy for your right shoulder.  You can take the ibuprofen 600 mg by mouth every 8 hours on a scheduled for the next 4 to 5 days and then as needed.  Please take this with food.  If it irritates your stomach or worsens your reflux please discontinue the medication let us know. You may also take a whole tablet of your Xanax nightly to help with anxiety.  This may make you drowsy so be careful.  If it does make you drowsy please do not drive.

## 2018-01-06 NOTE — Progress Notes (Signed)
Michele Rumps, MD Phone: [phone]  Michele Long is a 67 y.o. female who presents today for f/u.  CC: right shoulder pain, anxiety  She notes right shoulder pain that has been going on for several months now.  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  Notes no specific injury.  Notes it hurts with abduction, internal and external rotation.  It does not hurt when it is at her side.  Notes is getting some worse.  It sharp when it does hurt.  She is taking Tylenol for.  Anxiety: Patient notes this has worsened since her father passed away.  She notes it is just taking some time to get used to.  The house is quite a bit more quiet.  She has been taking Xanax 0.25 mg very intermittently when she needs to calm down.  She notes a lot of her anxiety came out when dealing with her daughter yesterday.  She notes that Xanax helps her remain calm.  She does feel sad.  She notes no depression.  She notes no drowsiness with Xanax.  She does not drink alcohol.  Social History   Tobacco Use  Smoking Status Never Smoker  Smokeless Tobacco Never Used     ROS see history of present illness  Objective  Physical Exam Vitals:   01/06/18 0930  BP: 130/60  Pulse: 83  Temp: 98.5 F (36.9 C)  SpO2: 95%    BP Readings from Last 3 Encounters:  01/06/18 130/60  09/16/17 140/78  09/09/17 140/80   Wt Readings from Last 3 Encounters:  01/06/18 220 lb (99.8 kg)  09/16/17 226 lb (102.5 kg)  09/09/17 221 lb 6.4 oz (100.4 kg)    Physical Exam  Constitutional: No distress.  Cardiovascular: Normal rate, regular rhythm and normal heart sounds.  Pulmonary/Chest: Effort normal and breath sounds normal.  Musculoskeletal: She exhibits no edema.  Bilateral shoulders symmetric with right shoulder having tenderness in the middle of the right deltoid, no left shoulder tenderness, no other right shoulder tenderness, right shoulder with full passive  range of motion with discomfort throughout all ranges of motion actively and passively, negative empty can bilaterally, negative speeds bilaterally, bilateral hands are warm and well-perfused  Neurological: She is alert.  Skin: Skin is warm and dry. She is not diaphoretic.     Assessment/Plan: Please see individual problem list.  Depression with anxiety No depression.  Does have sadness related to her father's passing away.  Likely grief.  Has anxiety as well that has worsened.  We will have her increase to 0.5 mg of Xanax nightly anxiety.  I did discuss hospice counseling though she has done that when her husband passed away previously.  She will monitor her symptoms and follow-up in 3 months.  If worsening she will let us know.  Drug database reviewed.  Acute pain of right shoulder I suspect a rotator cuff issue given her particular symptoms.  Potentially could be arthritis or other muscular issue or an issue with her labrum.  Discussed options of physical therapy with anti-inflammatory use, injection, or referral to sports medicine.  She opted for physical therapy and anti-inflammatory.  She will use the ibuprofen on a schedule for the next 4 to 5 days.  She will take this with food.  If she has any stomach upset or increased reflux she we will stop the medicine and let us know.  If not improving consider injection or sports  medicine referral.    Orders Placed This Encounter  Procedures  . Ambulatory referral to Physical Therapy    Referral Priority:   Routine    Referral Type:   Physical Medicine    Referral Reason:   Specialty Services Required    Requested Specialty:   Physical Therapy    Number of Visits Requested:   1    Meds ordered this encounter  Medications  . ALPRAZolam (XANAX) 0.5 MG tablet    Sig: Take 1 tablet (0.5 mg total) by mouth at bedtime as needed for anxiety.    Dispense:  30 tablet    Refill:  0  . ibuprofen (ADVIL,MOTRIN) 600 MG tablet    Sig: Take 1 tablet  (600 mg total) by mouth every 8 (eight) hours as needed.    Dispense:  30 tablet    Refill:  0     Michele Rumps, MD [place]

## 2018-01-25 ENCOUNTER — Other Ambulatory Visit: Payer: Self-pay | Admitting: Family Medicine

## 2018-01-27 ENCOUNTER — Ambulatory Visit: Payer: PPO | Attending: Family Medicine

## 2018-01-27 DIAGNOSIS — M25611 Stiffness of right shoulder, not elsewhere classified: Secondary | ICD-10-CM | POA: Diagnosis not present

## 2018-01-27 DIAGNOSIS — M25511 Pain in right shoulder: Secondary | ICD-10-CM | POA: Insufficient documentation

## 2018-01-27 DIAGNOSIS — G8929 Other chronic pain: Secondary | ICD-10-CM | POA: Insufficient documentation

## 2018-01-27 NOTE — Therapy (Signed)
[facility] [address], [place], [address] Phone: [phone]   Fax:  [phone]  Physical Therapy Evaluation  Patient Details  Name: Michele Long MRN: [id number] Date of Birth: Sep 13, 1950 No data recorded  Encounter Date: 01/27/2018  PT End of Session - 01/27/18 1750    Visit Number  1    Number of Visits  13    Date for PT Re-Evaluation  03/10/18    PT Start Time  1430    PT Stop Time  1530    PT Time Calculation (min)  60 min    Activity Tolerance  Patient tolerated treatment well    Behavior During Therapy  [facility] for tasks assessed/performed       Past Medical History:  Diagnosis Date  . Allergy   . Depression   . DVT (deep venous thrombosis) ([place])   . Edema   . Frequent headaches   . Gastric ulcer 10/27/2015  . GERD (gastroesophageal reflux disease)   . HTN (hypertension)   . Microalbuminuria   . Migraine   . Shingles   . Symptomatic menopausal or female climacteric states   . UTI (lower urinary tract infection)     Past Surgical History:  Procedure Laterality Date  . AUGMENTATION MAMMAPLASTY Bilateral 1983  . BREAST ENHANCEMENT SURGERY  1983  . NASAL SEPTUM SURGERY  2000    There were no vitals filed for this visit.   Subjective Assessment - 01/27/18 1806    Subjective  Pt reports that her R shoulder gradually began hurting approximately 4 months ago that she believes is because of increased use of R arm while helping her elderly father transfer and walk. Pt also reports that the pain has gotten significantly worse in the past month and that it is significantly impacting her daily activities (unable to wash/brush hair with R arm). Pt went to MD and pt reports that MD did not believe anything was torn and that he recommended PT and anti-inflammatories. R shoulder pain used to be intermittent but now pt reports that pain is now constant during any activities. Pt stated that when she goes to  reach with R arm there is a sharp pain. No N/T reported. Pts shoulder pain will occasionally wake her at night but pt reports that she has no problem falling back to sleep. No recent unexplained weight gain/loss or bowl and/or bladder issues. Pt would like to get back to yardwork, gardening, reaching up into cabinets pain free and "everyday tasks".      Limitations  House hold activities;Lifting    Patient Stated Goals  get back to everyday tasks, be able to do yardwork and reach up in cabinents    Currently in Pain?  Yes    Pain Score  2     Pain Location  Shoulder    Pain Orientation  Right    Pain Descriptors / Indicators  Aching    Pain Type  Chronic pain    Pain Onset  More than a month ago    Pain Frequency  Constant    Aggravating Factors   flexion and abduction, sleeping on R side, washing hair/blowdrying hair    Pain Relieving Factors  ibuprofen    Effect of Pain on Daily Activities  significantly impacting pts ability to perform household chores         [facility] PT Assessment - 01/27/18 0001      Assessment   Medical Diagnosis  R shoulder pain    Onset Date/Surgical Date  09/27/17    Hand Dominance  Right    Prior Therapy  Yes      Balance Screen   Has the patient fallen in the past 6 months  No    Has the patient had a decrease in activity level because of a fear of falling?   Yes    Is the patient reluctant to leave their home because of a fear of falling?   No      Prior Function   Level of Independence  Independent    Leisure  yardwork, gardening      Cognition   Overall Cognitive Status  Within Functional Limits for tasks assessed      Observation/Other Assessments   Focus on Therapeutic Outcomes (FOTO)   49 goal: 62      Posture/Postural Control   Posture/Postural Control  Postural limitations    Postural Limitations  Rounded Shoulders;Forward head      ROM / Strength   AROM / PROM / Strength  AROM;PROM;Strength      AROM   Overall AROM   Deficits;Due to  pain    Overall AROM Comments  L WFL    AROM Assessment Site  Shoulder    Right/Left Shoulder  Right    Right Shoulder Flexion  100 Degrees    Right Shoulder ABduction  75 Degrees    Right Shoulder Internal Rotation  -- L shoulder able to reach T8 with behind back    Right Shoulder External Rotation  -- Right shoulder can reach posterior occiput, L shoulder T3      PROM   PROM Assessment Site  Shoulder    Right/Left Shoulder  Right    Right Shoulder Flexion  125 Degrees    Right Shoulder ABduction  75 Degrees    Right Shoulder Internal Rotation  15 Degrees    Right Shoulder External Rotation  25 Degrees      Strength   Overall Strength Comments  4/5 ER and IR (ER more painful than IR)      Palpation   Palpation comment  TTP middle delt, infraspinatus, triceps proximal insertion, subscapularis, serratus, and short head of biceps insertion        Objective measurements completed on examination: See above findings.    TREATMENT AAROM shoulder flexion prone with LUE assisting RUE through pain free ROM x10 Scapular retractions x20 (verbal and tactile cueing required) Pulleys (flexion and scaption) in pain free range x20   Patient tolerated treatment well with no increase in pain.   PT Education - 01/27/18 1540    Education Details  Patient educated on PT plan of care and HEP (shoulder retractions, pulleys, and self assisted AAROM shoulder flexion)    Person(s) Educated  Patient    Methods  Explanation;Demonstration    Comprehension  Verbalized understanding;Returned demonstration          PT Long Term Goals - 01/27/18 1823      PT LONG TERM GOAL #1   Title  Pt will be independent and compliant with HEP in order to maintain gains made in PT and return to prior level of funciton.    Baseline  Pt dependent on proper form and technique for HEP exercises    Time  4    Period  Weeks    Status  New    Target Date  02/24/18      PT LONG TERM GOAL #2  Title  Pt will  report worst pain in past week less than 2/10 in order to return to prior level of function    Baseline  4/10    Time  6    Period  Weeks    Status  New    Target Date  03/10/18      PT LONG TERM GOAL #3   Title  Pt will demonstrate full, pain free, shoulder ROM in order to reach into cabinets, perform houshold activities and return to prior level of function.    Baseline  flex 100 deg, ABD 75 deg, ER 25, IR 15    Time  6    Period  Weeks    Status  New    Target Date  03/10/18      PT LONG TERM GOAL #4   Title  Pt will improve FOTO score from 49 to 62 in order to demonstrate an improvement in function.    Baseline  current FOTO: 49    Time  6    Period  Weeks    Status  New    Target Date  03/10/18             Plan - 01/27/18 1809    Clinical Impression Statement  Patient is 68 y.o R hand dominant female presenting with increase R shoulder pain starting approximately 4 months ago after having to physically assist elderly father with mobility primarily using her R arm, per pt report. Pt presents with increased R shoulder dysfunction indicated by decreased AROM and PROM of R shoulder. Pt also demonstrates pain upon palpation to middle deltoid, infraspinatus tendon, proximal triceps insertion, subscapularis, serratus anterior, and biceps insertion. Pt will benefit from skilled PT in order to return to prior level of function.    Clinical Presentation  Stable    Clinical Decision Making  Long    Rehab Potential  Good    Clinical Impairments Affecting Rehab Potential  +motivated    PT Frequency  2x / week    PT Duration  6 weeks    PT Treatment/Interventions  ADLs/Self Care Home Management;Aquatic Therapy;Cryotherapy;Electrical Stimulation;Moist Heat;Ultrasound;Therapeutic activities;Therapeutic exercise;Patient/family education;Manual techniques;Passive range of motion;Dry needling    PT Next Visit Plan  pulleys, AAROM exercises, STM, isometrics, review HEP    PT Home Exercise  Plan  see education section    Consulted and Agree with Plan of Care  Patient       Patient will benefit from skilled therapeutic intervention in order to improve the following deficits and impairments:  Increased muscle spasms, Decreased range of motion, Impaired UE functional use, Pain, Decreased strength  Visit Diagnosis: Chronic right shoulder pain  Stiffness of right shoulder, not elsewhere classified     Problem List Patient Active Problem List   Diagnosis Date Noted  . Acute pain of right shoulder 01/06/2018  . Cervical stenosis (uterine cervix) 09/09/2017  . Elevated BP without diagnosis of hypertension 06/06/2017  . Monocular diplopia of right eye 10/22/2016  . Bilateral lower extremity edema 01/30/2016  . Obesity 12/27/2015  . Frequent headaches 10/27/2015  . Environmental allergies 10/27/2015  . DVT (deep venous thrombosis) ([place]) 10/27/2015  . Depression with anxiety 10/23/2015  . GERD (gastroesophageal reflux disease) 10/23/2015  . Vitamin D deficiency 10/23/2015    Georg Ruddle, SPT 01/27/2018, 6:30 PM  [place] [address] [place], [address] Phone: [phone]   Fax:  [phone]  Name: Michele Long  MRN: [id number] Date of Birth: 27-Nov-1950

## 2018-01-29 ENCOUNTER — Other Ambulatory Visit: Payer: Self-pay | Admitting: Family Medicine

## 2018-01-30 ENCOUNTER — Other Ambulatory Visit: Payer: Self-pay | Admitting: Family Medicine

## 2018-01-30 NOTE — Telephone Encounter (Signed)
Patient requesting refill for Ibuprofen please advise to refill shoulder stiffness.

## 2018-02-03 ENCOUNTER — Ambulatory Visit: Payer: PPO

## 2018-02-03 DIAGNOSIS — M25511 Pain in right shoulder: Secondary | ICD-10-CM | POA: Diagnosis not present

## 2018-02-03 DIAGNOSIS — G8929 Other chronic pain: Secondary | ICD-10-CM

## 2018-02-03 DIAGNOSIS — M25611 Stiffness of right shoulder, not elsewhere classified: Secondary | ICD-10-CM

## 2018-02-03 NOTE — Therapy (Signed)
[facility] [address] [place], [address] Phone: [phone]   Fax:  [phone]  Physical Therapy Treatment  Patient Details  Name: Michele Long MRN: [id number] Date of Birth: [birth year] No data recorded  Encounter Date: 02/03/2018  PT End of Session - 02/03/18 1553    Visit Number  2    Number of Visits  13    Date for PT Re-Evaluation  03/10/18    PT Start Time  1430    PT Stop Time  1515    PT Time Calculation (min)  45 min    Activity Tolerance  Patient tolerated treatment well    Behavior During Therapy  [facility] for tasks assessed/performed       Past Medical History:  Diagnosis Date  . Allergy   . Depression   . DVT (deep venous thrombosis) ([place])   . Edema   . Frequent headaches   . Gastric ulcer 10/27/2015  . GERD (gastroesophageal reflux disease)   . HTN (hypertension)   . Microalbuminuria   . Migraine   . Shingles   . Symptomatic menopausal or female climacteric states   . UTI (lower urinary tract infection)     Past Surgical History:  Procedure Laterality Date  . AUGMENTATION MAMMAPLASTY Bilateral 1983  . BREAST ENHANCEMENT SURGERY  1983  . NASAL SEPTUM SURGERY  2000    There were no vitals filed for this visit.  Subjective Assessment - 02/03/18 1540    Subjective  Pt reports that her shoulder has been feeling slightly better overall but that she may have overdone it this morning while spreading mulch. Additionally, pt stated that she has been doing her HEP 2x/day with no increase in pain.    Limitations  House hold activities;Lifting    Patient Stated Goals  get back to everyday tasks, be able to do yardwork and reach up in cabinents    Currently in Pain?  No/denies         TREATMENT Therapeutic Exercises AAROM shoulder flexion in sitting with LUE assisting RUE through pain free ROM x10 Scapular retractions x20 (decreased verbal and tactile cueing required) Pulleys  (flexion and scaption) in pain free range x20 Seated self inferior mobilization with slight lateral cervical flexion to L (verbal cueing required for proper technique, added to HEP) 30s x5  4 way isometrics against wall (flex, ext, IR, ER) with elbow at 90 deg, towel between elbow and trunk.  Manual Therapy STM to R shoulder musculature with pt in supine in order to decrease pain and muscle spasms x10 min    Pt fatigued at end of treatment session. Patient tolerated treatment well with no increase in pain.      PT Education - 02/03/18 1552    Education Details  Pt educated on proper form and technique for therapeutic exercises.  Pt also educated on additional exercise to add to HEP (seated self inferior mobilization).    Person(s) Educated  Patient    Methods  Explanation;Demonstration    Comprehension  Verbalized understanding;Returned demonstration          PT Long Term Goals - 01/27/18 1823      PT LONG TERM GOAL #1   Title  Pt will be independent and compliant with HEP in order to maintain gains made in PT and return to prior level of funciton.    Baseline  Pt dependent on proper form and technique for HEP exercises    Time  4    Period  Weeks    Status  New    Target Date  02/24/18      PT LONG TERM GOAL #2   Title  Pt will report worst pain in past week less than 2/10 in order to return to prior level of function    Baseline  4/10    Time  6    Period  Weeks    Status  New    Target Date  03/10/18      PT LONG TERM GOAL #3   Title  Pt will demonstrate full, pain free, shoulder ROM in order to reach into cabinets, perform houshold activities and return to prior level of function.    Baseline  flex 100 deg, ABD 75 deg, ER 25, IR 15    Time  6    Period  Weeks    Status  New    Target Date  03/10/18      PT LONG TERM GOAL #4   Title  Pt will improve FOTO score from 49 to 62 in order to demonstrate an improvement in function.    Baseline  current FOTO: 49     Time  6    Period  Weeks    Status  New    Target Date  03/10/18            Plan - 02/03/18 1553    Clinical Impression Statement  Patient presents with significant muscle spasms in R shoulder musculature (upper traps, deltoid, pec minor, supraspinatus). Pt reports decreased pain in R shoulder following STM. Pt also presents with increaesd tolerance to pulley exercise. Pt demonstrated R sholder fatigue at end of treatment session indicating significant weakness. Pt will benefit from continued skilled PT in order return to prior level of function.    Rehab Potential  Good    Clinical Impairments Affecting Rehab Potential  +motivated    PT Frequency  2x / week    PT Duration  6 weeks    PT Treatment/Interventions  ADLs/Self Care Home Management;Aquatic Therapy;Cryotherapy;Electrical Stimulation;Moist Heat;Ultrasound;Therapeutic activities;Therapeutic exercise;Patient/family education;Manual techniques;Passive range of motion;Dry needling    PT Next Visit Plan  isometrics, STM, review self inferior mob    PT Home Exercise Plan  see education section    Consulted and Agree with Plan of Care  Patient       Patient will benefit from skilled therapeutic intervention in order to improve the following deficits and impairments:  Increased muscle spasms, Decreased range of motion, Impaired UE functional use, Pain, Decreased strength  Visit Diagnosis: Chronic right shoulder pain  Stiffness of right shoulder, not elsewhere classified     Problem List Patient Active Problem List   Diagnosis Date Noted  . Acute pain of right shoulder 01/06/2018  . Cervical stenosis (uterine cervix) 09/09/2017  . Elevated BP without diagnosis of hypertension 06/06/2017  . Monocular diplopia of right eye 10/22/2016  . Bilateral lower extremity edema 01/30/2016  . Obesity 12/27/2015  . Frequent headaches 10/27/2015  . Environmental allergies 10/27/2015  . DVT (deep venous thrombosis) ([place]) 10/27/2015  .  Depression with anxiety 10/23/2015  . GERD (gastroesophageal reflux disease) 10/23/2015  . Vitamin D deficiency 10/23/2015    Georg Ruddle, SPT 02/03/2018, 3:58 PM  [facility] [address], [place], [address] Phone: [phone]   Fax:  [phone]  Name: Nysia Dell MRN: [id number] Date of Birth: 09/19/1950

## 2018-02-05 ENCOUNTER — Ambulatory Visit: Payer: PPO

## 2018-02-05 DIAGNOSIS — M25511 Pain in right shoulder: Secondary | ICD-10-CM | POA: Diagnosis not present

## 2018-02-05 DIAGNOSIS — M25611 Stiffness of right shoulder, not elsewhere classified: Secondary | ICD-10-CM

## 2018-02-05 DIAGNOSIS — G8929 Other chronic pain: Secondary | ICD-10-CM

## 2018-02-05 NOTE — Therapy (Signed)
[facility] [facility] [address], [place], [address] Phone: [phone]   Fax:  [phone]  Physical Therapy Treatment  Patient Details  Name: Michele Long MRN: [id number] Date of Birth: September 20, 1950 No data recorded  Encounter Date: 02/05/2018  PT End of Session - 02/05/18 1558    Visit Number  3    Number of Visits  13    Date for PT Re-Evaluation  03/10/18    PT Start Time  1430    PT Stop Time  1500    PT Time Calculation (min)  30 min    Activity Tolerance  Patient tolerated treatment well    Behavior During Therapy  [facility] for tasks assessed/performed       Past Medical History:  Diagnosis Date  . Allergy   . Depression   . DVT (deep venous thrombosis) ([place])   . Edema   . Frequent headaches   . Gastric ulcer 10/27/2015  . GERD (gastroesophageal reflux disease)   . HTN (hypertension)   . Microalbuminuria   . Migraine   . Shingles   . Symptomatic menopausal or female climacteric states   . UTI (lower urinary tract infection)     Past Surgical History:  Procedure Laterality Date  . AUGMENTATION MAMMAPLASTY Bilateral 1983  . BREAST ENHANCEMENT SURGERY  1983  . NASAL SEPTUM SURGERY  2000    There were no vitals filed for this visit.  Subjective Assessment - 02/05/18 1556    Subjective  Pt reports that her shoulder has been sore from the previous session but tht the soreness began easing yesterday slightly. Pt stated that she has completed her HEP every day.     Limitations  House hold activities;Lifting    Patient Stated Goals  get back to everyday tasks, be able to do yardwork and reach up in cabinents    Pain Score  3     Pain Location  Shoulder    Pain Orientation  Right    Pain Descriptors / Indicators  Aching    Pain Type  Chronic pain    Pain Onset  More than a month ago         TREATMENT Therapeutic Exercises Scapular retractions x20 (significant verbal and tactile cueing  required) Pulleys (flexion and scaption) in pain free range x20 4 way isometrics against wall (flex, ext, IR, ER) with elbow at 90 deg, towel between elbow and trunk. (increased pain with ER)   Manual Therapy STM to R shoulder musculature with pt in supine in order to decrease pain and muscle spasms x10 min    Pt fatigued at end of treatment session.          PT Education - 02/05/18 1557    Education Details  Pt educated on proper form and technique for therapeutic exercises. Pt also eduated on proper form for HEP exercises.     Person(s) Educated  Patient    Methods  Explanation;Demonstration    Comprehension  Verbalized understanding;Returned demonstration          PT Long Term Goals - 01/27/18 1823      PT LONG TERM GOAL #1   Title  Pt will be independent and compliant with HEP in order to maintain gains made in PT and return to prior level of funciton.    Baseline  Pt dependent on proper form and technique for HEP exercises    Time  4    Period  Weeks  Status  New    Target Date  02/24/18      PT LONG TERM GOAL #2   Title  Pt will report worst pain in past week less than 2/10 in order to return to prior level of function    Baseline  4/10    Time  6    Period  Weeks    Status  New    Target Date  03/10/18      PT LONG TERM GOAL #3   Title  Pt will demonstrate full, pain free, shoulder ROM in order to reach into cabinets, perform houshold activities and return to prior level of function.    Baseline  flex 100 deg, ABD 75 deg, ER 25, IR 15    Time  6    Period  Weeks    Status  New    Target Date  03/10/18      PT LONG TERM GOAL #4   Title  Pt will improve FOTO score from 49 to 62 in order to demonstrate an improvement in function.    Baseline  current FOTO: 49    Time  6    Period  Weeks    Status  New    Target Date  03/10/18            Plan - 02/05/18 1559    Clinical Impression Statement  Pt continues to present with singificant muscle  spasms in R shoulder musculature. Pt required significant verbal and tactile cueing for proper form for shoulder retraction exercise. Decreased cueing required for isometric wall push exercises. Pt will benefit from continued skilled PT in order to return to prior level of function.     PT Treatment/Interventions  ADLs/Self Care Home Management;Aquatic Therapy;Cryotherapy;Electrical Stimulation;Moist Heat;Ultrasound;Therapeutic activities;Therapeutic exercise;Patient/family education;Manual techniques;Passive range of motion;Dry needling    PT Next Visit Plan  isometrics, STM    PT Home Exercise Plan  see education section    Consulted and Agree with Plan of Care  Patient       Patient will benefit from skilled therapeutic intervention in order to improve the following deficits and impairments:     Visit Diagnosis: Chronic right shoulder pain  Stiffness of right shoulder, not elsewhere classified     Problem List Patient Active Problem List   Diagnosis Date Noted  . Acute pain of right shoulder 01/06/2018  . Cervical stenosis (uterine cervix) 09/09/2017  . Elevated BP without diagnosis of hypertension 06/06/2017  . Monocular diplopia of right eye 10/22/2016  . Bilateral lower extremity edema 01/30/2016  . Obesity 12/27/2015  . Frequent headaches 10/27/2015  . Environmental allergies 10/27/2015  . DVT (deep venous thrombosis) ([place]) 10/27/2015  . Depression with anxiety 10/23/2015  . GERD (gastroesophageal reflux disease) 10/23/2015  . Vitamin D deficiency 10/23/2015    Georg Ruddle, SPT 02/05/2018, 4:02 PM  [place] [address], [place], [address] Phone: [phone]   Fax:  [phone]  Name: Najiyah Paris MRN: [id number] Date of Birth: [birth year]

## 2018-02-10 ENCOUNTER — Ambulatory Visit: Payer: PPO

## 2018-02-10 DIAGNOSIS — G8929 Other chronic pain: Secondary | ICD-10-CM

## 2018-02-10 DIAGNOSIS — M25511 Pain in right shoulder: Principal | ICD-10-CM

## 2018-02-10 DIAGNOSIS — M25611 Stiffness of right shoulder, not elsewhere classified: Secondary | ICD-10-CM

## 2018-02-10 NOTE — Therapy (Signed)
[facility] [address], [place], [address] Phone: [phone]   Fax:  [phone]  Physical Therapy Treatment  Patient Details  Name: Michele Long MRN: [id number] Date of Birth: August 17, 1950 No data recorded  Encounter Date: 02/10/2018  PT End of Session - 02/10/18 1754    Visit Number  4    Number of Visits  13    Date for PT Re-Evaluation  03/10/18    PT Start Time  1430    PT Stop Time  1515    PT Time Calculation (min)  45 min    Activity Tolerance  Patient tolerated treatment well    Behavior During Therapy  [employer] for tasks assessed/performed       Past Medical History:  Diagnosis Date  . Allergy   . Depression   . DVT (deep venous thrombosis) ([place])   . Edema   . Frequent headaches   . Gastric ulcer 10/27/2015  . GERD (gastroesophageal reflux disease)   . HTN (hypertension)   . Microalbuminuria   . Migraine   . Shingles   . Symptomatic menopausal or female climacteric states   . UTI (lower urinary tract infection)     Past Surgical History:  Procedure Laterality Date  . AUGMENTATION MAMMAPLASTY Bilateral 1983  . BREAST ENHANCEMENT SURGERY  1983  . NASAL SEPTUM SURGERY  2000    There were no vitals filed for this visit.  Subjective Assessment - 02/10/18 1752    Subjective  Pt reports that shoulder was not as sore after previous session compared to session before last. Pt also stated that she feels her range of motion is improving and that her shoulder feels better. Pt completes HEP 2x./day.    Limitations  House hold activities;Lifting    Patient Stated Goals  get back to everyday tasks, be able to do yardwork and reach up in cabinents    Currently in Pain?  No/denies        TREATMENT Therapeutic Exercises Scapular retractions 10x3 (decreased verbal and tactile cueing required) Pulleys (flexion and scaption) in pain free range x30 4 way isometrics against wall (flex, ext, IR, ER)  with elbow at 90 deg, towel between elbow and trunk. Slow ER ROM stretch, pt standing facing wall with R elbow at 90 deg, slowly walking to L in circle (putting arm in ER) until stretch is felt 30s hold x5    Manual Therapy STM to R shoulder musculature with pt in supine in order to decrease pain and muscle spasms x10 min      Pt fatigued at end of treatment session. Patient tolerated treatment well with no increase in pain          PT Education - 02/10/18 1753    Education Details  Pt educated on proper form and tecnique for therapeutic exercises. Pt given print out for 4 way isometric wall push exercises.    Person(s) Educated  Patient    Methods  Explanation;Demonstration;Handout    Comprehension  Verbalized understanding;Returned demonstration          PT Long Term Goals - 01/27/18 1823      PT LONG TERM GOAL #1   Title  Pt will be independent and compliant with HEP in order to maintain gains made in PT and return to prior level of funciton.    Baseline  Pt dependent on proper form and technique for HEP exercises    Time  4  Period  Weeks    Status  New    Target Date  02/24/18      PT LONG TERM GOAL #2   Title  Pt will report worst pain in past week less than 2/10 in order to return to prior level of function    Baseline  4/10    Time  6    Period  Weeks    Status  New    Target Date  03/10/18      PT LONG TERM GOAL #3   Title  Pt will demonstrate full, pain free, shoulder ROM in order to reach into cabinets, perform houshold activities and return to prior level of function.    Baseline  flex 100 deg, ABD 75 deg, ER 25, IR 15    Time  6    Period  Weeks    Status  New    Target Date  03/10/18      PT LONG TERM GOAL #4   Title  Pt will improve FOTO score from 49 to 62 in order to demonstrate an improvement in function.    Baseline  current FOTO: 49    Time  6    Period  Weeks    Status  New    Target Date  03/10/18            Plan - 02/10/18  1755    Clinical Impression Statement  Pt presents with improved tolerance to isometric wall push exercise (added to HEP). Pt continues to demonstrate tenderness to palpation along R shoulder musculature (biceps tendon, deltoid, upper trapezius). Decreased verbal and tactile cueing required for scapular retractions. Pt will continue to benefit from skilled PT in order to return to prior level of function.    Rehab Potential  Good    PT Frequency  2x / week    PT Duration  6 weeks    PT Treatment/Interventions  ADLs/Self Care Home Management;Aquatic Therapy;Cryotherapy;Electrical Stimulation;Moist Heat;Ultrasound;Therapeutic activities;Therapeutic exercise;Patient/family education;Manual techniques;Passive range of motion;Dry needling    PT Next Visit Plan  ER ROM,YTB ER/IR/AB/ADD     PT Home Exercise Plan  see education section    Consulted and Agree with Plan of Care  Patient       Patient will benefit from skilled therapeutic intervention in order to improve the following deficits and impairments:  Increased muscle spasms, Decreased range of motion, Impaired UE functional use, Pain, Decreased strength  Visit Diagnosis: Chronic right shoulder pain  Stiffness of right shoulder, not elsewhere classified     Problem List Patient Active Problem List   Diagnosis Date Noted  . Acute pain of right shoulder 01/06/2018  . Cervical stenosis (uterine cervix) 09/09/2017  . Elevated BP without diagnosis of hypertension 06/06/2017  . Monocular diplopia of right eye 10/22/2016  . Bilateral lower extremity edema 01/30/2016  . Obesity 12/27/2015  . Frequent headaches 10/27/2015  . Environmental allergies 10/27/2015  . DVT (deep venous thrombosis) ([place]) 10/27/2015  . Depression with anxiety 10/23/2015  . GERD (gastroesophageal reflux disease) 10/23/2015  . Vitamin D deficiency 10/23/2015    Georg Ruddle, SPT 02/10/2018, 5:58 PM  [facility] [address] [place], [address] Phone: [phone]   Fax:  [phone]  Name: Michele Long MRN: [id number] Date of Birth: 12-26-1950

## 2018-02-12 ENCOUNTER — Other Ambulatory Visit: Payer: Self-pay | Admitting: Family Medicine

## 2018-02-12 ENCOUNTER — Ambulatory Visit: Payer: PPO

## 2018-02-12 DIAGNOSIS — M25611 Stiffness of right shoulder, not elsewhere classified: Secondary | ICD-10-CM

## 2018-02-12 DIAGNOSIS — G8929 Other chronic pain: Secondary | ICD-10-CM

## 2018-02-12 DIAGNOSIS — M25511 Pain in right shoulder: Secondary | ICD-10-CM | POA: Diagnosis not present

## 2018-02-12 NOTE — Therapy (Signed)
[facility] [address], [place], [address] Phone: [phone]   Fax:  [phone]  Physical Therapy Treatment  Patient Details  Name: Michele Long MRN: [id number] Date of Birth: 1950-08-18 No data recorded  Encounter Date: 02/12/2018  PT End of Session - 02/12/18 1726    Visit Number  5    Number of Visits  13    Date for PT Re-Evaluation  03/10/18    PT Start Time  1430    PT Stop Time  1515    PT Time Calculation (min)  45 min    Activity Tolerance  Patient tolerated treatment well    Behavior During Therapy  [facility] for tasks assessed/performed       Past Medical History:  Diagnosis Date  . Allergy   . Depression   . DVT (deep venous thrombosis) ([place])   . Edema   . Frequent headaches   . Gastric ulcer 10/27/2015  . GERD (gastroesophageal reflux disease)   . HTN (hypertension)   . Microalbuminuria   . Migraine   . Shingles   . Symptomatic menopausal or female climacteric states   . UTI (lower urinary tract infection)     Past Surgical History:  Procedure Laterality Date  . AUGMENTATION MAMMAPLASTY Bilateral 1983  . BREAST ENHANCEMENT SURGERY  1983  . NASAL SEPTUM SURGERY  2000    There were no vitals filed for this visit.  Subjective Assessment - 02/12/18 1502    Subjective  Pt reports that shoulder overall feels that is improving and that pain is not as intense. Pt also states that she did not get the chance to do all exrecises in HEP every day since previous session but that she did complete scap retractions and AAROM shoulder flexion.    Limitations  House hold activities;Lifting    Patient Stated Goals  get back to everyday tasks, be able to do yardwork and reach up in cabinents    Currently in Pain?  Yes    Pain Score  2     Pain Location  Shoulder    Pain Orientation  Right    Pain Descriptors / Indicators  Aching    Pain Type  Chronic pain    Pain Onset  More than a month ago    Pain Frequency  Constant       TREATMENT Manual Therapy STM to R shoulder musculature with pt in supine in order to decrease pain and muscle spasms x10 min Grade II/III Inferior and posterior glenohumeral mobilizations to improve joint mobility, decrease pain, and improve AROM/Long, patient in supine with roll under knees x10 min total  Therapeutic Exercise Supine punch plus x15 (verbal and tactile cues for proper technique) Long shoulder flexion and ER in order to decrease pain and increase ROM x5 min Scapular retractions 10x3 (decreased verbal and tactile cueing required) 4 way isometrics against wall (flex, ext, IR, ER) with elbow at 90 deg, towel between elbow and trunk x10 each direction Resisted ER/IR/flex/ABD with YTB in pain free range x15 (no increase in pain)      Pt fatigued at end of treatment session. No increase in pain with therapeutic exercises .     PT Education - 02/12/18 1725    Education Details  Pt educated on proper form and technique for therapeutic exercises.     Person(s) Educated  Patient    Methods  Demonstration;Explanation    Comprehension  Verbalized understanding;Returned demonstration  PT Long Term Goals - 01/27/18 1823      PT LONG TERM GOAL #1   Title  Pt will be independent and compliant with HEP in order to maintain gains made in PT and return to prior level of funciton.    Baseline  Pt dependent on proper form and technique for HEP exercises    Time  4    Period  Weeks    Status  New    Target Date  02/24/18      PT LONG TERM GOAL #2   Title  Pt will report worst pain in past week less than 2/10 in order to return to prior level of function    Baseline  4/10    Time  6    Period  Weeks    Status  New    Target Date  03/10/18      PT LONG TERM GOAL #3   Title  Pt will demonstrate full, pain free, shoulder ROM in order to reach into cabinets, perform houshold activities and return to prior level of function.    Baseline   flex 100 deg, ABD 75 deg, ER 25, IR 15    Time  6    Period  Weeks    Status  New    Target Date  03/10/18      PT LONG TERM GOAL #4   Title  Pt will improve FOTO score from 49 to 62 in order to demonstrate an improvement in function.    Baseline  current FOTO: 49    Time  6    Period  Weeks    Status  New    Target Date  03/10/18            Plan - 02/12/18 1726    Clinical Impression Statement  Pt presents with improved shoulder flexion AROM and is able to achieve approximately 110 deg. Pt continues to improve tolerance to isometric wall push exercise and was able to tolerate ER, IR, flex and abd with YTB indicating decrease irritability/pain and improved strength. Pt will continue to benefit from skilled PT in order to return to prior level of function.    Rehab Potential  Good    Clinical Impairments Affecting Rehab Potential  +motivated    PT Frequency  2x / week    PT Duration  6 weeks    PT Treatment/Interventions  ADLs/Self Care Home Management;Aquatic Therapy;Cryotherapy;Electrical Stimulation;Moist Heat;Ultrasound;Therapeutic activities;Therapeutic exercise;Patient/family education;Manual techniques;Passive range of motion;Dry needling    PT Next Visit Plan  shoulder mobs, review YTB exercises     PT Home Exercise Plan  see education section    Consulted and Agree with Plan of Care  Patient       Patient will benefit from skilled therapeutic intervention in order to improve the following deficits and impairments:  Increased muscle spasms, Decreased range of motion, Impaired UE functional use, Pain, Decreased strength  Visit Diagnosis: Chronic right shoulder pain  Stiffness of right shoulder, not elsewhere classified     Problem List Patient Active Problem List   Diagnosis Date Noted  . Acute pain of right shoulder 01/06/2018  . Cervical stenosis (uterine cervix) 09/09/2017  . Elevated BP without diagnosis of hypertension 06/06/2017  . Monocular diplopia of  right eye 10/22/2016  . Bilateral lower extremity edema 01/30/2016  . Obesity 12/27/2015  . Frequent headaches 10/27/2015  . Environmental allergies 10/27/2015  . DVT (deep venous thrombosis) ([place]) 10/27/2015  . Depression with anxiety  10/23/2015  . GERD (gastroesophageal reflux disease) 10/23/2015  . Vitamin D deficiency 10/23/2015    Georg Ruddle, SPT 02/12/2018, 5:34 PM  [facility] [address], [place], [address] Phone: [phone]   Fax:  [phone]  Name: Charleigh Correnti MRN: [id number] Date of Birth: 1951/06/23

## 2018-02-17 ENCOUNTER — Ambulatory Visit: Payer: PPO

## 2018-02-17 DIAGNOSIS — M25511 Pain in right shoulder: Principal | ICD-10-CM

## 2018-02-17 DIAGNOSIS — M25611 Stiffness of right shoulder, not elsewhere classified: Secondary | ICD-10-CM

## 2018-02-17 DIAGNOSIS — G8929 Other chronic pain: Secondary | ICD-10-CM

## 2018-02-17 NOTE — Therapy (Signed)
[facility] [address], [place], [address] Phone: [phone]   Fax:  [phone]  Physical Therapy Treatment  Patient Details  Name: Michele Long MRN: [id number] Date of Birth: 10-04-1950 No data recorded  Encounter Date: 02/17/2018  PT End of Session - 02/17/18 1911    Visit Number  6    Number of Visits  13    Date for PT Re-Evaluation  03/10/18    PT Start Time  1430    PT Stop Time  1515    PT Time Calculation (min)  45 min    Activity Tolerance  Patient tolerated treatment well    Behavior During Therapy  [facility] for tasks assessed/performed       Past Medical History:  Diagnosis Date  . Allergy   . Depression   . DVT (deep venous thrombosis) ([place])   . Edema   . Frequent headaches   . Gastric ulcer 10/27/2015  . GERD (gastroesophageal reflux disease)   . HTN (hypertension)   . Microalbuminuria   . Migraine   . Shingles   . Symptomatic menopausal or female climacteric states   . UTI (lower urinary tract infection)     Past Surgical History:  Procedure Laterality Date  . AUGMENTATION MAMMAPLASTY Bilateral 1983  . BREAST ENHANCEMENT SURGERY  1983  . NASAL SEPTUM SURGERY  2000    There were no vitals filed for this visit.  Subjective Assessment - 02/17/18 1908    Subjective  Pt reports that shoulder is sore and does not feel significantly different compared to last week. Pt also states that she did not complete HEP this weekend and that overall she did not have a very good weekend.     Limitations  House hold activities;Lifting    Patient Stated Goals  get back to everyday tasks, be able to do yardwork and reach up in cabinents    Currently in Pain?  Yes    Pain Score  3     Pain Location  Shoulder    Pain Orientation  Right    Pain Descriptors / Indicators  Aching    Pain Type  Chronic pain    Pain Onset  More than a month ago        TREATMENT Manual Therapy Grade II/III  Inferior and posterior glenohumeral mobilizations to improve joint mobility, decrease pain, and improve AROM/PROM, patient in supine with roll under knees x10 min total   Therapeutic Exercise PROM shoulder flexion and ER in order to decrease pain and increase ROM x5 min 4 way isometrics against wall (flex, ext, IR, ER) with elbow at 90 deg, towel between elbow and trunk x10 each direction Wall ER stretch (slow walk out into increased ER) 30sx5  Modalities  Estim with heat to R shoulder to decrease muscle spasms and pain-- pt sitting with 4 electrodes (post delt, teres minor, suprascap and coracoid process) high volt x15 min   Pt reports decreased shoulder pain after therapeutic exercises and modalities. Pt fatigued at end of treatment session.    PT Education - 02/17/18 1910    Education Details  Pt educated on proper form and technique for therapeutic exercises.     Person(s) Educated  Patient    Methods  Explanation;Demonstration    Comprehension  Verbalized understanding;Returned demonstration          PT Long Term Goals - 01/27/18 1823      PT LONG TERM GOAL #1  Title  Pt will be independent and compliant with HEP in order to maintain gains made in PT and return to prior level of funciton.    Baseline  Pt dependent on proper form and technique for HEP exercises    Time  4    Period  Weeks    Status  New    Target Date  02/24/18      PT LONG TERM GOAL #2   Title  Pt will report worst pain in past week less than 2/10 in order to return to prior level of function    Baseline  4/10    Time  6    Period  Weeks    Status  New    Target Date  03/10/18      PT LONG TERM GOAL #3   Title  Pt will demonstrate full, pain free, shoulder ROM in order to reach into cabinets, perform houshold activities and return to prior level of function.    Baseline  flex 100 deg, ABD 75 deg, ER 25, IR 15    Time  6    Period  Weeks    Status  New    Target Date  03/10/18      PT LONG TERM  GOAL #4   Title  Pt will improve FOTO score from 49 to 62 in order to demonstrate an improvement in function.    Baseline  current FOTO: 49    Time  6    Period  Weeks    Status  New    Target Date  03/10/18            Plan - 02/17/18 1912    Clinical Impression Statement  Pt continues to present with shoulder flexion, abduction, and external rotation limitations secondary to pain with passive and active movement. Pt requires decreased cueing and set up assistance for isometric wall push exercise. Pt reports decreased pain after estim and heat at end of session. Pt will benefit from continued skilled PT in order to return to prior level of function.    Rehab Potential  Good    Clinical Impairments Affecting Rehab Potential  +motivated    PT Frequency  2x / week    PT Duration  6 weeks    PT Treatment/Interventions  ADLs/Self Care Home Management;Aquatic Therapy;Cryotherapy;Electrical Stimulation;Moist Heat;Ultrasound;Therapeutic activities;Therapeutic exercise;Patient/family education;Manual techniques;Passive range of motion;Dry needling    PT Next Visit Plan  shoulder mobs, YTB exercises     PT Home Exercise Plan  see education section    Consulted and Agree with Plan of Care  Patient       Patient will benefit from skilled therapeutic intervention in order to improve the following deficits and impairments:  Increased muscle spasms, Decreased range of motion, Impaired UE functional use, Pain, Decreased strength  Visit Diagnosis: Chronic right shoulder pain  Stiffness of right shoulder, not elsewhere classified     Problem List Patient Active Problem List   Diagnosis Date Noted  . Acute pain of right shoulder 01/06/2018  . Cervical stenosis (uterine cervix) 09/09/2017  . Elevated BP without diagnosis of hypertension 06/06/2017  . Monocular diplopia of right eye 10/22/2016  . Bilateral lower extremity edema 01/30/2016  . Obesity 12/27/2015  . Frequent headaches  10/27/2015  . Environmental allergies 10/27/2015  . DVT (deep venous thrombosis) ([place]) 10/27/2015  . Depression with anxiety 10/23/2015  . GERD (gastroesophageal reflux disease) 10/23/2015  . Vitamin D deficiency 10/23/2015    Georg Ruddle,  SPT 02/17/2018, 7:16 PM  [facility] [address], [place], [address] Phone: [phone]   Fax:  [phone]  Name: Michele Long MRN: [id number] Date of Birth: 26-Feb-1951

## 2018-02-19 ENCOUNTER — Ambulatory Visit: Payer: PPO

## 2018-02-19 DIAGNOSIS — G8929 Other chronic pain: Secondary | ICD-10-CM

## 2018-02-19 DIAGNOSIS — M25511 Pain in right shoulder: Secondary | ICD-10-CM | POA: Diagnosis not present

## 2018-02-19 DIAGNOSIS — M25611 Stiffness of right shoulder, not elsewhere classified: Secondary | ICD-10-CM

## 2018-02-19 NOTE — Therapy (Signed)
[facility] [address], [place], [address] Phone: [phone]   Fax:  [phone]  Physical Therapy Treatment  Patient Details  Name: Michele Long MRN: [id number] Date of Birth: July 29, 1951 No data recorded  Encounter Date: 02/19/2018  PT End of Session - 02/19/18 1524    Visit Number  7    Number of Visits  13    Date for PT Re-Evaluation  03/10/18    PT Start Time  1430    PT Stop Time  1515    PT Time Calculation (min)  45 min    Activity Tolerance  Patient tolerated treatment well    Behavior During Therapy  [facility] for tasks assessed/performed       Past Medical History:  Diagnosis Date  . Allergy   . Depression   . DVT (deep venous thrombosis) ([place])   . Edema   . Frequent headaches   . Gastric ulcer 10/27/2015  . GERD (gastroesophageal reflux disease)   . HTN (hypertension)   . Microalbuminuria   . Migraine   . Shingles   . Symptomatic menopausal or female climacteric states   . UTI (lower urinary tract infection)     Past Surgical History:  Procedure Laterality Date  . AUGMENTATION MAMMAPLASTY Bilateral 1983  . BREAST ENHANCEMENT SURGERY  1983  . NASAL SEPTUM SURGERY  2000    There were no vitals filed for this visit.  Subjective Assessment - 02/19/18 1521    Subjective  Pt states that shoulder is feeling "ok" and that it is a little sore from ocmpleting HEP the past two days. Pt also states that she is having difficulty washing har with RUE.      Limitations  House hold activities;Lifting    Patient Stated Goals  get back to everyday tasks, be able to do yardwork and reach up in cabinents    Currently in Pain?  Yes    Pain Score  2     Pain Location  Shoulder    Pain Orientation  Right    Pain Descriptors / Indicators  Aching    Pain Type  Chronic pain    Pain Onset  More than a month ago    Pain Frequency  Constant         TREATMENT Manual Therapy  Grade II/III Inferior  and posterior glenohumeral mobilizations to improve joint mobility, decrease pain, and improve AROM/PROM, patient in supine with roll under knees x15 min total  Therapeutic Exercises PROM shoulder flexion, ER, IR and abd in order to decrease pain and increase ROM x5 min 4 way isometrics against wall (flex, ext, IR, ER) with elbow at 90 deg, towel between elbow and trunk x10 each direction Scapular retractions x20 (improved form)  Wall ER stretch beginning at 90 deg abduction (slow walk out into increased ER) 30sx5 ER/IR/flex/ext with YTB x10  "W" exercise with YTB x10 (added to HEP)  Pt demonstrates increased R shoulder fatigue at end of treatment session.      PT Education - 02/19/18 1523    Education Details  Pt educated on proper form and technique for therapeutic exercises. "W" exercise with YTB added to HEP.    Person(s) Educated  Patient    Methods  Explanation;Demonstration    Comprehension  Verbalized understanding;Returned demonstration          PT Long Term Goals - 01/27/18 1823      PT LONG TERM GOAL #1  Title  Pt will be independent and compliant with HEP in order to maintain gains made in PT and return to prior level of funciton.    Baseline  Pt dependent on proper form and technique for HEP exercises    Time  4    Period  Weeks    Status  New    Target Date  02/24/18      PT LONG TERM GOAL #2   Title  Pt will report worst pain in past week less than 2/10 in order to return to prior level of function    Baseline  4/10    Time  6    Period  Weeks    Status  New    Target Date  03/10/18      PT LONG TERM GOAL #3   Title  Pt will demonstrate full, pain free, shoulder ROM in order to reach into cabinets, perform houshold activities and return to prior level of function.    Baseline  flex 100 deg, ABD 75 deg, ER 25, IR 15    Time  6    Period  Weeks    Status  New    Target Date  03/10/18      PT LONG TERM GOAL #4   Title  Pt will improve FOTO score from  49 to 62 in order to demonstrate an improvement in function.    Baseline  current FOTO: 49    Time  6    Period  Weeks    Status  New    Target Date  03/10/18            Plan - 02/19/18 1524    Clinical Impression Statement  Pt presents with decreased pain with active shoulder flexion and abduction today. Pt is able able to achieve increased ER PROM (~30 deg).  No increase in pain with YTB therapeutic exercises. Pt demonstrates improved ability to perform scapular retractions without cueing. Pt will benefit from continued skilled PT in order to return to prior level of function.     Rehab Potential  Good    Clinical Impairments Affecting Rehab Potential  +motivated    PT Frequency  2x / week    PT Duration  6 weeks    PT Treatment/Interventions  ADLs/Self Care Home Management;Aquatic Therapy;Cryotherapy;Electrical Stimulation;Moist Heat;Ultrasound;Therapeutic activities;Therapeutic exercise;Patient/family education;Manual techniques;Passive range of motion;Dry needling    PT Next Visit Plan  review "w" exercise, estim,     PT Home Exercise Plan  see education section    Consulted and Agree with Plan of Care  Patient       Patient will benefit from skilled therapeutic intervention in order to improve the following deficits and impairments:  Increased muscle spasms, Decreased range of motion, Impaired UE functional use, Pain, Decreased strength  Visit Diagnosis: Chronic right shoulder pain  Stiffness of right shoulder, not elsewhere classified     Problem List Patient Active Problem List   Diagnosis Date Noted  . Acute pain of right shoulder 01/06/2018  . Cervical stenosis (uterine cervix) 09/09/2017  . Elevated BP without diagnosis of hypertension 06/06/2017  . Monocular diplopia of right eye 10/22/2016  . Bilateral lower extremity edema 01/30/2016  . Obesity 12/27/2015  . Frequent headaches 10/27/2015  . Environmental allergies 10/27/2015  . DVT (deep venous  thrombosis) ([place]) 10/27/2015  . Depression with anxiety 10/23/2015  . GERD (gastroesophageal reflux disease) 10/23/2015  . Vitamin D deficiency 10/23/2015    Georg Ruddle, SPT 02/19/2018,  3:30 PM  [facility] [address] [place], [address] Phone: [phone]   Fax:  [phone]  Name: Michele Long MRN: [id number] Date of Birth: July 11, 1951

## 2018-02-23 ENCOUNTER — Ambulatory Visit: Payer: PPO

## 2018-02-23 DIAGNOSIS — M25511 Pain in right shoulder: Secondary | ICD-10-CM | POA: Diagnosis not present

## 2018-02-23 DIAGNOSIS — M25611 Stiffness of right shoulder, not elsewhere classified: Secondary | ICD-10-CM

## 2018-02-23 DIAGNOSIS — G8929 Other chronic pain: Secondary | ICD-10-CM

## 2018-02-23 NOTE — Therapy (Signed)
[facility] [address], [place], [address] Phone: [phone]   Fax:  [phone]  Physical Therapy Treatment  Patient Details  Name: Michele Long MRN: [id number] Date of Birth: [birth year] No data recorded  Encounter Date: 02/23/2018  PT End of Session - 02/23/18 1833    Visit Number  8    Number of Visits  13    Date for PT Re-Evaluation  03/10/18    PT Start Time  1700    PT Stop Time  1745    PT Time Calculation (min)  45 min    Activity Tolerance  Patient tolerated treatment well    Behavior During Therapy  [facility] for tasks assessed/performed       Past Medical History:  Diagnosis Date  . Allergy   . Depression   . DVT (deep venous thrombosis) ([place])   . Edema   . Frequent headaches   . Gastric ulcer 10/27/2015  . GERD (gastroesophageal reflux disease)   . HTN (hypertension)   . Microalbuminuria   . Migraine   . Shingles   . Symptomatic menopausal or female climacteric states   . UTI (lower urinary tract infection)     Past Surgical History:  Procedure Laterality Date  . AUGMENTATION MAMMAPLASTY Bilateral 1983  . BREAST ENHANCEMENT SURGERY  1983  . NASAL SEPTUM SURGERY  2000    There were no vitals filed for this visit.  Subjective Assessment - 02/23/18 1755    Subjective  Pt reports that shoulder is sore and does not feel any better. Pt also states that she completed her HEP every day since previous session.     Limitations  House hold activities;Lifting    Patient Stated Goals  get back to everyday tasks, be able to do yardwork and reach up in cabinents    Currently in Pain?  Yes    Pain Score  3     Pain Location  Shoulder    Pain Orientation  Right    Pain Descriptors / Indicators  Aching    Pain Type  Chronic pain    Pain Onset  More than a month ago         TREATMENT Manual Therapy  Grade II/III Inferior and posterior glenohumeral mobilizations to improve joint mobility,  decrease pain, and improve AROM/PROM, patient in supine with roll under knees x15 min total   Therapeutic Exercises PROM shoulder flexion, ER, IR and abd in order to decrease pain and increase ROM x5 min Cervical retractions x15 (verbal cueing for proper form, added to HEP) Scapular retractions x10  Wall ER stretch beginning with elbow at side (slow walk out into increased ER) 30sx5 ER/IR/flex/ext with YTB x10   Modalities  Estim with heat to R shoulder to decrease muscle spasms and pain-- pt sitting with 4 electrodes (post delt, teres minor, suprascap and coracoid process) high volt x15 min   Pt reports decreased shoulder pain after electrical stimulation and heat. Pt fatigued at end of treatment session.       PT Education - 02/23/18 1758    Education Details  Pt educated on proper form and technique for therapeutic  exercises. Cervical retraction added to HEP.     Person(s) Educated  Patient    Methods  Explanation;Demonstration    Comprehension  Verbalized understanding;Returned demonstration          PT Long Term Goals - 01/27/18 1823      PT LONG  TERM GOAL #1   Title  Pt will be independent and compliant with HEP in order to maintain gains made in PT and return to prior level of funciton.    Baseline  Pt dependent on proper form and technique for HEP exercises    Time  4    Period  Weeks    Status  New    Target Date  02/24/18      PT LONG TERM GOAL #2   Title  Pt will report worst pain in past week less than 2/10 in order to return to prior level of function    Baseline  4/10    Time  6    Period  Weeks    Status  New    Target Date  03/10/18      PT LONG TERM GOAL #3   Title  Pt will demonstrate full, pain free, shoulder ROM in order to reach into cabinets, perform houshold activities and return to prior level of function.    Baseline  flex 100 deg, ABD 75 deg, ER 25, IR 15    Time  6    Period  Weeks    Status  New    Target Date  03/10/18      PT  LONG TERM GOAL #4   Title  Pt will improve FOTO score from 49 to 62 in order to demonstrate an improvement in function.    Baseline  current FOTO: 49    Time  6    Period  Weeks    Status  New    Target Date  03/10/18            Plan - 02/23/18 1835    Clinical Impression Statement  Since previous session, patient presents with no significant differences in regards to active shoulder flexion and abduction AROM. Pt continues to present with an increase in shoulder pain at end range during flexion, abd, ER, and IR AROM/PROM. Pt required significant verbal cueing to achieve proper cervical retraction (review this next session). Pt also presents with decreased scapular and upper thoracic motion which should be addressed next session (assess thoracic spine mobility and scapular mobility). Pt will benefit from continued skilled PT in order to return to prior level of function.    Rehab Potential  Good    Clinical Impairments Affecting Rehab Potential  +motivated    PT Frequency  2x / week    PT Duration  6 weeks    PT Treatment/Interventions  ADLs/Self Care Home Management;Aquatic Therapy;Cryotherapy;Electrical Stimulation;Moist Heat;Ultrasound;Therapeutic activities;Therapeutic exercise;Patient/family education;Manual techniques;Passive range of motion;Dry needling    PT Next Visit Plan  assess thoracic mobility and scapular moblity, postural education, supine isometirc cervical retraction    Consulted and Agree with Plan of Care  Patient       Patient will benefit from skilled therapeutic intervention in order to improve the following deficits and impairments:  Increased muscle spasms, Decreased range of motion, Impaired UE functional use, Pain, Decreased strength  Visit Diagnosis: Chronic right shoulder pain  Stiffness of right shoulder, not elsewhere classified     Problem List Patient Active Problem List   Diagnosis Date Noted  . Acute pain of right shoulder 01/06/2018  .  Cervical stenosis (uterine cervix) 09/09/2017  . Elevated BP without diagnosis of hypertension 06/06/2017  . Monocular diplopia of right eye 10/22/2016  . Bilateral lower extremity edema 01/30/2016  . Obesity 12/27/2015  . Frequent headaches 10/27/2015  . Environmental allergies 10/27/2015  .  DVT (deep venous thrombosis) ([place]) 10/27/2015  . Depression with anxiety 10/23/2015  . GERD (gastroesophageal reflux disease) 10/23/2015  . Vitamin D deficiency 10/23/2015    Georg Ruddle, SPT 02/23/2018, 6:46 PM  [facility] [address], [place], [address] Phone: [phone]   Fax:  [phone]  Name: Michele Long MRN: [id number] Date of Birth: 1951/03/13

## 2018-02-24 ENCOUNTER — Ambulatory Visit: Payer: PPO

## 2018-02-25 ENCOUNTER — Ambulatory Visit: Payer: PPO

## 2018-02-25 ENCOUNTER — Other Ambulatory Visit: Payer: Self-pay | Admitting: Family Medicine

## 2018-02-25 DIAGNOSIS — G8929 Other chronic pain: Secondary | ICD-10-CM

## 2018-02-25 DIAGNOSIS — M25511 Pain in right shoulder: Principal | ICD-10-CM

## 2018-02-25 DIAGNOSIS — M25611 Stiffness of right shoulder, not elsewhere classified: Secondary | ICD-10-CM

## 2018-02-25 NOTE — Therapy (Signed)
[facility] [address], [place], [address] Phone: [phone]   Fax:  [phone]  Physical Therapy Treatment  Patient Details  Name: Michele Long MRN: [id number] Date of Birth: May 25, 1951 No data recorded  Encounter Date: 02/25/2018  PT End of Session - 02/25/18 1011    Visit Number  9    Number of Visits  13    Date for PT Re-Evaluation  03/10/18    PT Start Time  0915    PT Stop Time  0100    PT Time Calculation (min)  945 min    Activity Tolerance  Patient tolerated treatment well    Behavior During Therapy  [facility] for tasks assessed/performed       Past Medical History:  Diagnosis Date  . Allergy   . Depression   . DVT (deep venous thrombosis) ([place])   . Edema   . Frequent headaches   . Gastric ulcer 10/27/2015  . GERD (gastroesophageal reflux disease)   . HTN (hypertension)   . Microalbuminuria   . Migraine   . Shingles   . Symptomatic menopausal or female climacteric states   . UTI (lower urinary tract infection)     Past Surgical History:  Procedure Laterality Date  . AUGMENTATION MAMMAPLASTY Bilateral 1983  . BREAST ENHANCEMENT SURGERY  1983  . NASAL SEPTUM SURGERY  2000    There were no vitals filed for this visit.  Subjective Assessment - 02/25/18 1005    Subjective  Pt states that shoulder is constantly aching and she does not get much relief. Pt also states that she has been completing HEP one time each day on days that she does not have physical therapy.     Limitations  House hold activities;Lifting    Patient Stated Goals  get back to everyday tasks, be able to do yardwork and reach up in cabinents    Currently in Pain?  Yes    Pain Score  3     Pain Location  Shoulder    Pain Orientation  Right    Pain Descriptors / Indicators  Aching    Pain Type  Chronic pain    Pain Onset  More than a month ago    Pain Frequency  Constant        TREATMENT Manual Therapy Grade  II/III Inferior and posterior glenohumeral mobilizations to improve joint mobility, decrease pain, and improve AROM/PROM, patient in supine with roll under knees x72min total  Scapular mobilizations (superior, inferior, upward rotation, downward rotation) to increase mobility of scapula throughout shoulder AROM and decrease muscle spasms and pain x5 min total  Therapeutic Exercises PROM shoulder flexion, ER, IR and abd in order to decrease pain and increase ROM x5 min Cervical retractions + scapular retractions x15 (improved form today) Thoracic extension over chair x10 (verbal cueing and tactile cueing required for proper technique, added to HEP) Wall angels in pain free range with focus on maintaining neutral posture (shoulder retraction +cervical retraction) x10 (added to HEP) ER/IR/flex/ext with YTB x10   Pt demonstrates increased R shoulder fatigue at end of treatment session.       PT Education - 02/25/18 1009    Education Details  Patient educated on proper form and technique for therapeutic exerciess. Patient also educated on improtance of posture and how this can effect the shoulder muscualture. Two additional exercises added to HEP ( thoracic extension over chair and wall angels)    Person(s) Educated  Patient    Methods  Explanation;Demonstration    Comprehension  Verbalized understanding;Returned demonstration          PT Long Term Goals - 01/27/18 1823      PT LONG TERM GOAL #1   Title  Pt will be independent and compliant with HEP in order to maintain gains made in PT and return to prior level of funciton.    Baseline  Pt dependent on proper form and technique for HEP exercises    Time  4    Period  Weeks    Status  New    Target Date  02/24/18      PT LONG TERM GOAL #2   Title  Pt will report worst pain in past week less than 2/10 in order to return to prior level of function    Baseline  4/10    Time  6    Period  Weeks    Status  New    Target Date   03/10/18      PT LONG TERM GOAL #3   Title  Pt will demonstrate full, pain free, shoulder ROM in order to reach into cabinets, perform houshold activities and return to prior level of function.    Baseline  flex 100 deg, ABD 75 deg, ER 25, IR 15    Time  6    Period  Weeks    Status  New    Target Date  03/10/18      PT LONG TERM GOAL #4   Title  Pt will improve FOTO score from 49 to 62 in order to demonstrate an improvement in function.    Baseline  current FOTO: 49    Time  6    Period  Weeks    Status  New    Target Date  03/10/18            Plan - 02/25/18 1012    Clinical Impression Statement  Pt presents with decreased scapular mobility in upward and downward rotation. Pt also presents with decreased mobility along thoracic spine and increased thoracic kyphosis. Pt continues to present with significant tenderness to palpation along shoulder muscuature. Pt demonstrates decreased tolerance to maintaining postural exercises (wall angels, shoulder retraction + chin tuck) and fatigues quickly. Pt will continue to benefit from skilled PT in order to return to prior level of function.    Rehab Potential  Good    Clinical Impairments Affecting Rehab Potential  +motivated    PT Frequency  2x / week    PT Duration  6 weeks    PT Treatment/Interventions  ADLs/Self Care Home Management;Aquatic Therapy;Cryotherapy;Electrical Stimulation;Moist Heat;Ultrasound;Therapeutic activities;Therapeutic exercise;Patient/family education;Manual techniques;Passive range of motion;Dry needling    PT Next Visit Plan  wall angels, thoracic ext mobility, thoracic mobility exercises, scapular mobs    PT Home Exercise Plan  see education section    Consulted and Agree with Plan of Care  Patient       Patient will benefit from skilled therapeutic intervention in order to improve the following deficits and impairments:  Increased muscle spasms, Decreased range of motion, Impaired UE functional use, Pain,  Decreased strength  Visit Diagnosis: Chronic right shoulder pain  Stiffness of right shoulder, not elsewhere classified     Problem List Patient Active Problem List   Diagnosis Date Noted  . Acute pain of right shoulder 01/06/2018  . Cervical stenosis (uterine cervix) 09/09/2017  . Elevated BP without diagnosis of hypertension 06/06/2017  . Monocular  diplopia of right eye 10/22/2016  . Bilateral lower extremity edema 01/30/2016  . Obesity 12/27/2015  . Frequent headaches 10/27/2015  . Environmental allergies 10/27/2015  . DVT (deep venous thrombosis) ([place]) 10/27/2015  . Depression with anxiety 10/23/2015  . GERD (gastroesophageal reflux disease) 10/23/2015  . Vitamin D deficiency 10/23/2015    Georg Ruddle, SPT 02/25/2018, 10:16 AM  [facility] [address], [place], [address] Phone: [phone]   Fax:  [phone]  Name: Michele Long MRN: [id number] Date of Birth: Jan 06, 1951

## 2018-02-27 NOTE — Telephone Encounter (Signed)
Refill sent to pharmacy.  Please contact the patient and see how her shoulder is doing.  Please see if she has continued to need the ibuprofen.

## 2018-02-27 NOTE — Telephone Encounter (Signed)
Last OV 01/06/18 last filled 01/30/18 30 0rf

## 2018-03-03 ENCOUNTER — Ambulatory Visit: Payer: PPO

## 2018-03-05 ENCOUNTER — Ambulatory Visit: Payer: PPO

## 2018-03-05 NOTE — Telephone Encounter (Signed)
Patient states it is slowly getting better. She uses the ibuprofen as needed. She states she takes 1 tablet before she goes to physical therapy.

## 2018-03-10 ENCOUNTER — Ambulatory Visit: Payer: Self-pay | Admitting: Family Medicine

## 2018-03-10 ENCOUNTER — Ambulatory Visit: Payer: PPO | Attending: Family Medicine

## 2018-03-10 DIAGNOSIS — G8929 Other chronic pain: Secondary | ICD-10-CM | POA: Diagnosis not present

## 2018-03-10 DIAGNOSIS — M25511 Pain in right shoulder: Secondary | ICD-10-CM | POA: Diagnosis not present

## 2018-03-10 DIAGNOSIS — M25611 Stiffness of right shoulder, not elsewhere classified: Secondary | ICD-10-CM | POA: Diagnosis not present

## 2018-03-10 NOTE — Therapy (Signed)
This entire session was performed under the direct supervision of a liscensed physical therapist.  11:05 AM, 03/11/18 Etta Grandchild, PT, DPT Physical Therapist - [place] [phone] (Office)      [facility] [address] [place], [address] Phone: [phone]   Fax:  [phone]  Physical Therapy Treatment  Patient Details  Name: Michele Long MRN: [id number] Date of Birth: Jan 07, 1951 No data recorded  Encounter Date: 03/10/2018  PT End of Session - 03/10/18 1912    Visit Number  10    Number of Visits  13    Date for PT Re-Evaluation  03/10/18    PT Start Time  1430    PT Stop Time  1515    PT Time Calculation (min)  45 min    Activity Tolerance  Patient tolerated treatment well    Behavior During Therapy  [facility] for tasks assessed/performed       Past Medical History:  Diagnosis Date  . Allergy   . Depression   . DVT (deep venous thrombosis) ([place])   . Edema   . Frequent headaches   . Gastric ulcer 10/27/2015  . GERD (gastroesophageal reflux disease)   . HTN (hypertension)   . Microalbuminuria   . Migraine   . Shingles   . Symptomatic menopausal or female climacteric states   . UTI (lower urinary tract infection)     Past Surgical History:  Procedure Laterality Date  . AUGMENTATION MAMMAPLASTY Bilateral 1983  . BREAST ENHANCEMENT SURGERY  1983  . NASAL SEPTUM SURGERY  2000    There were no vitals filed for this visit.  Subjective Assessment - 03/10/18 1910    Subjective  Pt reports that she was sick all last week and did not complete HEP. Pt feels that she may have regressed slightly but that shoulder is not feeling much different.     Limitations  House hold activities;Lifting    Patient Stated Goals  get back to everyday tasks, be able to do yardwork and reach up in cabinents    Currently in Pain?  Yes    Pain Score  3     Pain Location  Shoulder    Pain Orientation  Right     Pain Descriptors / Indicators  Aching    Pain Type  Chronic pain    Pain Onset  More than a month ago        TREATMENT Manual Therapy  Grade II/III Inferior and posterior glenohumeral mobilizations to improve joint mobility, decrease pain, and improve AROM/PROM, patient in supine with roll under knees x10 min total   Scapular mobilizations (superior, inferior, upward rotation, downward rotation) to increase mobility of scapula throughout shoulder AROM and decrease muscle spasms and pain x5 min total  STM to L subscpularis muscle with patient in sidelying to decrease pain and muscle spasms x5 min   Therapeutic Exercises PROM shoulder flexion, ER, IR and abd in order to decrease pain and increase ROM x5 min Cervical retractions + scapular retractions 2x10 4 way isometrics against wall (flex, ext, IR, ER) with elbow at 90 deg, towel between elbow and trunkx10 each direction Pulleys (flexion and scaption) in pain free range x30    Pt demonstrates increased R shoulder fatigue at end of treatment session.      PT Education - 03/10/18 1911    Education Details  Patient educated on proper form and technique for therapeutic exercises.  Person(s) Educated  Patient    Methods  Explanation;Demonstration    Comprehension  Verbalized understanding;Returned demonstration          PT Long Term Goals - 01/27/18 1823      PT LONG TERM GOAL #1   Title  Pt will be independent and compliant with HEP in order to maintain gains made in PT and return to prior level of funciton.    Baseline  Pt dependent on proper form and technique for HEP exercises    Time  4    Period  Weeks    Status  New    Target Date  02/24/18      PT LONG TERM GOAL #2   Title  Pt will report worst pain in past week less than 2/10 in order to return to prior level of function    Baseline  4/10    Time  6    Period  Weeks    Status  New    Target Date  03/10/18      PT LONG TERM GOAL #3   Title  Pt will  demonstrate full, pain free, shoulder ROM in order to reach into cabinets, perform houshold activities and return to prior level of function.    Baseline  flex 100 deg, ABD 75 deg, ER 25, IR 15    Time  6    Period  Weeks    Status  New    Target Date  03/10/18      PT LONG TERM GOAL #4   Title  Pt will improve FOTO score from 49 to 62 in order to demonstrate an improvement in function.    Baseline  current FOTO: 49    Time  6    Period  Weeks    Status  New    Target Date  03/10/18            Plan - 03/11/18 0750    Clinical Impression Statement  Pt presents with increased tenderness along R shoulder musculature compared to previous session. Pt also presents with improved scpaular mobility observed during scapular retractions and scapular mobilizations. Increased muscle spasm palpated on pt's R subscapularis muscle. Pt will beenfit from continued skilled PT in order to return to prior level of function.    Rehab Potential  Good    Clinical Impairments Affecting Rehab Potential  +motivated    PT Frequency  2x / week    PT Duration  6 weeks    PT Treatment/Interventions  ADLs/Self Care Home Management;Aquatic Therapy;Cryotherapy;Electrical Stimulation;Moist Heat;Ultrasound;Therapeutic activities;Therapeutic exercise;Patient/family education;Manual techniques;Passive range of motion;Dry needling    PT Next Visit Plan  review thoracic mobility exercises, STM to R subscapularis muscle    PT Home Exercise Plan  see education section    Consulted and Agree with Plan of Care  Patient       Patient will benefit from skilled therapeutic intervention in order to improve the following deficits and impairments:  Increased muscle spasms, Decreased range of motion, Impaired UE functional use, Pain, Decreased strength  Visit Diagnosis: Chronic right shoulder pain  Stiffness of right shoulder, not elsewhere classified     Problem List Patient Active Problem List   Diagnosis Date Noted   . Acute pain of right shoulder 01/06/2018  . Cervical stenosis (uterine cervix) 09/09/2017  . Elevated BP without diagnosis of hypertension 06/06/2017  . Monocular diplopia of right eye 10/22/2016  . Bilateral lower extremity edema 01/30/2016  . Obesity 12/27/2015  .  Frequent headaches 10/27/2015  . Environmental allergies 10/27/2015  . DVT (deep venous thrombosis) ([place]) 10/27/2015  . Depression with anxiety 10/23/2015  . GERD (gastroesophageal reflux disease) 10/23/2015  . Vitamin D deficiency 10/23/2015    Georg Ruddle, SPT 03/11/2018, 7:55 AM  [facility] [address], [place], [address] Phone: [phone]   Fax:  [phone]  Name: Michele Long MRN: [id number] Date of Birth: 05-05-1951

## 2018-03-12 ENCOUNTER — Ambulatory Visit: Payer: PPO

## 2018-03-12 DIAGNOSIS — M25511 Pain in right shoulder: Secondary | ICD-10-CM | POA: Diagnosis not present

## 2018-03-12 DIAGNOSIS — M25611 Stiffness of right shoulder, not elsewhere classified: Secondary | ICD-10-CM

## 2018-03-12 DIAGNOSIS — G8929 Other chronic pain: Secondary | ICD-10-CM

## 2018-03-12 NOTE — Therapy (Signed)
This entire session was performed under the direct supervision of a liscensed physical therapist. 12:30 PM, 03/13/18 Etta Grandchild, PT, DPT Physical Therapist - [place] [phone] (Office)      [place] [facility] [address], [place], [address] Phone: [phone]   Fax:  [phone]  Physical Therapy Treatment  Patient Details  Name: Michele Long MRN: [id number] Date of Birth: Sep 16, 1950 No data recorded  Encounter Date: 03/12/2018  PT End of Session - 03/13/18 1227    Visit Number  11    Number of Visits  13    Date for PT Re-Evaluation  03/10/18    PT Start Time  1430    PT Stop Time  1515    PT Time Calculation (min)  45 min    Activity Tolerance  Patient tolerated treatment well    Behavior During Therapy  [facility] for tasks assessed/performed       Past Medical History:  Diagnosis Date  . Allergy   . Depression   . DVT (deep venous thrombosis) ([place])   . Edema   . Frequent headaches   . Gastric ulcer 10/27/2015  . GERD (gastroesophageal reflux disease)   . HTN (hypertension)   . Microalbuminuria   . Migraine   . Shingles   . Symptomatic menopausal or female climacteric states   . UTI (lower urinary tract infection)     Past Surgical History:  Procedure Laterality Date  . AUGMENTATION MAMMAPLASTY Bilateral 1983  . BREAST ENHANCEMENT SURGERY  1983  . NASAL SEPTUM SURGERY  2000    There were no vitals filed for this visit.   TREATMENT Manual Therapy  Scapular mobilizations (superior, inferior, upward rotation, downward rotation) to increase mobility of scapula throughout shoulder AROM and decrease muscle spasms and pain x5 min total   STM to L subscpularis muscle with patient in sidelying to decrease pain and muscle spasms x10 min   Therapeutic Exercises Cervical retractions + scapular retractions 2x10 4 way isometrics against wall (flex, ext, IR, ER) with elbow at 90 deg, towel  between elbow and trunk x10 each direction Corner pec stretch with RLE only and shoulder abducted to ~45deg 30s x5 YTB shoulder extension (lat pull down) x10  YTB ER/IR x10    Pt demonstrates increased R shoulder fatigue at end of treatment session.     PT Education - 03/12/18 1649    Education Details  Patient educated on proper form and technique for therapeutic exercises.    Person(s) Educated  Patient    Methods  Explanation;Demonstration    Comprehension  Verbalized understanding;Returned demonstration          PT Long Term Goals - 03/12/18 1650      PT LONG TERM GOAL #1   Title  Pt will be independent and compliant with HEP in order to maintain gains made in PT and return to prior level of funciton.    Baseline  Pt dependent on proper form and technique for HEP exercises 03/12/18: min cueing required for proper form    Time  4    Period  Weeks    Status  On-going      PT LONG TERM GOAL #2   Title  Pt will report worst pain in past week less than 2/10 in order to return to prior level of function    Baseline  4/10; 03/12/18: 3/10     Time  6    Period  Weeks  Status  On-going      PT LONG TERM GOAL #3   Title  Pt will demonstrate full, pain free, shoulder ROM in order to reach into cabinets, perform houshold activities and return to prior level of function.    Baseline  flex 100 deg, ABD 75 deg, ER 25, IR 15; 03/12/18: flexion 125, ABD 90 ER/IR deffered to next visit     Time  6    Period  Weeks    Status  On-going      PT LONG TERM GOAL #4   Title  Pt will improve FOTO score from 49 to 62 in order to demonstrate an improvement in function.    Baseline  current FOTO: 49; 03/12/18: deferred to next visit    Time  6    Period  Weeks    Status  On-going            Plan - 03/12/18 1655    Clinical Impression Statement  Pt is making progress towards all short and long term goals. Due to recent aggravation of shoulder pain when having to catch dog, some testing  was deferred to next session. Pt overall feels that she is making progress. Pt is able t ocomplete shoulder retraction, cervical retraciton and other HEP exercises with decreased difficulty and improved form. Pt's active flexion and abduction AROM has improved by 25 degrees in both motions (125 and 75, respectively). Pt continues to present with increase pain, increased tenderness along R shoulder musculature and decreased AROM R compared to L. Pt will continue to benefit from skilled PT in order to return to prior level of funciton.    Rehab Potential  Good    Clinical Impairments Affecting Rehab Potential  +motivated    PT Frequency  2x / week    PT Duration  6 weeks    PT Treatment/Interventions  ADLs/Self Care Home Management;Aquatic Therapy;Cryotherapy;Electrical Stimulation;Moist Heat;Ultrasound;Therapeutic activities;Therapeutic exercise;Patient/family education;Manual techniques;Passive range of motion;Dry needling    PT Next Visit Plan  STM to subscap, shoulder mobilizations, thoracic mobility exercises    PT Home Exercise Plan  see education section    Consulted and Agree with Plan of Care  Patient       Patient will benefit from skilled therapeutic intervention in order to improve the following deficits and impairments:  Increased muscle spasms, Decreased range of motion, Impaired UE functional use, Pain, Decreased strength  Visit Diagnosis: Chronic right shoulder pain  Stiffness of right shoulder, not elsewhere classified     Problem List Patient Active Problem List   Diagnosis Date Noted  . Acute pain of right shoulder 01/06/2018  . Cervical stenosis (uterine cervix) 09/09/2017  . Elevated BP without diagnosis of hypertension 06/06/2017  . Monocular diplopia of right eye 10/22/2016  . Bilateral lower extremity edema 01/30/2016  . Obesity 12/27/2015  . Frequent headaches 10/27/2015  . Environmental allergies 10/27/2015  . DVT (deep venous thrombosis) ([place]) 10/27/2015  .  Depression with anxiety 10/23/2015  . GERD (gastroesophageal reflux disease) 10/23/2015  . Vitamin D deficiency 10/23/2015    Georg Ruddle, SPT 03/12/2018, 5:00 PM   [place] [facility] [address], [place], [address] Phone: [phone]   Fax:  [phone]  Name: Saidy Ormand MRN: [id number] Date of Birth: [birth year]

## 2018-03-13 ENCOUNTER — Telehealth: Payer: Self-pay | Admitting: Family Medicine

## 2018-03-13 NOTE — Telephone Encounter (Signed)
Please let the patient know that I received a message from her physical therapist.  They noted that they would be in favor of additional imaging to evaluate her shoulder pain.  I would favor having her see orthopedics to determine whether or not she needs an MRI.  I can place a referral if she is willing.  Thanks.

## 2018-03-13 NOTE — Telephone Encounter (Signed)
-----   Message from Etta Grandchild, PT sent at 03/13/2018 12:33 PM EDT ----- Pt reassessed. Demonstrated mild-moderate improvements in ROM. Pain slightly improved, but function still somewhat limited. PT in favor of additional diagnostic imaging to r/o adhesive capsulitis or other potential contributing pathology. Thanks for the referral.   12:33 PM, 03/13/18 Etta Grandchild, PT, DPT Physical Therapist - [place] [phone] (Office)

## 2018-03-16 ENCOUNTER — Encounter: Payer: Self-pay | Admitting: Family Medicine

## 2018-03-16 DIAGNOSIS — M25511 Pain in right shoulder: Secondary | ICD-10-CM

## 2018-03-16 NOTE — Telephone Encounter (Signed)
Left message to return call, ok for pec to speak to patient about message below 

## 2018-03-17 ENCOUNTER — Ambulatory Visit: Payer: PPO

## 2018-03-17 DIAGNOSIS — M25611 Stiffness of right shoulder, not elsewhere classified: Secondary | ICD-10-CM

## 2018-03-17 DIAGNOSIS — G8929 Other chronic pain: Secondary | ICD-10-CM

## 2018-03-17 DIAGNOSIS — M25511 Pain in right shoulder: Principal | ICD-10-CM

## 2018-03-18 NOTE — Therapy (Signed)
[facility] [address] [place], [address] Phone: [phone]   Fax:  [phone]  Physical Therapy Treatment  Patient Details  Name: Michele Long MRN: [id number] Date of Birth: Dec 06, 1950 No data recorded  Encounter Date: 03/17/2018  PT End of Session - 03/18/18 1327    Visit Number  12    Number of Visits  25    Date for PT Re-Evaluation  04/29/18    PT Start Time  1430    PT Stop Time  1515    PT Time Calculation (min)  45 min    Activity Tolerance  Patient tolerated treatment well    Behavior During Therapy  [facility] for tasks assessed/performed       Past Medical History:  Diagnosis Date  . Allergy   . Depression   . DVT (deep venous thrombosis) ([place])   . Edema   . Frequent headaches   . Gastric ulcer 10/27/2015  . GERD (gastroesophageal reflux disease)   . HTN (hypertension)   . Microalbuminuria   . Migraine   . Shingles   . Symptomatic menopausal or female climacteric states   . UTI (lower urinary tract infection)     Past Surgical History:  Procedure Laterality Date  . AUGMENTATION MAMMAPLASTY Bilateral 1983  . BREAST ENHANCEMENT SURGERY  1983  . NASAL SEPTUM SURGERY  2000    There were no vitals filed for this visit.  Subjective Assessment - 03/18/18 1324    Subjective  Pt states that she has reached out to MD about potential follow up in regards to shoulder pain. Pt states that her shoulder is sore and does not feel any different compared to last session. No other significant updates since previous visit.    Limitations  House hold activities;Lifting    Patient Stated Goals  get back to everyday tasks, be able to do yardwork and reach up in cabinents    Currently in Pain?  Yes    Pain Score  3     Pain Location  Shoulder    Pain Orientation  Right    Pain Descriptors / Indicators  Aching    Pain Type  Chronic pain    Pain Onset  More than a month ago    Pain Frequency  Constant         TREATMENT Manual Therapy Grade II/III Inferior and posterior glenohumeral mobilizations to improve joint mobility, decrease pain, and improve AROM/PROM, patient in supine with roll under knees x5 min total  Scapular mobilizations (superior, inferior, upward rotation, downward rotation) to increase mobility of scapula throughout shoulder AROM and decrease muscle spasms and pain x5 min total  STM to L subscpularis muscle with patient in sidelying to decrease pain and muscle spasms x10 min  Therapeutic Exercises PROM shoulder flexion, ER, IR and abd in order to decrease pain and increase ROM x5 min Scapular retractions x10  ER/IR with YTB x10 "W" exercise with YTB x10 Resisted scapular retractions with YTB 2x10   Modalities  Estimwith heat to R shoulder to decrease muscle spasms and pain-- ptsittingwith4 electrodes(post delt, teres minor, suprascap andcoracoid process)high volt x10 min. Checked skin with no adverse effects.   Pt reports decreased shoulder pain after electrical stimulation and heat. Pt fatigued at end of treatment session.     PT Education - 03/18/18 1326    Education Details  Patient educated on proper form and technique for therapeutic exercises.    Person(s) Educated  Patient    Methods  Explanation;Demonstration    Comprehension  Verbalized understanding;Returned demonstration          PT Long Term Goals - 03/12/18 1650      PT LONG TERM GOAL #1   Title  Pt will be independent and compliant with HEP in order to maintain gains made in PT and return to prior level of funciton.    Baseline  Pt dependent on proper form and technique for HEP exercises 03/12/18: min cueing required for proper form    Time  4    Period  Weeks    Status  On-going      PT LONG TERM GOAL #2   Title  Pt will report worst pain in past week less than 2/10 in order to return to prior level of function    Baseline  4/10; 03/12/18: 3/10     Time  6    Period  Weeks     Status  On-going      PT LONG TERM GOAL #3   Title  Pt will demonstrate full, pain free, shoulder ROM in order to reach into cabinets, perform houshold activities and return to prior level of function.    Baseline  flex 100 deg, ABD 75 deg, ER 25, IR 15; 03/12/18: flexion 125, ABD 90 ER/IR deffered to next visit     Time  6    Period  Weeks    Status  On-going      PT LONG TERM GOAL #4   Title  Pt will improve FOTO score from 49 to 62 in order to demonstrate an improvement in function.    Baseline  current FOTO: 49; 03/12/18: deferred to next visit    Time  6    Period  Weeks    Status  On-going            Plan - 03/18/18 1331    Clinical Impression Statement  Pt demonstrates decreased tenderness to palpation along R subscapularis musculature. Pt continues to present with decreased AROM and PROM of R shoulder and increased pain. Pt requires verbal and tactile cueing to increase scapular retraction with 'W' exercises. Pt will continue to benefit from skilled PT in order to return to prior level of function.    Rehab Potential  Good    Clinical Impairments Affecting Rehab Potential  +motivated    PT Frequency  2x / week    PT Duration  6 weeks    PT Treatment/Interventions  ADLs/Self Care Home Management;Aquatic Therapy;Cryotherapy;Electrical Stimulation;Moist Heat;Ultrasound;Therapeutic activities;Therapeutic exercise;Patient/family education;Manual techniques;Passive range of motion;Dry needling    PT Next Visit Plan  STM to subscap, shoulder mobilizations, thoracic mobility exercises    PT Home Exercise Plan  see education section    Consulted and Agree with Plan of Care  Patient       Patient will benefit from skilled therapeutic intervention in order to improve the following deficits and impairments:  Increased muscle spasms, Decreased range of motion, Impaired UE functional use, Pain, Decreased strength  Visit Diagnosis: Chronic right shoulder pain  Stiffness of right  shoulder, not elsewhere classified     Problem List Patient Active Problem List   Diagnosis Date Noted  . Acute pain of right shoulder 01/06/2018  . Cervical stenosis (uterine cervix) 09/09/2017  . Elevated BP without diagnosis of hypertension 06/06/2017  . Monocular diplopia of right eye 10/22/2016  . Bilateral lower extremity edema 01/30/2016  . Obesity 12/27/2015  . Frequent headaches  10/27/2015  . Environmental allergies 10/27/2015  . DVT (deep venous thrombosis) ([place]) 10/27/2015  . Depression with anxiety 10/23/2015  . GERD (gastroesophageal reflux disease) 10/23/2015  . Vitamin D deficiency 10/23/2015    Georg Ruddle, SPT 03/18/2018, 1:36 PM  [facility] [address], [place], [address] Phone: [phone]   Fax:  [phone]  Name: Michele Long MRN: [id number] Date of Birth: 1950/09/17

## 2018-03-19 ENCOUNTER — Ambulatory Visit: Payer: PPO

## 2018-03-19 DIAGNOSIS — G8929 Other chronic pain: Secondary | ICD-10-CM

## 2018-03-19 DIAGNOSIS — M25511 Pain in right shoulder: Secondary | ICD-10-CM | POA: Diagnosis not present

## 2018-03-19 DIAGNOSIS — M25611 Stiffness of right shoulder, not elsewhere classified: Secondary | ICD-10-CM

## 2018-03-19 NOTE — Addendum Note (Signed)
Addended by: Blain Pais on: 03/19/2018 09:50 AM   Modules accepted: Orders

## 2018-03-19 NOTE — Telephone Encounter (Signed)
Left message to return call, ok for pec to speak to patient about message below 

## 2018-03-19 NOTE — Therapy (Signed)
[facility] [address] [place], [address] Phone: [phone]   Fax:  [phone]  Physical Therapy Treatment  Patient Details  Name: Michele Long MRN: [id number] Date of Birth: [birth year] No data recorded  Encounter Date: 03/19/2018  PT End of Session - 03/19/18 1203    Visit Number  13    Number of Visits  25    Date for PT Re-Evaluation  04/29/18    PT Start Time  1115    PT Stop Time  1200    PT Time Calculation (min)  45 min    Activity Tolerance  Patient tolerated treatment well    Behavior During Therapy  [facility] for tasks assessed/performed       Past Medical History:  Diagnosis Date  . Allergy   . Depression   . DVT (deep venous thrombosis) ([place])   . Edema   . Frequent headaches   . Gastric ulcer 10/27/2015  . GERD (gastroesophageal reflux disease)   . HTN (hypertension)   . Microalbuminuria   . Migraine   . Shingles   . Symptomatic menopausal or female climacteric states   . UTI (lower urinary tract infection)     Past Surgical History:  Procedure Laterality Date  . AUGMENTATION MAMMAPLASTY Bilateral 1983  . BREAST ENHANCEMENT SURGERY  1983  . NASAL SEPTUM SURGERY  2000    There were no vitals filed for this visit.  Subjective Assessment - 03/19/18 1200    Subjective  Pt states that she painted a signifcant amount yesterday but that she did not have an increase in shoulder pain. Pt also states that she heard from MD and will be seeing a sports medicine MD in the near future.    Limitations  House hold activities;Lifting    Patient Stated Goals  get back to everyday tasks, be able to do yardwork and reach up in cabinents    Currently in Pain?  Yes    Pain Score  1     Pain Location  Shoulder    Pain Orientation  Right    Pain Descriptors / Indicators  Aching    Pain Type  Chronic pain    Pain Onset  More than a month ago          TREATMENT Manual Therapy Grade II/III  Inferior and posterior glenohumeral mobilizations to improve joint mobility, decrease pain, and improve AROM/PROM, patient in supine with roll under knees x5 min total  Scapular mobilizations (superior, inferior, upward rotation, downward rotation) to increase mobility of scapula throughout shoulder AROM and decrease muscle spasms and pain x5 min total  STM to L subscpularis muscle with patient in sidelying to decrease pain and muscle spasms x20min  Therapeutic Exercises PROM shoulder flexion, ER, IR and abd in order to decrease pain and increase ROM x2 min Isometrics against wall IR/ER/flex/ext x10  ER/IR with RTB x10 "W" exercise with YTB x10 Thoracic extension over chair x20  Self STM to posterior RTC musculature with baseball against wall x3 min (added to HEP)     Modalities  Estimwith heat to R shoulder to decrease muscle spasms and pain-- ptsittingwith4 electrodes(post delt, teres minor, suprascap andcoracoid process)high volt x10 min. Checked skin with no adverse effects.   Pt reports decreased shoulder pain afterelectrical stimulation and heat.Pt fatigued at end of treatment session.        PT Education - 03/19/18 1202    Education Details  Patient educated on  proper form and technique for therapeutic exercises.    Person(s) Educated  Patient    Methods  Explanation;Demonstration    Comprehension  Verbalized understanding;Returned demonstration          PT Long Term Goals - 03/12/18 1650      PT LONG TERM GOAL #1   Title  Pt will be independent and compliant with HEP in order to maintain gains made in PT and return to prior level of funciton.    Baseline  Pt dependent on proper form and technique for HEP exercises 03/12/18: min cueing required for proper form    Time  4    Period  Weeks    Status  On-going      PT LONG TERM GOAL #2   Title  Pt will report worst pain in past week less than 2/10 in order to return to prior level of function     Baseline  4/10; 03/12/18: 3/10     Time  6    Period  Weeks    Status  On-going      PT LONG TERM GOAL #3   Title  Pt will demonstrate full, pain free, shoulder ROM in order to reach into cabinets, perform houshold activities and return to prior level of function.    Baseline  flex 100 deg, ABD 75 deg, ER 25, IR 15; 03/12/18: flexion 125, ABD 90 ER/IR deffered to next visit     Time  6    Period  Weeks    Status  On-going      PT LONG TERM GOAL #4   Title  Pt will improve FOTO score from 49 to 62 in order to demonstrate an improvement in function.    Baseline  current FOTO: 49; 03/12/18: deferred to next visit    Time  6    Period  Weeks    Status  On-going            Plan - 03/19/18 1433    Clinical Impression Statement  Pt demonstrates decreased pain with active shoulder flexion and abduction. Pt also presents with decreased tenderness along subscapularis and increased tenderness along supraspinatus and teres minor. Pt requires decreased verbal cueing for proper technique for "w" exercise. Pt wiill continue to benefit from skilled PT in order to return to prior level of function.    Rehab Potential  Good    Clinical Impairments Affecting Rehab Potential  +motivated    PT Frequency  2x / week    PT Duration  6 weeks    PT Treatment/Interventions  ADLs/Self Care Home Management;Aquatic Therapy;Cryotherapy;Electrical Stimulation;Moist Heat;Ultrasound;Therapeutic activities;Therapeutic exercise;Patient/family education;Manual techniques;Passive range of motion;Dry needling    PT Next Visit Plan  STM to posterior RTC musculature, thoracic mobility     PT Home Exercise Plan  see education section    Consulted and Agree with Plan of Care  Patient       Patient will benefit from skilled therapeutic intervention in order to improve the following deficits and impairments:  Increased muscle spasms, Decreased range of motion, Impaired UE functional use, Pain, Decreased strength  Visit  Diagnosis: Chronic right shoulder pain  Stiffness of right shoulder, not elsewhere classified     Problem List Patient Active Problem List   Diagnosis Date Noted  . Acute pain of right shoulder 01/06/2018  . Cervical stenosis (uterine cervix) 09/09/2017  . Elevated BP without diagnosis of hypertension 06/06/2017  . Monocular diplopia of right eye 10/22/2016  . Bilateral  lower extremity edema 01/30/2016  . Obesity 12/27/2015  . Frequent headaches 10/27/2015  . Environmental allergies 10/27/2015  . DVT (deep venous thrombosis) ([place]) 10/27/2015  . Depression with anxiety 10/23/2015  . GERD (gastroesophageal reflux disease) 10/23/2015  . Vitamin D deficiency 10/23/2015    Georg Ruddle, SPT 03/19/2018, 2:35 PM  [facility] [address] [place], [address] Phone: [phone]   Fax:  [phone]  Name: Michele Long MRN: [id number] Date of Birth: [birth year]

## 2018-03-23 ENCOUNTER — Encounter: Payer: Self-pay | Admitting: Sports Medicine

## 2018-03-23 ENCOUNTER — Ambulatory Visit (INDEPENDENT_AMBULATORY_CARE_PROVIDER_SITE_OTHER): Payer: PPO | Admitting: Sports Medicine

## 2018-03-23 VITALS — BP 132/92 | Ht 66.0 in | Wt 220.0 lb

## 2018-03-23 DIAGNOSIS — M25511 Pain in right shoulder: Secondary | ICD-10-CM

## 2018-03-23 MED ORDER — METHYLPREDNISOLONE ACETATE 40 MG/ML IJ SUSP
40.0000 mg | Freq: Once | INTRAMUSCULAR | Status: AC
  Administered 2018-03-23: 40 mg via INTRA_ARTICULAR

## 2018-03-23 NOTE — Progress Notes (Signed)
   Subjective:    Patient ID: Michele Long, female    DOB: [birth year], 67 y.o.   MRN: [id number]  HPI chief complaint: Right shoulder pain  Very pleasant right-hand-dominant 67 year old female comes in today complaining of 5 months of lateral right shoulder pain. She denies any trauma but rather describes a gradual onset of pain that has become more constant. 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 Over the past several weeks her pain has gotten to the point to where it is more constant. It is worse with reaching directly overhead or away from her body. She denies any numbness or tingling. She has a history of adhesive capsulitis in the left shoulder but her right shoulder pain feels different. She does have some mild nighttime pain. She takes 600 mg of ibuprofen as needed which does seem to take the edge off. She is currently in physical therapy and does find it to be helpful. She is referred to [place] today by her primary care physician for further workup and treatment. She denies any previous shoulder surgery. She is here today with her daughter.  Past medical history reviewed Medications reviewed Allergies reviewed    Review of Systems  as above    Objective:   Physical Exam  Well-developed, well-nourished. No acute distress. Awake alert and oriented 3. Vital signs reviewed  Right shoulder: Patient has limited active range of motion, especially with forward flexion, abduction, and internal rotation.She does have better passive range of motion with completely normal passive external rotation. There is no tenderness to palpation. Positive painful arc. Positive empty can, positive [place]. Rotator cuff strength is 5/5 bilaterally but reproducible of pain with resisted supraspinatus and external rotation on the right. She is neurovascular intact distally.      Assessment & Plan:   Right shoulder pain  secondary to rotator cuff tendinopathy versus rotator cuff tear  Subacromial cortisone injection was administered today. Patient tolerates this without difficulty. She will continue with physical therapy and follow-up with me in 3 weeks for reevaluation.If symptoms persist, consider MRI for possible presurgical planning. Call with questions or concerns in the interim.  Consent obtained and verified. Time-out conducted. Noted no overlying erythema, induration, or other signs of local infection. Skin prepped in a sterile fashion. Topical analgesic spray: Ethyl chloride. Joint: right shoulder Needle: 25g 1.5 inch Completed without difficulty. Meds: 3cc 1% xylocaine, 1cc (40mg ) depomedrol  Advised to call if fevers/chills, erythema, induration, drainage, or persistent bleeding.

## 2018-03-24 ENCOUNTER — Ambulatory Visit: Payer: PPO

## 2018-03-24 ENCOUNTER — Ambulatory Visit: Payer: Self-pay | Admitting: Sports Medicine

## 2018-03-25 ENCOUNTER — Ambulatory Visit: Payer: PPO

## 2018-03-25 DIAGNOSIS — M25511 Pain in right shoulder: Secondary | ICD-10-CM

## 2018-03-25 DIAGNOSIS — G8929 Other chronic pain: Secondary | ICD-10-CM

## 2018-03-25 DIAGNOSIS — M25611 Stiffness of right shoulder, not elsewhere classified: Secondary | ICD-10-CM

## 2018-03-25 NOTE — Therapy (Signed)
[name] [address], [place], [address] Phone: [phone]   Fax:  [phone]  Physical Therapy Treatment  Patient Details  Name: Michele Long MRN: [id number] Date of Birth: [birth year] No data recorded  Encounter Date: 03/25/2018  PT End of Session - 03/25/18 1514    Visit Number  14    Number of Visits  25    Date for PT Re-Evaluation  04/29/18    PT Start Time  1430    PT Stop Time  1515    PT Time Calculation (min)  45 min    Activity Tolerance  Patient tolerated treatment well    Behavior During Therapy  [facility] for tasks assessed/performed       Past Medical History:  Diagnosis Date  . Allergy   . Depression   . DVT (deep venous thrombosis) ([place])   . Edema   . Frequent headaches   . Gastric ulcer 10/27/2015  . GERD (gastroesophageal reflux disease)   . HTN (hypertension)   . Microalbuminuria   . Migraine   . Shingles   . Symptomatic menopausal or female climacteric states   . UTI (lower urinary tract infection)     Past Surgical History:  Procedure Laterality Date  . AUGMENTATION MAMMAPLASTY Bilateral 1983  . BREAST ENHANCEMENT SURGERY  1983  . NASAL SEPTUM SURGERY  2000    There were no vitals filed for this visit.  Subjective Assessment - 03/25/18 1501    Subjective  Patient states she has been painting which has been challenging with her shoulder pain.     Limitations  House hold activities;Lifting    Patient Stated Goals  get back to everyday tasks, be able to do yardwork and reach up in cabinents    Currently in Pain?  Yes    Pain Score  1     Pain Location  Shoulder    Pain Orientation  Right    Pain Descriptors / Indicators  Aching    Pain Type  Chronic pain    Pain Onset  More than a month ago    Pain Frequency  Constant       Manual Therapy  Grade III Inferior, distraction and posterior glenohumeral mobilizations to improve joint mobility, decrease pain, and  improve AROM/PROM, patient in supine with roll under knees x7 min total   Scapular mobilizations (superior, inferior, upward rotation, downward rotation) to increase mobility of scapula throughout shoulder AROM and decrease muscle spasms and pain x5 min total     Therapeutic Exercises "W" exercise with YTB x20 Thoracic extension over chair x20 with feet on a stool  Standing scapular rows in standing - x 20 10# Seated High row at [place] - x20 15# Standing UE ranger flexion - x 30;abduction - x 10 Serratus punches with YTB - x 20 in standing Push up PLUS in standing - x 20  Straight arm arm circles straight - x 45 sec straight 2#  Patient demonstrates increased fatigue at the end of the session   PT Education - 03/25/18 1513    Education Details  form/technique with exercise    Person(s) Educated  Patient    Methods  Explanation;Demonstration    Comprehension  Returned demonstration;Verbalized understanding          PT Long Term Goals - 03/12/18 1650      PT LONG TERM GOAL #1   Title  Pt will be independent and compliant with HEP  in order to maintain gains made in PT and return to prior level of funciton.    Baseline  Pt dependent on proper form and technique for HEP exercises 03/12/18: min cueing required for proper form    Time  4    Period  Weeks    Status  On-going      PT LONG TERM GOAL #2   Title  Pt will report worst pain in past week less than 2/10 in order to return to prior level of function    Baseline  4/10; 03/12/18: 3/10     Time  6    Period  Weeks    Status  On-going      PT LONG TERM GOAL #3   Title  Pt will demonstrate full, pain free, shoulder ROM in order to reach into cabinets, perform houshold activities and return to prior level of function.    Baseline  flex 100 deg, ABD 75 deg, ER 25, IR 15; 03/12/18: flexion 125, ABD 90 ER/IR deffered to next visit     Time  6    Period  Weeks    Status  On-going      PT LONG TERM GOAL #4   Title  Pt will  improve FOTO score from 49 to 62 in order to demonstrate an improvement in function.    Baseline  current FOTO: 49; 03/12/18: deferred to next visit    Time  6    Period  Weeks    Status  On-going            Plan - 03/25/18 1514    Clinical Impression Statement  Patient demonstrates improvement in shoulder flexion AROM after performing scapular and GH mobilization indicating improvement with tissue elasticity and improvement with motor control. Patient continues to have restricted AROM with overhead and abduction motions but is overall having less pain compared to previous therapy session. Patient will benefit from further skilled therapy to return toprior level of function.     Rehab Potential  Good    Clinical Impairments Affecting Rehab Potential  +motivated    PT Frequency  2x / week    PT Duration  6 weeks    PT Treatment/Interventions  ADLs/Self Care Home Management;Aquatic Therapy;Cryotherapy;Electrical Stimulation;Moist Heat;Ultrasound;Therapeutic activities;Therapeutic exercise;Patient/family education;Manual techniques;Passive range of motion;Dry needling    PT Next Visit Plan  STM to posterior RTC musculature, thoracic mobility     PT Home Exercise Plan  see education section    Consulted and Agree with Plan of Care  Patient       Patient will benefit from skilled therapeutic intervention in order to improve the following deficits and impairments:  Increased muscle spasms, Decreased range of motion, Impaired UE functional use, Pain, Decreased strength  Visit Diagnosis: Stiffness of right shoulder, not elsewhere classified  Chronic right shoulder pain     Problem List Patient Active Problem List   Diagnosis Date Noted  . Acute pain of right shoulder 01/06/2018  . Cervical stenosis (uterine cervix) 09/09/2017  . Elevated BP without diagnosis of hypertension 06/06/2017  . Monocular diplopia of right eye 10/22/2016  . Bilateral lower extremity edema 01/30/2016  .  Obesity 12/27/2015  . Frequent headaches 10/27/2015  . Environmental allergies 10/27/2015  . DVT (deep venous thrombosis) ([place]) 10/27/2015  . Depression with anxiety 10/23/2015  . GERD (gastroesophageal reflux disease) 10/23/2015  . Vitamin D deficiency 10/23/2015    Michele Long, PT DPT 03/25/2018, 3:17 PM  [facility] [address] [place], [address] Phone: [phone]   Fax:  [phone]  Name: Bronnie Vasseur MRN: [id number] Date of Birth: May 07, 1951

## 2018-03-27 DIAGNOSIS — H903 Sensorineural hearing loss, bilateral: Secondary | ICD-10-CM | POA: Diagnosis not present

## 2018-04-01 NOTE — Telephone Encounter (Signed)
See my chart message dated 03/17/18, patient seeing sport Medicine.

## 2018-04-02 ENCOUNTER — Ambulatory Visit: Payer: PPO | Attending: Family Medicine

## 2018-04-02 DIAGNOSIS — M25511 Pain in right shoulder: Secondary | ICD-10-CM | POA: Insufficient documentation

## 2018-04-02 DIAGNOSIS — G8929 Other chronic pain: Secondary | ICD-10-CM | POA: Diagnosis not present

## 2018-04-02 DIAGNOSIS — M25611 Stiffness of right shoulder, not elsewhere classified: Secondary | ICD-10-CM | POA: Diagnosis not present

## 2018-04-02 NOTE — Therapy (Signed)
[facility] [address], [place], [address] Phone: [phone]   Fax:  [phone]  Physical Therapy Treatment  Patient Details  Name: Michele Long MRN: [id number] Date of Birth: [birth year] No data recorded  Encounter Date: 04/02/2018  PT End of Session - 04/02/18 1436    Visit Number  15    Number of Visits  25    Date for PT Re-Evaluation  04/29/18    PT Start Time  1115    PT Stop Time  1200    PT Time Calculation (min)  45 min    Activity Tolerance  Patient tolerated treatment well    Behavior During Therapy  [facility] for tasks assessed/performed       Past Medical History:  Diagnosis Date  . Allergy   . Depression   . DVT (deep venous thrombosis) ([place])   . Edema   . Frequent headaches   . Gastric ulcer 10/27/2015  . GERD (gastroesophageal reflux disease)   . HTN (hypertension)   . Microalbuminuria   . Migraine   . Shingles   . Symptomatic menopausal or female climacteric states   . UTI (lower urinary tract infection)     Past Surgical History:  Procedure Laterality Date  . AUGMENTATION MAMMAPLASTY Bilateral 1983  . BREAST ENHANCEMENT SURGERY  1983  . NASAL SEPTUM SURGERY  2000    There were no vitals filed for this visit.  Subjective Assessment - 04/02/18 1434    Subjective  Patient reports increased pain  but is able to raise her arm higher over her head. Patient states she has been performing HEP.    Limitations  House hold activities;Lifting    Patient Stated Goals  get back to everyday tasks, be able to do yardwork and reach up in cabinents    Currently in Pain?  Yes    Pain Score  2     Pain Location  Shoulder    Pain Orientation  Right    Pain Descriptors / Indicators  Aching    Pain Type  Chronic pain    Pain Onset  More than a month ago    Pain Frequency  Constant         Manual Therapy  Grade III Inferior, distraction and posterior glenohumeral mobilizations to improve  joint mobility, decrease pain, and improve AROM/PROM, patient in supine with roll under knees x7 min total   Scapular mobilizations (superior, inferior, upward rotation, downward rotation) to increase mobility of scapula throughout shoulder AROM and decrease muscle spasms and pain x7 min total     Therapeutic Exercises Standing UE ranger flexion - x 30; x 20 2# Seated thoracic rotation - x5 with 5 sec holds against manual resistance Standing shoulder ER/IR - x 25 2# ; x20 3# IR/ER in standing with large body blade - x 20 Overhead bodyblade B UE forward/backward - x 20   Patient demonstrates increased fatigue at the end of the session     PT Education - 04/02/18 1435    Education Details  form/technique with exercise    Person(s) Educated  Patient    Methods  Explanation;Demonstration    Comprehension  Verbalized understanding;Returned demonstration          PT Long Term Goals - 03/12/18 1650      PT LONG TERM GOAL #1   Title  Pt will be independent and compliant with HEP in order to maintain gains made in PT and  return to prior level of funciton.    Baseline  Pt dependent on proper form and technique for HEP exercises 03/12/18: min cueing required for proper form    Time  4    Period  Weeks    Status  On-going      PT LONG TERM GOAL #2   Title  Pt will report worst pain in past week less than 2/10 in order to return to prior level of function    Baseline  4/10; 03/12/18: 3/10     Time  6    Period  Weeks    Status  On-going      PT LONG TERM GOAL #3   Title  Pt will demonstrate full, pain free, shoulder ROM in order to reach into cabinets, perform houshold activities and return to prior level of function.    Baseline  flex 100 deg, ABD 75 deg, ER 25, IR 15; 03/12/18: flexion 125, ABD 90 ER/IR deffered to next visit     Time  6    Period  Weeks    Status  On-going      PT LONG TERM GOAL #4   Title  Pt will improve FOTO score from 49 to 62 in order to demonstrate an  improvement in function.    Baseline  current FOTO: 49; 03/12/18: deferred to next visit    Time  6    Period  Weeks    Status  On-going            Plan - 04/02/18 1443    Clinical Impression Statement  Patient demonstrates improvement in shoulder AROM after performing manual therapy indicating improvement in tissue elasticity. Patient demonstrates increased fatigue with overheard motions most notably with resistive based exercies. Patient demonstrates overall improvement and will benefit from further skilled therapy to return to prior level of function.      Rehab Potential  Good    Clinical Impairments Affecting Rehab Potential  +motivated    PT Frequency  2x / week    PT Duration  6 weeks    PT Treatment/Interventions  ADLs/Self Care Home Management;Aquatic Therapy;Cryotherapy;Electrical Stimulation;Moist Heat;Ultrasound;Therapeutic activities;Therapeutic exercise;Patient/family education;Manual techniques;Passive range of motion;Dry needling    PT Next Visit Plan  STM to posterior RTC musculature, thoracic mobility     PT Home Exercise Plan  see education section    Consulted and Agree with Plan of Care  Patient       Patient will benefit from skilled therapeutic intervention in order to improve the following deficits and impairments:  Increased muscle spasms, Decreased range of motion, Impaired UE functional use, Pain, Decreased strength  Visit Diagnosis: Stiffness of right shoulder, not elsewhere classified  Chronic right shoulder pain     Problem List Patient Active Problem List   Diagnosis Date Noted  . Acute pain of right shoulder 01/06/2018  . Cervical stenosis (uterine cervix) 09/09/2017  . Elevated BP without diagnosis of hypertension 06/06/2017  . Monocular diplopia of right eye 10/22/2016  . Bilateral lower extremity edema 01/30/2016  . Obesity 12/27/2015  . Frequent headaches 10/27/2015  . Environmental allergies 10/27/2015  . DVT (deep venous thrombosis)  ([place]) 10/27/2015  . Depression with anxiety 10/23/2015  . GERD (gastroesophageal reflux disease) 10/23/2015  . Vitamin D deficiency 10/23/2015    Blythe Stanford, PT DPT 04/02/2018, 3:19 PM  [place] [address] [place], [address] Phone: [phone]   Fax:  [phone]  Name: Michele Long MRN: [id number] Date of Birth: 05-Jun-1951

## 2018-04-03 DIAGNOSIS — H903 Sensorineural hearing loss, bilateral: Secondary | ICD-10-CM | POA: Diagnosis not present

## 2018-04-06 ENCOUNTER — Ambulatory Visit: Payer: PPO

## 2018-04-06 DIAGNOSIS — M25611 Stiffness of right shoulder, not elsewhere classified: Secondary | ICD-10-CM | POA: Diagnosis not present

## 2018-04-06 DIAGNOSIS — M25511 Pain in right shoulder: Secondary | ICD-10-CM

## 2018-04-06 DIAGNOSIS — G8929 Other chronic pain: Secondary | ICD-10-CM

## 2018-04-06 NOTE — Therapy (Signed)
[place] [facility] [address], [place], [address] Phone: [phone]   Fax:  [phone]  Physical Therapy Treatment  Patient Details  Name: Michele Long MRN: [id number] Date of Birth: [birth year] No data recorded  Encounter Date: 04/06/2018  PT End of Session - 04/06/18 1510    Visit Number  16    Number of Visits  25    Date for PT Re-Evaluation  04/29/18    PT Start Time  1430    PT Stop Time  1515    PT Time Calculation (min)  45 min    Activity Tolerance  Patient tolerated treatment well    Behavior During Therapy  [facility] for tasks assessed/performed       Past Medical History:  Diagnosis Date  . Allergy   . Depression   . DVT (deep venous thrombosis) ([place])   . Edema   . Frequent headaches   . Gastric ulcer 10/27/2015  . GERD (gastroesophageal reflux disease)   . HTN (hypertension)   . Microalbuminuria   . Migraine   . Shingles   . Symptomatic menopausal or female climacteric states   . UTI (lower urinary tract infection)     Past Surgical History:  Procedure Laterality Date  . AUGMENTATION MAMMAPLASTY Bilateral 1983  . BREAST ENHANCEMENT SURGERY  1983  . NASAL SEPTUM SURGERY  2000    There were no vitals filed for this visit.  Subjective Assessment - 04/06/18 1459    Subjective  Patient reports no major changes but states her shoulder is continuing to improve. Patient states she has been performing her HEP.     Limitations  House hold activities;Lifting    Patient Stated Goals  get back to everyday tasks, be able to do yardwork and reach up in cabinents    Currently in Pain?  Yes    Pain Score  2     Pain Location  Shoulder    Pain Orientation  Right    Pain Descriptors / Indicators  Aching    Pain Type  Chronic pain    Pain Onset  More than a month ago    Pain Frequency  Constant       Manual Therapy  Grade III Inferior, distraction and posterior glenohumeral mobilizations to improve  joint mobility, decrease pain, and improve AROM/PROM, patient in supine with roll under knees x7 min total   Scapular mobilizations (superior, inferior, upward rotation, downward rotation) to increase mobility of scapula throughout shoulder AROM and decrease muscle spasms and pain x5 min total     Therapeutic Exercises Seated High row at [employer] - x20 15# Standing scapular rows in standing - x 20 15# Straight arm arm circles straight - x 45 sec straight 2# (next visit 4#) Standing UE ranger flexion - x 30  Standing physioball flexion - x30 Overhead ball taps - x30 2.2#   Patient demonstrates increased fatigue at the end of the session  PT Education - 04/06/18 1504    Education Details  form/technique with exercise    Person(s) Educated  Patient    Methods  Explanation;Demonstration    Comprehension  Verbalized understanding;Returned demonstration          PT Long Term Goals - 03/12/18 1650      PT LONG TERM GOAL #1   Title  Pt will be independent and compliant with HEP in order to maintain gains made in PT and return to prior level of funciton.  Baseline  Pt dependent on proper form and technique for HEP exercises 03/12/18: min cueing required for proper form    Time  4    Period  Weeks    Status  On-going      PT LONG TERM GOAL #2   Title  Pt will report worst pain in past week less than 2/10 in order to return to prior level of function    Baseline  4/10; 03/12/18: 3/10     Time  6    Period  Weeks    Status  On-going      PT LONG TERM GOAL #3   Title  Pt will demonstrate full, pain free, shoulder ROM in order to reach into cabinets, perform houshold activities and return to prior level of function.    Baseline  flex 100 deg, ABD 75 deg, ER 25, IR 15; 03/12/18: flexion 125, ABD 90 ER/IR deffered to next visit     Time  6    Period  Weeks    Status  On-going      PT LONG TERM GOAL #4   Title  Pt will improve FOTO score from 49 to 62 in order to demonstrate an  improvement in function.    Baseline  current FOTO: 49; 03/12/18: deferred to next visit    Time  6    Period  Weeks    Status  On-going            Plan - 04/06/18 1511    Clinical Impression Statement  Patient continues to demosntrate improvement with painfree shoulder flexion today after performing manual therapy indicating functional improvement and improved tissue elasticity. Patient demosntrates ability to perform exercises with greater amount of resistance compared to previous sessions indicating funcitonal carryover. Patient will benefit from further skilled therapy to return to prior level of function.     Rehab Potential  Good    Clinical Impairments Affecting Rehab Potential  +motivated    PT Frequency  2x / week    PT Duration  6 weeks    PT Treatment/Interventions  ADLs/Self Care Home Management;Aquatic Therapy;Cryotherapy;Electrical Stimulation;Moist Heat;Ultrasound;Therapeutic activities;Therapeutic exercise;Patient/family education;Manual techniques;Passive range of motion;Dry needling    PT Next Visit Plan  STM to posterior RTC musculature, thoracic mobility     PT Home Exercise Plan  see education section    Consulted and Agree with Plan of Care  Patient       Patient will benefit from skilled therapeutic intervention in order to improve the following deficits and impairments:  Increased muscle spasms, Decreased range of motion, Impaired UE functional use, Pain, Decreased strength  Visit Diagnosis: Stiffness of right shoulder, not elsewhere classified  Chronic right shoulder pain     Problem List Patient Active Problem List   Diagnosis Date Noted  . Acute pain of right shoulder 01/06/2018  . Cervical stenosis (uterine cervix) 09/09/2017  . Elevated BP without diagnosis of hypertension 06/06/2017  . Monocular diplopia of right eye 10/22/2016  . Bilateral lower extremity edema 01/30/2016  . Obesity 12/27/2015  . Frequent headaches 10/27/2015  .  Environmental allergies 10/27/2015  . DVT (deep venous thrombosis) ([place]) 10/27/2015  . Depression with anxiety 10/23/2015  . GERD (gastroesophageal reflux disease) 10/23/2015  . Vitamin D deficiency 10/23/2015    Blythe Stanford, PT DPT 04/06/2018, 3:14 PM  [place] [facility] [address], [place], [address] Phone: [phone]   Fax:  [phone]  Name: Michele Long  MRN: [id number] Date of Birth: October 02, 1950

## 2018-04-07 ENCOUNTER — Other Ambulatory Visit: Payer: Self-pay | Admitting: Family Medicine

## 2018-04-08 ENCOUNTER — Ambulatory Visit: Payer: PPO

## 2018-04-08 DIAGNOSIS — M25511 Pain in right shoulder: Principal | ICD-10-CM

## 2018-04-08 DIAGNOSIS — G8929 Other chronic pain: Secondary | ICD-10-CM

## 2018-04-08 DIAGNOSIS — M25611 Stiffness of right shoulder, not elsewhere classified: Secondary | ICD-10-CM | POA: Diagnosis not present

## 2018-04-08 NOTE — Therapy (Signed)
[place] [facility] [address] [place], [address] Phone: [phone]   Fax:  [phone]  Physical Therapy Treatment  Patient Details  Name: Michele Long MRN: [id number] Date of Birth: [birth year] No data recorded  Encounter Date: 04/08/2018  PT End of Session - 04/08/18 1658    Visit Number  17    Number of Visits  25    Date for PT Re-Evaluation  04/29/18    PT Start Time  1600    PT Stop Time  1645    PT Time Calculation (min)  45 min    Activity Tolerance  Patient tolerated treatment well    Behavior During Therapy  [facility] for tasks assessed/performed       Past Medical History:  Diagnosis Date  . Allergy   . Depression   . DVT (deep venous thrombosis) ([place])   . Edema   . Frequent headaches   . Gastric ulcer 10/27/2015  . GERD (gastroesophageal reflux disease)   . HTN (hypertension)   . Microalbuminuria   . Migraine   . Shingles   . Symptomatic menopausal or female climacteric states   . UTI (lower urinary tract infection)     Past Surgical History:  Procedure Laterality Date  . AUGMENTATION MAMMAPLASTY Bilateral 1983  . BREAST ENHANCEMENT SURGERY  1983  . NASAL SEPTUM SURGERY  2000    There were no vitals filed for this visit.  Subjective Assessment - 04/08/18 1639    Subjective  Patient states she feels her shoulder is improving overall but still has difficulty with raising her arm overhead. Patient reports she continues to perform her HEP.     Limitations  House hold activities;Lifting    Patient Stated Goals  get back to everyday tasks, be able to do yardwork and reach up in cabinents    Currently in Pain?  Yes    Pain Score  2     Pain Location  Shoulder    Pain Orientation  Right    Pain Descriptors / Indicators  Aching    Pain Type  Chronic pain    Pain Onset  More than a month ago    Pain Frequency  Constant       Manual Therapy  Grade III Inferior, distraction and posterior  glenohumeral mobilizations to improve joint mobility, decrease pain, and improve AROM/PROM, patient in supine with roll under knees x7 min total   Scapular mobilizations (superior, inferior, upward rotation, downward rotation) to increase mobility of scapula throughout shoulder AROM and decrease muscle spasms and pain x5 min total     Therapeutic Exercises Straight arm arm circles straight - x 45 sec straight 2# (next visit 4#) Standing UE ranger flexion - x 30 3#; shoulder IR/ER - x 30 3# Overhead ball taps - x30 4.4# B UEs Standing scapular rows in standing - x 25 15# Seated High row at [place] 20# B shoulder ER with YTB - x 20   Patient demonstrates increased fatigue at the end of the session   PT Education - 04/08/18 1654    Education Details  B shoulder ER in standing with YTB added HEP    Person(s) Educated  Patient    Methods  Explanation;Demonstration    Comprehension  Verbalized understanding;Returned demonstration          PT Long Term Goals - 03/12/18 1650      PT LONG TERM GOAL #1   Title  Pt will be independent and compliant with HEP in order to maintain gains made in PT and return to prior level of funciton.    Baseline  Pt dependent on proper form and technique for HEP exercises 03/12/18: min cueing required for proper form    Time  4    Period  Weeks    Status  On-going      PT LONG TERM GOAL #2   Title  Pt will report worst pain in past week less than 2/10 in order to return to prior level of function    Baseline  4/10; 03/12/18: 3/10     Time  6    Period  Weeks    Status  On-going      PT LONG TERM GOAL #3   Title  Pt will demonstrate full, pain free, shoulder ROM in order to reach into cabinets, perform houshold activities and return to prior level of function.    Baseline  flex 100 deg, ABD 75 deg, ER 25, IR 15; 03/12/18: flexion 125, ABD 90 ER/IR deffered to next visit     Time  6    Period  Weeks    Status  On-going      PT LONG TERM GOAL #4    Title  Pt will improve FOTO score from 49 to 62 in order to demonstrate an improvement in function.    Baseline  current FOTO: 49; 03/12/18: deferred to next visit    Time  6    Period  Weeks    Status  On-going            Plan - 04/08/18 1659    Clinical Impression Statement  Patient demonstrates improvement with exercise with ability to perform greater amount of exercises before onset of fatigue indicating functional carryover between visitation sessions. Patient demonstrates improvement with overhead flexion but conitnues to have increased pain at end range. Patient will benefit from further skilled therapy focused on improving limitations to return to prior level of function.     Rehab Potential  Good    Clinical Impairments Affecting Rehab Potential  +motivated    PT Frequency  2x / week    PT Duration  6 weeks    PT Treatment/Interventions  ADLs/Self Care Home Management;Aquatic Therapy;Cryotherapy;Electrical Stimulation;Moist Heat;Ultrasound;Therapeutic activities;Therapeutic exercise;Patient/family education;Manual techniques;Passive range of motion;Dry needling    PT Next Visit Plan  STM to posterior RTC musculature, thoracic mobility     PT Home Exercise Plan  see education section    Consulted and Agree with Plan of Care  Patient       Patient will benefit from skilled therapeutic intervention in order to improve the following deficits and impairments:  Increased muscle spasms, Decreased range of motion, Impaired UE functional use, Pain, Decreased strength  Visit Diagnosis: Chronic right shoulder pain  Stiffness of right shoulder, not elsewhere classified     Problem List Patient Active Problem List   Diagnosis Date Noted  . Acute pain of right shoulder 01/06/2018  . Cervical stenosis (uterine cervix) 09/09/2017  . Elevated BP without diagnosis of hypertension 06/06/2017  . Monocular diplopia of right eye 10/22/2016  . Bilateral lower extremity edema 01/30/2016   . Obesity 12/27/2015  . Frequent headaches 10/27/2015  . Environmental allergies 10/27/2015  . DVT (deep venous thrombosis) ([place]) 10/27/2015  . Depression with anxiety 10/23/2015  . GERD (gastroesophageal reflux disease) 10/23/2015  . Vitamin D deficiency 10/23/2015    Blythe Stanford, PT DPT 04/08/2018, 5:22 PM  [place] [facility] [address] [place], [address] Phone: [phone]   Fax:  [phone]  Name: Anetria Harwick MRN: [id number] Date of Birth: 11/09/1950

## 2018-04-13 ENCOUNTER — Ambulatory Visit (INDEPENDENT_AMBULATORY_CARE_PROVIDER_SITE_OTHER): Payer: PPO | Admitting: Sports Medicine

## 2018-04-13 ENCOUNTER — Ambulatory Visit: Payer: PPO

## 2018-04-13 ENCOUNTER — Ambulatory Visit: Payer: Self-pay | Admitting: Sports Medicine

## 2018-04-13 VITALS — BP 120/84 | Ht 66.0 in | Wt 220.0 lb

## 2018-04-13 DIAGNOSIS — G8929 Other chronic pain: Secondary | ICD-10-CM

## 2018-04-13 DIAGNOSIS — M25511 Pain in right shoulder: Secondary | ICD-10-CM | POA: Diagnosis not present

## 2018-04-13 DIAGNOSIS — M25611 Stiffness of right shoulder, not elsewhere classified: Secondary | ICD-10-CM | POA: Diagnosis not present

## 2018-04-13 NOTE — Progress Notes (Signed)
   Subjective:    Patient ID: Michele Long, female    DOB: 1950/10/20, 67 y.o.   MRN: [id number]  HPI   Patient comes in today for follow-up on right shoulder pain.  Overall her symptoms are improving.  She has had several sessions of physical therapy and has found it to be beneficial.  No nighttime pain.   Review of Systems    As above  Objective:   Physical Exam  Well-developed, well-nourished.  No acute distress.  Awake alert and oriented x3.  Vital signs reviewed  Right shoulder: Patient still has limited active range of motion but she does have active forward flexion to about 145 degrees.  Internal and external rotation are also limited.  Rotator cuff strength remains 5/5 with no significant pain with resisted supraspinatus or external rotation.  Full passive external rotation of the shoulder.  Neurovascularly intact distally.      Assessment & Plan:   Improving right shoulder pain secondary to rotator cuff tendinopathy  Patient states that she is about 50% improved.  I will have her continue in physical therapy where she may wean to a home exercise program per the therapist's discretion.  If patient's improvement plateaus or symptoms worsen then I would reconsider merits of further diagnostic imaging at that time.  As long as symptoms improve to the point of resolution, she may follow-up with me as needed.

## 2018-04-13 NOTE — Therapy (Signed)
[facility] [facility] [address], [place], [address] Phone: [phone]   Fax:  [phone]  Physical Therapy Treatment  Patient Details  Name: Michele Long MRN: [id number] Date of Birth: 08/31/1950 No data recorded  Encounter Date: 04/13/2018  PT End of Session - 04/13/18 1032    Visit Number  18    Number of Visits  25    Date for PT Re-Evaluation  04/29/18    PT Start Time  0945    PT Stop Time  1030    PT Time Calculation (min)  45 min    Activity Tolerance  Patient tolerated treatment well    Behavior During Therapy  [facility] for tasks assessed/performed       Past Medical History:  Diagnosis Date  . Allergy   . Depression   . DVT (deep venous thrombosis) ([place])   . Edema   . Frequent headaches   . Gastric ulcer 10/27/2015  . GERD (gastroesophageal reflux disease)   . HTN (hypertension)   . Microalbuminuria   . Migraine   . Shingles   . Symptomatic menopausal or female climacteric states   . UTI (lower urinary tract infection)     Past Surgical History:  Procedure Laterality Date  . AUGMENTATION MAMMAPLASTY Bilateral 1983  . BREAST ENHANCEMENT SURGERY  1983  . NASAL SEPTUM SURGERY  2000    There were no vitals filed for this visit.  Subjective Assessment - 04/13/18 0950    Subjective  Patient states she feels the shoulder is improving but continues to have increased difficulty with performing overhead movement.     Limitations  House hold activities;Lifting    Patient Stated Goals  get back to everyday tasks, be able to do yardwork and reach up in cabinents    Currently in Pain?  No/denies    Pain Onset  More than a month ago       Manual Therapy  Grade III Inferior, distraction and posterior glenohumeral mobilizations to improve joint mobility, decrease pain, and improve AROM/PROM, patient in supine with roll under knees x7 min total   Scapular mobilizations (superior, inferior, upward  rotation, downward rotation) to increase mobility of scapula throughout shoulder AROM and decrease muscle spasms and pain x5 min total     Therapeutic Exercises B shoulder ER with YTB - x 20 Straight arm arm circles straight - x 45 sec 4.4# Overhead ball taps - x30 4.4#  Serratus punches with YTB - x20 Standing UE ranger flexion - x 30 4#;  Standing scapular rows in standing - x 25 20# at [facility] Seated High row at [facility] - 2x20 25# 90-90 shoulder ER - x 20 YTB  Patient demonstrates increased fatigue at the end of the session     PT Education - 04/13/18 1031    Education Details  Continuing to perform HEP; form/technique with exercise    Person(s) Educated  Patient    Methods  Explanation;Demonstration    Comprehension  Verbalized understanding;Returned demonstration          PT Long Term Goals - 03/12/18 1650      PT LONG TERM GOAL #1   Title  Pt will be independent and compliant with HEP in order to maintain gains made in PT and return to prior level of funciton.    Baseline  Pt dependent on proper form and technique for HEP exercises 03/12/18: min cueing required for proper form    Time  4    Period  Weeks    Status  On-going      PT LONG TERM GOAL #2   Title  Pt will report worst pain in past week less than 2/10 in order to return to prior level of function    Baseline  4/10; 03/12/18: 3/10     Time  6    Period  Weeks    Status  On-going      PT LONG TERM GOAL #3   Title  Pt will demonstrate full, pain free, shoulder ROM in order to reach into cabinets, perform houshold activities and return to prior level of function.    Baseline  flex 100 deg, ABD 75 deg, ER 25, IR 15; 03/12/18: flexion 125, ABD 90 ER/IR deffered to next visit     Time  6    Period  Weeks    Status  On-going      PT LONG TERM GOAL #4   Title  Pt will improve FOTO score from 49 to 62 in order to demonstrate an improvement in function.    Baseline  current FOTO: 49; 03/12/18: deferred to next visit     Time  6    Period  Weeks    Status  On-going            Plan - 04/13/18 1032    Clinical Impression Statement  Patient demonstrates improvement with overhealth flexion with ability to attain 145 degrees compared to much less AROM compared to previous sessions indicating functional carryover between treatment sessions. Although patient is improving, she contineus to lack ~15 degrees from her contralateral side indicting poor strength and tissue limitations. Patient will benefit from further skilled therapy to return to prior level of function.     Rehab Potential  Good    Clinical Impairments Affecting Rehab Potential  +motivated    PT Frequency  2x / week    PT Duration  6 weeks    PT Treatment/Interventions  ADLs/Self Care Home Management;Aquatic Therapy;Cryotherapy;Electrical Stimulation;Moist Heat;Ultrasound;Therapeutic activities;Therapeutic exercise;Patient/family education;Manual techniques;Passive range of motion;Dry needling    PT Next Visit Plan  STM to posterior RTC musculature, thoracic mobility     PT Home Exercise Plan  see education section    Consulted and Agree with Plan of Care  Patient       Patient will benefit from skilled therapeutic intervention in order to improve the following deficits and impairments:  Increased muscle spasms, Decreased range of motion, Impaired UE functional use, Pain, Decreased strength  Visit Diagnosis: Stiffness of right shoulder, not elsewhere classified  Chronic right shoulder pain     Problem List Patient Active Problem List   Diagnosis Date Noted  . Acute pain of right shoulder 01/06/2018  . Cervical stenosis (uterine cervix) 09/09/2017  . Elevated BP without diagnosis of hypertension 06/06/2017  . Monocular diplopia of right eye 10/22/2016  . Bilateral lower extremity edema 01/30/2016  . Obesity 12/27/2015  . Frequent headaches 10/27/2015  . Environmental allergies 10/27/2015  . DVT (deep venous thrombosis) ([place])  10/27/2015  . Depression with anxiety 10/23/2015  . GERD (gastroesophageal reflux disease) 10/23/2015  . Vitamin D deficiency 10/23/2015    Blythe Stanford, PT DPT 04/13/2018, 11:02 AM  [facility] [address], [place], [address] Phone: [phone]   Fax:  [phone]  Name: Michele Long MRN: [id number] Date of Birth: [birth year]

## 2018-04-15 ENCOUNTER — Ambulatory Visit: Payer: PPO

## 2018-04-15 DIAGNOSIS — M25611 Stiffness of right shoulder, not elsewhere classified: Secondary | ICD-10-CM | POA: Diagnosis not present

## 2018-04-15 DIAGNOSIS — G8929 Other chronic pain: Secondary | ICD-10-CM

## 2018-04-15 DIAGNOSIS — M25511 Pain in right shoulder: Secondary | ICD-10-CM

## 2018-04-15 NOTE — Therapy (Signed)
[facility] [address], [place], [address] Phone: [phone]   Fax:  [phone]  Physical Therapy Treatment  Patient Details  Name: Michele Long MRN: [id number] Date of Birth: 1950-10-05 No data recorded  Encounter Date: 04/15/2018  PT End of Session - 04/15/18 1500    Visit Number  19    Number of Visits  25    Date for PT Re-Evaluation  04/29/18    PT Start Time  1430    PT Stop Time  1515    PT Time Calculation (min)  45 min    Activity Tolerance  Patient tolerated treatment well    Behavior During Therapy  [facility] for tasks assessed/performed       Past Medical History:  Diagnosis Date  . Allergy   . Depression   . DVT (deep venous thrombosis) ([place])   . Edema   . Frequent headaches   . Gastric ulcer 10/27/2015  . GERD (gastroesophageal reflux disease)   . HTN (hypertension)   . Microalbuminuria   . Migraine   . Shingles   . Symptomatic menopausal or female climacteric states   . UTI (lower urinary tract infection)     Past Surgical History:  Procedure Laterality Date  . AUGMENTATION MAMMAPLASTY Bilateral 1983  . BREAST ENHANCEMENT SURGERY  1983  . NASAL SEPTUM SURGERY  2000    There were no vitals filed for this visit.  Subjective Assessment - 04/15/18 1455    Subjective  Patient states her shoulder continues to improve. Patient reports her doctor would like her to continue with physical therapy as long as it is medicallly fit.     Limitations  House hold activities;Lifting    Patient Stated Goals  get back to everyday tasks, be able to do yardwork and reach up in cabinents    Currently in Pain?  No/denies    Pain Onset  More than a month ago       Manual Therapy  Grade III Inferior, distraction and posterior glenohumeral mobilizations to improve joint mobility, decrease pain, and improve AROM/PROM, patient in supine with roll under knees x7 min total   Scapular mobilizations  (superior, inferior, upward rotation, downward rotation) to increase mobility of scapula throughout shoulder AROM and decrease muscle spasms and pain x5 min total     Therapeutic Exercises Straight arm arm circles straight - x 45 sec 4.4# Overhead ball taps - 2 x20 4.4#  Standing scapular rows in standing - x 25 20# at [employer] Straight arm push downs at [employer] - x 20  Standing shoulder flexion with physioball - x 20  Serratus punches with BTB - x20   Patient demonstrates increased fatigue at the end of the session  PT Education - 04/15/18 1458    Education Details  form/technique with exercise    Person(s) Educated  Patient    Methods  Demonstration;Explanation    Comprehension  Verbalized understanding;Returned demonstration          PT Long Term Goals - 03/12/18 1650      PT LONG TERM GOAL #1   Title  Pt will be independent and compliant with HEP in order to maintain gains made in PT and return to prior level of funciton.    Baseline  Pt dependent on proper form and technique for HEP exercises 03/12/18: min cueing required for proper form    Time  4    Period  Weeks  Status  On-going      PT LONG TERM GOAL #2   Title  Pt will report worst pain in past week less than 2/10 in order to return to prior level of function    Baseline  4/10; 03/12/18: 3/10     Time  6    Period  Weeks    Status  On-going      PT LONG TERM GOAL #3   Title  Pt will demonstrate full, pain free, shoulder ROM in order to reach into cabinets, perform houshold activities and return to prior level of function.    Baseline  flex 100 deg, ABD 75 deg, ER 25, IR 15; 03/12/18: flexion 125, ABD 90 ER/IR deffered to next visit     Time  6    Period  Weeks    Status  On-going      PT LONG TERM GOAL #4   Title  Pt will improve FOTO score from 49 to 62 in order to demonstrate an improvement in function.    Baseline  current FOTO: 49; 03/12/18: deferred to next visit    Time  6    Period  Weeks    Status   On-going            Plan - 04/15/18 1504    Clinical Impression Statement  Patient demonstrates improvement  in shoulder flexion with ability to raise her arm to 150degrees without increase in pain indicating functional carryover between sessions. Patient reports improvement with exercise but continues to fatigue quickly with exercise. Patient will benefit from further skilled therapy to return to prior level of function.     Rehab Potential  Good    Clinical Impairments Affecting Rehab Potential  +motivated    PT Frequency  2x / week    PT Duration  6 weeks    PT Treatment/Interventions  ADLs/Self Care Home Management;Aquatic Therapy;Cryotherapy;Electrical Stimulation;Moist Heat;Ultrasound;Therapeutic activities;Therapeutic exercise;Patient/family education;Manual techniques;Passive range of motion;Dry needling    PT Next Visit Plan  STM to posterior RTC musculature, thoracic mobility     PT Home Exercise Plan  see education section    Consulted and Agree with Plan of Care  Patient       Patient will benefit from skilled therapeutic intervention in order to improve the following deficits and impairments:  Increased muscle spasms, Decreased range of motion, Impaired UE functional use, Pain, Decreased strength  Visit Diagnosis: Stiffness of right shoulder, not elsewhere classified  Chronic right shoulder pain     Problem List Patient Active Problem List   Diagnosis Date Noted  . Acute pain of right shoulder 01/06/2018  . Cervical stenosis (uterine cervix) 09/09/2017  . Elevated BP without diagnosis of hypertension 06/06/2017  . Monocular diplopia of right eye 10/22/2016  . Bilateral lower extremity edema 01/30/2016  . Obesity 12/27/2015  . Frequent headaches 10/27/2015  . Environmental allergies 10/27/2015  . DVT (deep venous thrombosis) ([place]) 10/27/2015  . Depression with anxiety 10/23/2015  . GERD (gastroesophageal reflux disease) 10/23/2015  . Vitamin D deficiency  10/23/2015    Blythe Stanford, PT DPT 04/15/2018, 3:16 PM  [facility] [address] [place], [address] Phone: [phone]   Fax:  [phone]  Name: Michele Long MRN: [id number] Date of Birth: 19-Jun-1951

## 2018-04-20 ENCOUNTER — Ambulatory Visit: Payer: PPO

## 2018-04-22 ENCOUNTER — Ambulatory Visit: Payer: PPO

## 2018-04-22 ENCOUNTER — Ambulatory Visit (INDEPENDENT_AMBULATORY_CARE_PROVIDER_SITE_OTHER): Payer: PPO | Admitting: Family Medicine

## 2018-04-22 ENCOUNTER — Encounter: Payer: Self-pay | Admitting: Family Medicine

## 2018-04-22 VITALS — BP 140/84 | HR 83 | Temp 98.8°F | Ht 66.0 in | Wt 221.6 lb

## 2018-04-22 DIAGNOSIS — Z114 Encounter for screening for human immunodeficiency virus [HIV]: Secondary | ICD-10-CM | POA: Diagnosis not present

## 2018-04-22 DIAGNOSIS — K219 Gastro-esophageal reflux disease without esophagitis: Secondary | ICD-10-CM | POA: Diagnosis not present

## 2018-04-22 DIAGNOSIS — R7303 Prediabetes: Secondary | ICD-10-CM

## 2018-04-22 DIAGNOSIS — F418 Other specified anxiety disorders: Secondary | ICD-10-CM | POA: Diagnosis not present

## 2018-04-22 DIAGNOSIS — M25611 Stiffness of right shoulder, not elsewhere classified: Secondary | ICD-10-CM

## 2018-04-22 DIAGNOSIS — Z23 Encounter for immunization: Secondary | ICD-10-CM

## 2018-04-22 DIAGNOSIS — Z1159 Encounter for screening for other viral diseases: Secondary | ICD-10-CM | POA: Diagnosis not present

## 2018-04-22 DIAGNOSIS — M25511 Pain in right shoulder: Secondary | ICD-10-CM

## 2018-04-22 DIAGNOSIS — R600 Localized edema: Secondary | ICD-10-CM | POA: Diagnosis not present

## 2018-04-22 DIAGNOSIS — H532 Diplopia: Secondary | ICD-10-CM | POA: Diagnosis not present

## 2018-04-22 DIAGNOSIS — G8929 Other chronic pain: Secondary | ICD-10-CM

## 2018-04-22 DIAGNOSIS — E559 Vitamin D deficiency, unspecified: Secondary | ICD-10-CM

## 2018-04-22 MED ORDER — FUROSEMIDE 20 MG PO TABS: 20.0000 mg | ORAL_TABLET | Freq: Every day | ORAL | 1 refills | Status: DC

## 2018-04-22 MED ORDER — PNEUMOCOCCAL 13-VAL CONJ VACC IM SUSP: 0.5000 mL | Freq: Once | INTRAMUSCULAR | 0 refills | Status: AC

## 2018-04-22 MED ORDER — TETANUS-DIPHTH-ACELL PERTUSSIS 5-2.5-18.5 LF-MCG/0.5 IM SUSP: 0.5000 mL | Freq: Once | INTRAMUSCULAR | 0 refills | Status: AC

## 2018-04-22 MED ORDER — PANTOPRAZOLE SODIUM 40 MG PO TBEC: 40.0000 mg | DELAYED_RELEASE_TABLET | Freq: Every day | ORAL | 1 refills | Status: DC

## 2018-04-22 MED ORDER — CITALOPRAM HYDROBROMIDE 40 MG PO TABS: 40.0000 mg | ORAL_TABLET | Freq: Every day | ORAL | 0 refills | Status: DC

## 2018-04-22 NOTE — Patient Instructions (Signed)
Nice to see you. I have refilled your medications. Please see your eye doctor for recheck of your vision. Please get your pneumonia vaccine and tetanus vaccination at the pharmacy.

## 2018-04-22 NOTE — Therapy (Signed)
[facility] [address] [place], [address] Phone: [phone]   Fax:  [phone]  Physical Therapy Treatment  Patient Details  Name: Michele Long MRN: [id number] Date of Birth: 09-26-1950 No data recorded  Encounter Date: 04/22/2018  PT End of Session - 04/22/18 1727    Visit Number  20    Number of Visits  25    Date for PT Re-Evaluation  04/29/18    PT Start Time  6314    PT Stop Time  1730    PT Time Calculation (min)  40 min    Activity Tolerance  Patient tolerated treatment well    Behavior During Therapy  [facility] for tasks assessed/performed       Past Medical History:  Diagnosis Date  . Allergy   . Depression   . DVT (deep venous thrombosis) ([place])   . Edema   . Frequent headaches   . Gastric ulcer 10/27/2015  . GERD (gastroesophageal reflux disease)   . HTN (hypertension)   . Microalbuminuria   . Migraine   . Monocular diplopia of right eye 10/22/2016  . Shingles   . Symptomatic menopausal or female climacteric states   . UTI (lower urinary tract infection)     Past Surgical History:  Procedure Laterality Date  . AUGMENTATION MAMMAPLASTY Bilateral 1983  . BREAST ENHANCEMENT SURGERY  1983  . NASAL SEPTUM SURGERY  2000    There were no vitals filed for this visit.  Subjective Assessment - 04/22/18 1654    Subjective  Patient reports her arms is slightly more sore today then it previous has been. Patient reports no major changes otherwise.     Limitations  House hold activities;Lifting    Patient Stated Goals  get back to everyday tasks, be able to do yardwork and reach up in cabinents    Currently in Pain?  No/denies    Pain Onset  More than a month ago        Manual Therapy  Grade III Inferior, distraction and posterior glenohumeral mobilizations to improve joint mobility, decrease pain, and improve AROM/PROM, patient in supine with roll under knees x7 min total   Scapular  mobilizations (superior, inferior, upward rotation, downward rotation) to increase mobility of scapula throughout shoulder AROM and decrease muscle spasms and pain x5 min total     Therapeutic Exercises Isometrics holds in supine for shoulder extension --  25 Standing scapular rows in standing - x 25 20# at [facility] Standing shoulder flexion with physioball - x 20     Patient demonstrates increased fatigue at the end of the session   PT Education - 04/22/18 1727    Education Details  form/technique with exercise    Person(s) Educated  Patient    Methods  Explanation;Demonstration    Comprehension  Verbalized understanding;Returned demonstration          PT Long Term Goals - 03/12/18 1650      PT LONG TERM GOAL #1   Title  Pt will be independent and compliant with HEP in order to maintain gains made in PT and return to prior level of funciton.    Baseline  Pt dependent on proper form and technique for HEP exercises 03/12/18: min cueing required for proper form    Time  4    Period  Weeks    Status  On-going      PT LONG TERM GOAL #2   Title  Pt will  report worst pain in past week less than 2/10 in order to return to prior level of function    Baseline  4/10; 03/12/18: 3/10     Time  6    Period  Weeks    Status  On-going      PT LONG TERM GOAL #3   Title  Pt will demonstrate full, pain free, shoulder ROM in order to reach into cabinets, perform houshold activities and return to prior level of function.    Baseline  flex 100 deg, ABD 75 deg, ER 25, IR 15; 03/12/18: flexion 125, ABD 90 ER/IR deffered to next visit     Time  6    Period  Weeks    Status  On-going      PT LONG TERM GOAL #4   Title  Pt will improve FOTO score from 49 to 62 in order to demonstrate an improvement in function.    Baseline  current FOTO: 49; 03/12/18: deferred to next visit    Time  6    Period  Weeks    Status  On-going            Plan - 04/22/18 1732    Clinical Impression Statement   Patient demonstrates increased pain along her shoulder flexors most notably along ant delt which coinsides with increased pain with performing overhead flexion. Patient demonstrates improvement in overhead motion after performing manual therapy indicating improvement in pain and spasms. Patient will benefit from further skilled therapy to return to prior level of function.     Rehab Potential  Good    Clinical Impairments Affecting Rehab Potential  +motivated    PT Frequency  2x / week    PT Duration  6 weeks    PT Treatment/Interventions  ADLs/Self Care Home Management;Aquatic Therapy;Cryotherapy;Electrical Stimulation;Moist Heat;Ultrasound;Therapeutic activities;Therapeutic exercise;Patient/family education;Manual techniques;Passive range of motion;Dry needling    PT Next Visit Plan  STM to posterior RTC musculature, thoracic mobility     PT Home Exercise Plan  see education section    Consulted and Agree with Plan of Care  Patient       Patient will benefit from skilled therapeutic intervention in order to improve the following deficits and impairments:  Increased muscle spasms, Decreased range of motion, Impaired UE functional use, Pain, Decreased strength  Visit Diagnosis: Stiffness of right shoulder, not elsewhere classified  Chronic right shoulder pain     Problem List Patient Active Problem List   Diagnosis Date Noted  . Acute pain of right shoulder 01/06/2018  . Cervical stenosis (uterine cervix) 09/09/2017  . Elevated BP without diagnosis of hypertension 06/06/2017  . Bilateral lower extremity edema 01/30/2016  . Obesity 12/27/2015  . Frequent headaches 10/27/2015  . Environmental allergies 10/27/2015  . DVT (deep venous thrombosis) ([place]) 10/27/2015  . Depression with anxiety 10/23/2015  . GERD (gastroesophageal reflux disease) 10/23/2015  . Vitamin D deficiency 10/23/2015    Blythe Stanford, PT DPT 04/22/2018, 5:35 PM  [facility] [address] [place], [address] Phone: [phone]   Fax:  [phone]  Name: Michele Long MRN: [id number] Date of Birth: 1950-11-29

## 2018-04-22 NOTE — Assessment & Plan Note (Signed)
Stable on Lasix.  Check electrolytes and kidney function.

## 2018-04-22 NOTE — Assessment & Plan Note (Signed)
Improving with physical therapy and injection.  She will continue to monitor.

## 2018-04-22 NOTE — Progress Notes (Signed)
Tommi Rumps, MD Phone: [phone]  Michele Long is a 67 y.o. female who presents today for f/u.  CC: depression, right shoulder pain, edema, gerd  Depression: Notes this is improved.  Not much anxiety.  She has stopped Xanax.  She continues on Celexa and Wellbutrin.  Right shoulder pain: She received an injection from sports medicine which was beneficial.  She has been doing physical therapy which has been helpful.  Chronic lower extremity edema has been stable.  Lasix is beneficial.  No orthopnea or PND.  No shortness of breath.  No chest pain.  GERD: No reflux symptoms on Protonix.  No dysphagia, abdominal pain, or blood in her stool.  Patient previously saw ophthalmology for her monocular double vision.  No obvious cause was found and it has not recurred.  She is due to see them for follow-up she reports slight blurring of her vision bilaterally.  Social History   Tobacco Use  Smoking Status Never Smoker  Smokeless Tobacco Never Used     ROS see history of present illness  Objective  Physical Exam Vitals:   04/22/18 1406  BP: 140/84  Pulse: 83  Temp: 98.8 F (37.1 C)  SpO2: 97%    BP Readings from Last 3 Encounters:  04/22/18 140/84  04/13/18 120/84  03/23/18 (!) 132/92   Wt Readings from Last 3 Encounters:  04/22/18 221 lb 9.6 oz (100.5 kg)  04/13/18 220 lb (99.8 kg)  03/23/18 220 lb (99.8 kg)    Physical Exam  Constitutional: No distress.  Eyes: Pupils are equal, round, and reactive to light. Conjunctivae are normal.  Cardiovascular: Normal rate, regular rhythm and normal heart sounds.  Pulmonary/Chest: Effort normal and breath sounds normal.  Musculoskeletal: She exhibits no edema.  Neurological: She is alert.  Skin: Skin is warm and dry. She is not diaphoretic.     Assessment/Plan: Please see individual problem list.  Bilateral lower extremity edema Stable on Lasix.  Check electrolytes and kidney function.  Acute pain of right  shoulder Improving with physical therapy and injection.  She will continue to monitor.  GERD (gastroesophageal reflux disease) Asymptomatic.  Continue Protonix.  Monocular diplopia of right eye No recurrence.  She will continue to follow with ophthalmology.  Vitamin D deficiency Check vitamin D with labs.  Depression with anxiety Improved.  Continue current regimen.   Health Maintenance: Check hepatitis C and HIV screening tests.  Prescription for Prevnar and tetanus vaccination given.  Orders Placed This Encounter  Procedures  . Flu vaccine HIGH DOSE PF (Fluzone High dose)  . Comp Met (CMET)  . HgB A1c  . Hepatitis C Antibody  . HIV antibody (with reflex)  . Vitamin D (25 hydroxy)    Meds ordered this encounter  Medications  . citalopram (CELEXA) 40 MG tablet    Sig: Take 1 tablet (40 mg total) by mouth daily.    Dispense:  90 tablet    Refill:  0  . furosemide (LASIX) 20 MG tablet    Sig: Take 1 tablet (20 mg total) by mouth daily.    Dispense:  90 tablet    Refill:  1  . pantoprazole (PROTONIX) 40 MG tablet    Sig: Take 1 tablet (40 mg total) by mouth daily.    Dispense:  90 tablet    Refill:  1  . pneumococcal 13-valent conjugate vaccine (PREVNAR 13) SUSP injection    Sig: Inject 0.5 mLs into the muscle once for 1 dose.    Dispense:  0.5 mL    Refill:  0  . Tdap (BOOSTRIX) 5-2.5-18.5 LF-MCG/0.5 injection    Sig: Inject 0.5 mLs into the muscle once for 1 dose.    Dispense:  0.5 mL    Refill:  0     Tommi Rumps, MD [place]

## 2018-04-22 NOTE — Assessment & Plan Note (Signed)
Asymptomatic. Continue Protonix. 

## 2018-04-22 NOTE — Assessment & Plan Note (Signed)
No recurrence.  She will continue to follow with ophthalmology.

## 2018-04-22 NOTE — Assessment & Plan Note (Signed)
Check vitamin D with labs.

## 2018-04-22 NOTE — Assessment & Plan Note (Signed)
Improved.  Continue current regimen

## 2018-04-23 LAB — COMPREHENSIVE METABOLIC PANEL
ALT: 18 U/L (ref 0–35)
AST: 18 U/L (ref 0–37)
Albumin: 4.3 g/dL (ref 3.5–5.2)
Alkaline Phosphatase: 53 U/L (ref 39–117)
BILIRUBIN TOTAL: 0.8 mg/dL (ref 0.2–1.2)
BUN: 14 mg/dL (ref 6–23)
CALCIUM: 9.3 mg/dL (ref 8.4–10.5)
CO2: 29 mEq/L (ref 19–32)
CREATININE: 1.14 mg/dL (ref 0.40–1.20)
Chloride: 105 mEq/L (ref 96–112)
GFR: 50.69 mL/min — ABNORMAL LOW (ref >60.00)
Glucose, Bld: 108 mg/dL — ABNORMAL HIGH (ref 70–99)
Potassium: 3.8 mEq/L (ref 3.5–5.1)
Sodium: 142 mEq/L (ref 135–145)
Total Protein: 7.3 g/dL (ref 6.0–8.3)

## 2018-04-23 LAB — HEMOGLOBIN A1C: HEMOGLOBIN A1C: 6 % (ref 4.6–6.5)

## 2018-04-23 LAB — HEPATITIS C ANTIBODY
Hepatitis C Ab: NONREACTIVE
SIGNAL TO CUT-OFF: 0.01 (ref <1.00)

## 2018-04-23 LAB — HIV ANTIBODY (ROUTINE TESTING W REFLEX): HIV 1&2 Ab, 4th Generation: NONREACTIVE

## 2018-04-23 LAB — VITAMIN D 25 HYDROXY (VIT D DEFICIENCY, FRACTURES): VITD: 36.6 ng/mL (ref 30.00–100.00)

## 2018-04-24 ENCOUNTER — Encounter: Payer: Self-pay | Admitting: Family Medicine

## 2018-04-27 ENCOUNTER — Ambulatory Visit: Payer: PPO

## 2018-04-27 DIAGNOSIS — G8929 Other chronic pain: Secondary | ICD-10-CM

## 2018-04-27 DIAGNOSIS — M25611 Stiffness of right shoulder, not elsewhere classified: Secondary | ICD-10-CM | POA: Diagnosis not present

## 2018-04-27 DIAGNOSIS — M25511 Pain in right shoulder: Secondary | ICD-10-CM

## 2018-04-27 NOTE — Therapy (Signed)
[facility] [address], [place], [address] Phone: [phone]   Fax:  [phone]  Physical Therapy Treatment  Patient Details  Name: Michele Long MRN: [id number] Date of Birth: 1951/07/25 No data recorded  Encounter Date: 04/27/2018  PT End of Session - 04/27/18 1233    Visit Number  21    Number of Visits  25    Date for PT Re-Evaluation  04/29/18    PT Start Time  0945    PT Stop Time  1030    PT Time Calculation (min)  45 min    Activity Tolerance  Patient tolerated treatment well    Behavior During Therapy  [facility] for tasks assessed/performed       Past Medical History:  Diagnosis Date  . Allergy   . Depression   . DVT (deep venous thrombosis) ([place])   . Edema   . Frequent headaches   . Gastric ulcer 10/27/2015  . GERD (gastroesophageal reflux disease)   . HTN (hypertension)   . Microalbuminuria   . Migraine   . Monocular diplopia of right eye 10/22/2016  . Shingles   . Symptomatic menopausal or female climacteric states   . UTI (lower urinary tract infection)     Past Surgical History:  Procedure Laterality Date  . AUGMENTATION MAMMAPLASTY Bilateral 1983  . BREAST ENHANCEMENT SURGERY  1983  . NASAL SEPTUM SURGERY  2000    There were no vitals filed for this visit.  Subjective Assessment - 04/27/18 1232    Subjective  Patient reports no major changes since the previous session and reports no major increase in pain.     Limitations  House hold activities;Lifting    Patient Stated Goals  get back to everyday tasks, be able to do yardwork and reach up in cabinents    Currently in Pain?  No/denies    Pain Onset  More than a month ago       TREATMENT Manual Therapy  Grade III Inferior, distraction and posterior glenohumeral mobilizations to improve joint mobility, decrease pain, and improve AROM/PROM, patient in supine with roll under knees x7 min Scapular mobilizations (superior,  inferior, upward rotation, downward rotation) to increase mobility of scapula throughout shoulder AROM and decrease muscle spasms and pain x5 min total    Therapeutic Exercises Standing scapular rows in standing - x 25 15# at [facility] Standing shoulder flexion overhead ball taps with physioball - x 20  Sidelying shoulder ER - x 20 3# Scapular depression in standing with OMEGA - x 20 5# UE ranger flexion - x20 2#     Patient demonstrates increased fatigue at the end of the session     PT Education - 04/27/18 1233    Education Details  form/technique with exercise    Person(s) Educated  Patient    Methods  Explanation;Demonstration    Comprehension  Verbalized understanding;Returned demonstration          PT Long Term Goals - 03/12/18 1650      PT LONG TERM GOAL #1   Title  Pt will be independent and compliant with HEP in order to maintain gains made in PT and return to prior level of funciton.    Baseline  Pt dependent on proper form and technique for HEP exercises 03/12/18: min cueing required for proper form    Time  4    Period  Weeks    Status  On-going  PT LONG TERM GOAL #2   Title  Pt will report worst pain in past week less than 2/10 in order to return to prior level of function    Baseline  4/10; 03/12/18: 3/10     Time  6    Period  Weeks    Status  On-going      PT LONG TERM GOAL #3   Title  Pt will demonstrate full, pain free, shoulder ROM in order to reach into cabinets, perform houshold activities and return to prior level of function.    Baseline  flex 100 deg, ABD 75 deg, ER 25, IR 15; 03/12/18: flexion 125, ABD 90 ER/IR deffered to next visit     Time  6    Period  Weeks    Status  On-going      PT LONG TERM GOAL #4   Title  Pt will improve FOTO score from 49 to 62 in order to demonstrate an improvement in function.    Baseline  current FOTO: 49; 03/12/18: deferred to next visit    Time  6    Period  Weeks    Status  On-going            Plan -  04/27/18 1234    Clinical Impression Statement  Patient demonstrates initial increase in pain with performing shoulder flexion but improves after PT performing scapular upward rotation assistance indictating poor scapular control. Patient demosntrates improvement with overall exercises and patient will benefit from further skilled therapy to return to prior level of function.     Rehab Potential  Good    Clinical Impairments Affecting Rehab Potential  +motivated    PT Frequency  2x / week    PT Duration  6 weeks    PT Treatment/Interventions  ADLs/Self Care Home Management;Aquatic Therapy;Cryotherapy;Electrical Stimulation;Moist Heat;Ultrasound;Therapeutic activities;Therapeutic exercise;Patient/family education;Manual techniques;Passive range of motion;Dry needling    PT Next Visit Plan  STM to posterior RTC musculature, thoracic mobility     PT Home Exercise Plan  see education section    Consulted and Agree with Plan of Care  Patient       Patient will benefit from skilled therapeutic intervention in order to improve the following deficits and impairments:  Increased muscle spasms, Decreased range of motion, Impaired UE functional use, Pain, Decreased strength  Visit Diagnosis: Stiffness of right shoulder, not elsewhere classified  Chronic right shoulder pain     Problem List Patient Active Problem List   Diagnosis Date Noted  . Acute pain of right shoulder 01/06/2018  . Cervical stenosis (uterine cervix) 09/09/2017  . Elevated BP without diagnosis of hypertension 06/06/2017  . Bilateral lower extremity edema 01/30/2016  . Obesity 12/27/2015  . Frequent headaches 10/27/2015  . Environmental allergies 10/27/2015  . DVT (deep venous thrombosis) ([place]) 10/27/2015  . Depression with anxiety 10/23/2015  . GERD (gastroesophageal reflux disease) 10/23/2015  . Vitamin D deficiency 10/23/2015    Blythe Stanford, PT DPT 04/27/2018, 12:39 PM  [facility] [address] [place], [address] Phone: [phone]   Fax:  [phone]  Name: Michele Long MRN: [id number] Date of Birth: 05-31-1951

## 2018-04-28 ENCOUNTER — Other Ambulatory Visit: Payer: Self-pay | Admitting: Family Medicine

## 2018-04-28 DIAGNOSIS — N183 Chronic kidney disease, stage 3 unspecified: Secondary | ICD-10-CM

## 2018-04-29 ENCOUNTER — Ambulatory Visit: Payer: PPO | Attending: Family Medicine

## 2018-04-29 DIAGNOSIS — G8929 Other chronic pain: Secondary | ICD-10-CM | POA: Diagnosis not present

## 2018-04-29 DIAGNOSIS — M25611 Stiffness of right shoulder, not elsewhere classified: Secondary | ICD-10-CM | POA: Insufficient documentation

## 2018-04-29 DIAGNOSIS — M25511 Pain in right shoulder: Secondary | ICD-10-CM | POA: Diagnosis not present

## 2018-04-29 NOTE — Therapy (Signed)
[facility] [address] [place], [address] Phone: [phone]   Fax:  [phone]  Physical Therapy Treatment  Patient Details  Name: Michele Long MRN: [id number] Date of Birth: 1951-02-22 No data recorded  Encounter Date: 04/29/2018  PT End of Session - 04/29/18 1343    Visit Number  22    Number of Visits  25    Date for PT Re-Evaluation  04/29/18    PT Start Time  1300    PT Stop Time  1345    PT Time Calculation (min)  45 min    Activity Tolerance  Patient tolerated treatment well    Behavior During Therapy  [facility] for tasks assessed/performed       Past Medical History:  Diagnosis Date  . Allergy   . Depression   . DVT (deep venous thrombosis) ([place])   . Edema   . Frequent headaches   . Gastric ulcer 10/27/2015  . GERD (gastroesophageal reflux disease)   . HTN (hypertension)   . Microalbuminuria   . Migraine   . Monocular diplopia of right eye 10/22/2016  . Shingles   . Symptomatic menopausal or female climacteric states   . UTI (lower urinary tract infection)     Past Surgical History:  Procedure Laterality Date  . AUGMENTATION MAMMAPLASTY Bilateral 1983  . BREAST ENHANCEMENT SURGERY  1983  . NASAL SEPTUM SURGERY  2000    There were no vitals filed for this visit.  Subjective Assessment - 04/29/18 1338    Subjective  Patient reports no major changes since the previous session and patient states she has been lifting her arm for exercise.    Limitations  House hold activities;Lifting    Patient Stated Goals  get back to everyday tasks, be able to do yardwork and reach up in cabinents    Currently in Pain?  No/denies    Pain Onset  More than a month ago       TREATMENT Manual Therapy  Grade III Inferior, distraction and posterior glenohumeral mobilizations to improve joint mobility, decrease pain, and improve AROM/PROM, patient in supine with roll under knees x7 min Scapular  mobilizations (superior, inferior, upward rotation, downward rotation) to increase mobility of scapula throughout shoulder AROM and decrease muscle spasms and pain x8 min total   Therapeutic Exercises Standing shoulder flexion overhead ball taps with physioball - x 20  Standing scapular rows in standing - x 25 17# at [facility] High row in sitting - x20 28# Overhead flexion with leaning into wall - x 10    Patient demonstrates increased fatigue at the end of the session    PT Education - 04/29/18 1341    Education Details  form/technique with exercise    Person(s) Educated  Patient    Methods  Explanation;Demonstration    Comprehension  Verbalized understanding;Returned demonstration          PT Long Term Goals - 03/12/18 1650      PT LONG TERM GOAL #1   Title  Pt will be independent and compliant with HEP in order to maintain gains made in PT and return to prior level of funciton.    Baseline  Pt dependent on proper form and technique for HEP exercises 03/12/18: min cueing required for proper form    Time  4    Period  Weeks    Status  On-going      PT LONG TERM GOAL #2  Title  Pt will report worst pain in past week less than 2/10 in order to return to prior level of function    Baseline  4/10; 03/12/18: 3/10     Time  6    Period  Weeks    Status  On-going      PT LONG TERM GOAL #3   Title  Pt will demonstrate full, pain free, shoulder ROM in order to reach into cabinets, perform houshold activities and return to prior level of function.    Baseline  flex 100 deg, ABD 75 deg, ER 25, IR 15; 03/12/18: flexion 125, ABD 90 ER/IR deffered to next visit     Time  6    Period  Weeks    Status  On-going      PT LONG TERM GOAL #4   Title  Pt will improve FOTO score from 49 to 62 in order to demonstrate an improvement in function.    Baseline  current FOTO: 49; 03/12/18: deferred to next visit    Time  6    Period  Weeks    Status  On-going            Plan - 04/29/18 1344     Clinical Impression Statement  Patient demonstrates decreased shoulder AROM however presents with improved strength compared to previous session. Instructed patient to perform overhead flexion in standing to improved shoulder AROM. Patient will benefit from further skilled therapy to return to prior level of function.     Rehab Potential  Good    Clinical Impairments Affecting Rehab Potential  +motivated    PT Frequency  2x / week    PT Duration  6 weeks    PT Treatment/Interventions  ADLs/Self Care Home Management;Aquatic Therapy;Cryotherapy;Electrical Stimulation;Moist Heat;Ultrasound;Therapeutic activities;Therapeutic exercise;Patient/family education;Manual techniques;Passive range of motion;Dry needling    PT Next Visit Plan  STM to posterior RTC musculature, thoracic mobility     PT Home Exercise Plan  see education section    Consulted and Agree with Plan of Care  Patient       Patient will benefit from skilled therapeutic intervention in order to improve the following deficits and impairments:  Increased muscle spasms, Decreased range of motion, Impaired UE functional use, Pain, Decreased strength  Visit Diagnosis: Chronic right shoulder pain  Stiffness of right shoulder, not elsewhere classified     Problem List Patient Active Problem List   Diagnosis Date Noted  . Acute pain of right shoulder 01/06/2018  . Cervical stenosis (uterine cervix) 09/09/2017  . Elevated BP without diagnosis of hypertension 06/06/2017  . Bilateral lower extremity edema 01/30/2016  . Obesity 12/27/2015  . Frequent headaches 10/27/2015  . Environmental allergies 10/27/2015  . DVT (deep venous thrombosis) ([place]) 10/27/2015  . Depression with anxiety 10/23/2015  . GERD (gastroesophageal reflux disease) 10/23/2015  . Vitamin D deficiency 10/23/2015    Blythe Stanford, PT DPT 04/29/2018, 1:45 PM  [facility] [address] [place], [address] Phone: [phone]   Fax:  [phone]  Name: Michele Long MRN: [id number] Date of Birth: Aug 26, 1950

## 2018-05-04 ENCOUNTER — Ambulatory Visit: Payer: PPO

## 2018-05-04 ENCOUNTER — Other Ambulatory Visit (INDEPENDENT_AMBULATORY_CARE_PROVIDER_SITE_OTHER): Payer: PPO

## 2018-05-04 DIAGNOSIS — M25611 Stiffness of right shoulder, not elsewhere classified: Secondary | ICD-10-CM

## 2018-05-04 DIAGNOSIS — N183 Chronic kidney disease, stage 3 unspecified: Secondary | ICD-10-CM

## 2018-05-04 DIAGNOSIS — M25511 Pain in right shoulder: Principal | ICD-10-CM

## 2018-05-04 DIAGNOSIS — G8929 Other chronic pain: Secondary | ICD-10-CM

## 2018-05-05 LAB — PROTEIN / CREATININE RATIO, URINE
CREATININE, URINE: 210 mg/dL (ref 20–275)
PROTEIN/CREAT RATIO: 38 mg/g{creat} (ref 21–161)
Total Protein, Urine: 8 mg/dL (ref 5–24)

## 2018-05-05 NOTE — Therapy (Addendum)
[facility] [address], [place], [address] Phone: [phone]   Fax:  [phone]  Physical Therapy Treatment/Progress Note  Patient Details  Name: Michele Long MRN: [id number] Date of Birth: 06/06/1951 No data recorded  Encounter Date: 05/04/2018  PT End of Session - 05/05/18 0757    Visit Number  23    Number of Visits  33    Date for PT Re-Evaluation  06/01/18    PT Start Time  9767    PT Stop Time  1600    PT Time Calculation (min)  45 min    Activity Tolerance  Patient tolerated treatment well    Behavior During Therapy  [facility] for tasks assessed/performed       Past Medical History:  Diagnosis Date  . Allergy   . Depression   . DVT (deep venous thrombosis) ([place])   . Edema   . Frequent headaches   . Gastric ulcer 10/27/2015  . GERD (gastroesophageal reflux disease)   . HTN (hypertension)   . Microalbuminuria   . Migraine   . Monocular diplopia of right eye 10/22/2016  . Shingles   . Symptomatic menopausal or female climacteric states   . UTI (lower urinary tract infection)     Past Surgical History:  Procedure Laterality Date  . AUGMENTATION MAMMAPLASTY Bilateral 1983  . BREAST ENHANCEMENT SURGERY  1983  . NASAL SEPTUM SURGERY  2000    There were no vitals filed for this visit.  Subjective Assessment - 05/05/18 0755    Subjective  Patient reports improvement in overall shoulder function with ability to raise her arm overhead to a greater degree.     Limitations  House hold activities;Lifting    Patient Stated Goals  get back to everyday tasks, be able to do yardwork and reach up in cabinents    Currently in Pain?  No/denies    Pain Onset  More than a month ago         TREATMENT  Manual Therapy  Grade III Inferior, distraction and posterior glenohumeral mobilizations to improve joint mobility, decrease pain, and improve AROM/PROM, patient in supine with roll under knees x6  min  Scapular mobilizations (superior, inferior, upward rotation, downward rotation) to increase mobility of scapula throughout shoulder AROM and decrease muscle spasms and pain x4 min total  STM along the base of the skull along sub occipital musculature to decrease increased neck and UT pain with patient positioned in supine - x 6 min   Therapeutic Exercises Standing shoulder flexion overhead with bringing body in and out - 2 x 10 Standing scapular rows in standing - x 25 15# at [place] the back weight passes - x 10 B directions Behind the head stretch with 2# weight - x 10  Standing YTB no money shoulder ER - x 25    Patient demonstrates increased fatigue at the end of the session  PT Education - 05/05/18 0756    Education Details  POC; form/technique with exercise     Person(s) Educated  Patient    Methods  Explanation;Demonstration    Comprehension  Verbalized understanding;Returned demonstration          PT Long Term Goals - 03/12/18 1650      PT LONG TERM GOAL #1   Title  Pt will be independent and compliant with HEP in order to maintain gains made in PT and return to prior level of funciton.    Baseline  Pt dependent on proper form and technique for HEP exercises 03/12/18: min cueing required for proper form    Time  4    Period  Weeks    Status  On-going      PT LONG TERM GOAL #2   Title  Pt will report worst pain in past week less than 2/10 in order to return to prior level of function    Baseline  4/10; 03/12/18: 3/10     Time  6    Period  Weeks    Status  On-going      PT LONG TERM GOAL #3   Title  Pt will demonstrate full, pain free, shoulder ROM in order to reach into cabinets, perform houshold activities and return to prior level of function.    Baseline  flex 100 deg, ABD 75 deg, ER 25, IR 15; 03/12/18: flexion 125, ABD 90 ER/IR deffered to next visit     Time  6    Period  Weeks    Status  On-going      PT LONG TERM GOAL #4   Title  Pt will  improve FOTO score from 49 to 62 in order to demonstrate an improvement in function.    Baseline  current FOTO: 49; 03/12/18: deferred to next visit    Time  6    Period  Weeks    Status  On-going            Plan - 05/05/18 0803    Clinical Impression Statement  Patient is making progress towards long term goals with improvement in NPRS worst pain, FOTO scores, shoulder AROM with flexion, ER, IR, and abduction indicating functional carryover with physical therapy. Although patient is improving, she continues to demonstrates decreased AROM with her affected UE compared to the unaffected extremity decreasing ability t oreach in high cabinets. Patient will benefit from further skilled therapy to return to prior level of function.     Rehab Potential  Good    Clinical Impairments Affecting Rehab Potential  +motivated    PT Frequency  2x / week    PT Duration  6 weeks    PT Treatment/Interventions  ADLs/Self Care Home Management;Aquatic Therapy;Cryotherapy;Electrical Stimulation;Moist Heat;Ultrasound;Therapeutic activities;Therapeutic exercise;Patient/family education;Manual techniques;Passive range of motion;Dry needling    PT Next Visit Plan  STM to posterior RTC musculature, thoracic mobility     PT Home Exercise Plan  see education section    Consulted and Agree with Plan of Care  Patient       Patient will benefit from skilled therapeutic intervention in order to improve the following deficits and impairments:  Increased muscle spasms, Decreased range of motion, Impaired UE functional use, Pain, Decreased strength  Visit Diagnosis: Chronic right shoulder pain  Stiffness of right shoulder, not elsewhere classified     Problem List Patient Active Problem List   Diagnosis Date Noted  . Acute pain of right shoulder 01/06/2018  . Cervical stenosis (uterine cervix) 09/09/2017  . Elevated BP without diagnosis of hypertension 06/06/2017  . Bilateral lower extremity edema 01/30/2016   . Obesity 12/27/2015  . Frequent headaches 10/27/2015  . Environmental allergies 10/27/2015  . DVT (deep venous thrombosis) ([employer]) 10/27/2015  . Depression with anxiety 10/23/2015  . GERD (gastroesophageal reflux disease) 10/23/2015  . Vitamin D deficiency 10/23/2015    Blythe Stanford, PT DPT 05/05/2018, 8:11 AM  [facility] [address], [place], [address] Phone: [phone]  Fax:  [phone]  Name: Michele Long MRN: [id number] Date of Birth: 11-07-1950

## 2018-05-06 ENCOUNTER — Ambulatory Visit: Payer: PPO

## 2018-05-06 DIAGNOSIS — G8929 Other chronic pain: Secondary | ICD-10-CM

## 2018-05-06 DIAGNOSIS — M25511 Pain in right shoulder: Principal | ICD-10-CM

## 2018-05-06 DIAGNOSIS — M25611 Stiffness of right shoulder, not elsewhere classified: Secondary | ICD-10-CM

## 2018-05-06 NOTE — Therapy (Signed)
[facility] [address] [place], [address] Phone: [phone]   Fax:  [phone]  Physical Therapy Treatment  Patient Details  Name: Michele Long MRN: [id number] Date of Birth: 1951-02-24 No data recorded  Encounter Date: 05/06/2018  PT End of Session - 05/06/18 1537    Visit Number  24    Number of Visits  33    Date for PT Re-Evaluation  06/01/18    PT Start Time  1430    PT Stop Time  1515    PT Time Calculation (min)  45 min    Activity Tolerance  Patient tolerated treatment well    Behavior During Therapy  [facility] for tasks assessed/performed       Past Medical History:  Diagnosis Date  . Allergy   . Depression   . DVT (deep venous thrombosis) ([place])   . Edema   . Frequent headaches   . Gastric ulcer 10/27/2015  . GERD (gastroesophageal reflux disease)   . HTN (hypertension)   . Microalbuminuria   . Migraine   . Monocular diplopia of right eye 10/22/2016  . Shingles   . Symptomatic menopausal or female climacteric states   . UTI (lower urinary tract infection)     Past Surgical History:  Procedure Laterality Date  . AUGMENTATION MAMMAPLASTY Bilateral 1983  . BREAST ENHANCEMENT SURGERY  1983  . NASAL SEPTUM SURGERY  2000    There were no vitals filed for this visit.  Subjective Assessment - 05/06/18 1534    Subjective  Pt reports that she continues to experience pain and lack of ROM in R shoulder. Pt reports aggravation of pain with lifting 25lb dog into vehicle.     Limitations  House hold activities;Lifting    Patient Stated Goals  get back to everyday tasks, be able to do yardwork and reach up in cabinents    Currently in Pain?  No/denies       TREATMENT  Manual Therapy  Grade III Inferior and posterior glenohumeral mobilizations to improve joint mobility, decrease pain, and improve AROM/PROM, patient in supine x8 min   Scapular mobilizations (superior, inferior, upward rotation,  downward rotation) to increase mobility of scapula throughout shoulder AROM and decrease muscle spasms and pain x9 min total  STM performed to the subscapularis and latissimus dorsi on the R side to decrease increased pain and spasms with patient positioned in supine - x 6 min utilizing ischemic compression techniques      Therapeutic Exercises Sidelying ER without resistance - x 20  UE ranger flexion at the wall - x 20  UE ranger abduction at the wall - x 20  B shoulder flexion with yellow physioball at the wall - x20 ER at the wall - x 20    Patient demonstrates increased fatigue at the end of the session    PT Education - 05/06/18 1536    Education Details  PT educated pt on form and technique with exercises.     Person(s) Educated  Patient    Methods  Explanation;Demonstration;Tactile cues;Verbal cues    Comprehension  Verbalized understanding;Returned demonstration          PT Long Term Goals - 03/12/18 1650      PT LONG TERM GOAL #1   Title  Pt will be independent and compliant with HEP in order to maintain gains made in PT and return to prior level of funciton.    Baseline  Pt dependent  on proper form and technique for HEP exercises 03/12/18: min cueing required for proper form    Time  4    Period  Weeks    Status  On-going      PT LONG TERM GOAL #2   Title  Pt will report worst pain in past week less than 2/10 in order to return to prior level of function    Baseline  4/10; 03/12/18: 3/10     Time  6    Period  Weeks    Status  On-going      PT LONG TERM GOAL #3   Title  Pt will demonstrate full, pain free, shoulder ROM in order to reach into cabinets, perform houshold activities and return to prior level of function.    Baseline  flex 100 deg, ABD 75 deg, ER 25, IR 15; 03/12/18: flexion 125, ABD 90 ER/IR deffered to next visit     Time  6    Period  Weeks    Status  On-going      PT LONG TERM GOAL #4   Title  Pt will improve FOTO score from 49 to 62 in order  to demonstrate an improvement in function.    Baseline  current FOTO: 49; 03/12/18: deferred to next visit    Time  6    Period  Weeks    Status  On-going            Plan - 05/06/18 1539    Clinical Impression Statement  Pt is progressing towards pain management and ROM goals but continues to demonstrate decreased active and passive ROM. Pt demo weakness in shoulder ER, abduction and flexion. PT will continue to utilize manual therapy, therapeautic exercise and ROM to return to prior level of function.     Rehab Potential  Good    Clinical Impairments Affecting Rehab Potential  +motivated    PT Frequency  2x / week    PT Duration  6 weeks    PT Treatment/Interventions  ADLs/Self Care Home Management;Aquatic Therapy;Cryotherapy;Electrical Stimulation;Moist Heat;Ultrasound;Therapeutic activities;Therapeutic exercise;Patient/family education;Manual techniques;Passive range of motion;Dry needling    PT Next Visit Plan  STM to posterior RTC musculature, thoracic mobility     PT Home Exercise Plan  see education section    Consulted and Agree with Plan of Care  Patient       Patient will benefit from skilled therapeutic intervention in order to improve the following deficits and impairments:  Increased muscle spasms, Decreased range of motion, Impaired UE functional use, Pain, Decreased strength  Visit Diagnosis: Chronic right shoulder pain  Stiffness of right shoulder, not elsewhere classified     Problem List Patient Active Problem List   Diagnosis Date Noted  . Acute pain of right shoulder 01/06/2018  . Cervical stenosis (uterine cervix) 09/09/2017  . Elevated BP without diagnosis of hypertension 06/06/2017  . Bilateral lower extremity edema 01/30/2016  . Obesity 12/27/2015  . Frequent headaches 10/27/2015  . Environmental allergies 10/27/2015  . DVT (deep venous thrombosis) ([place]) 10/27/2015  . Depression with anxiety 10/23/2015  . GERD (gastroesophageal reflux disease)  10/23/2015  . Vitamin D deficiency 10/23/2015    Karilyn Cota, SPT   Blythe Stanford, PT DPT 05/06/2018, 3:47 PM  [facility] [address], [place], [address] Phone: [phone]   Fax:  [phone]  Name: Michele Long MRN: [id number] Date of Birth: July 18, 1951

## 2018-05-11 ENCOUNTER — Ambulatory Visit: Payer: PPO

## 2018-05-11 DIAGNOSIS — M25511 Pain in right shoulder: Principal | ICD-10-CM

## 2018-05-11 DIAGNOSIS — G8929 Other chronic pain: Secondary | ICD-10-CM

## 2018-05-11 DIAGNOSIS — M25611 Stiffness of right shoulder, not elsewhere classified: Secondary | ICD-10-CM

## 2018-05-11 NOTE — Therapy (Signed)
[facility] [address] [place], [address] Phone: [phone]   Fax:  [phone]  Physical Therapy Treatment  Patient Details  Name: Michele Long MRN: [id number] Date of Birth: February 06, 1951 No data recorded  Encounter Date: 05/11/2018  PT End of Session - 05/11/18 1049    Visit Number  25    Number of Visits  33    Date for PT Re-Evaluation  06/01/18    PT Start Time  0945    PT Stop Time  1030    PT Time Calculation (min)  45 min    Activity Tolerance  Patient tolerated treatment well    Behavior During Therapy  [facility] for tasks assessed/performed       Past Medical History:  Diagnosis Date  . Allergy   . Depression   . DVT (deep venous thrombosis) ([place])   . Edema   . Frequent headaches   . Gastric ulcer 10/27/2015  . GERD (gastroesophageal reflux disease)   . HTN (hypertension)   . Microalbuminuria   . Migraine   . Monocular diplopia of right eye 10/22/2016  . Shingles   . Symptomatic menopausal or female climacteric states   . UTI (lower urinary tract infection)     Past Surgical History:  Procedure Laterality Date  . AUGMENTATION MAMMAPLASTY Bilateral 1983  . BREAST ENHANCEMENT SURGERY  1983  . NASAL SEPTUM SURGERY  2000    There were no vitals filed for this visit.  Subjective Assessment - 05/11/18 1044    Subjective  Pt reports improvement in pain and function since last visit. Pt reports she was able to garden and reach objects on overhead shelves with little to no difficulty. Pt reports soreness in the area of the deltoid attachment site at the end of the day.    Limitations  House hold activities;Lifting    Patient Stated Goals  get back to everyday tasks, be able to do yardwork and reach up in cabinents    Currently in Pain?  No/denies       TREATMENT  Manual Therapy  Grade III Inferior and posterior glenohumeral mobilizations to improve joint mobility, decrease pain, and  improve AROM/PROM, patient in supine x8 min   Scapular mobilizations (superior, inferior, upward rotation, downward rotation) to increase mobility of scapula throughout shoulder AROM and decrease muscle spasms and pain x7 min total     Therapeutic Exercises Sidelying ER without resistance - x 20  UE ranger flexion at the wall - x 20  UE ranger abduction at the wall - x 20  B shoulder flexion with yellow physioball at the wall - x20 ER at the wall - x 20 with muscle energy   Patient demonstrates increased fatigue at the end of the session  PT Education - 05/11/18 1046    Education Details  Pt educated on form and technique with exercises.    Person(s) Educated  Patient    Methods  Explanation;Demonstration    Comprehension  Verbalized understanding;Returned demonstration          PT Long Term Goals - 03/12/18 1650      PT LONG TERM GOAL #1   Title  Pt will be independent and compliant with HEP in order to maintain gains made in PT and return to prior level of funciton.    Baseline  Pt dependent on proper form and technique for HEP exercises 03/12/18: min cueing required for proper form    Time  4    Period  Weeks    Status  On-going      PT LONG TERM GOAL #2   Title  Pt will report worst pain in past week less than 2/10 in order to return to prior level of function    Baseline  4/10; 03/12/18: 3/10     Time  6    Period  Weeks    Status  On-going      PT LONG TERM GOAL #3   Title  Pt will demonstrate full, pain free, shoulder ROM in order to reach into cabinets, perform houshold activities and return to prior level of function.    Baseline  flex 100 deg, ABD 75 deg, ER 25, IR 15; 03/12/18: flexion 125, ABD 90 ER/IR deffered to next visit     Time  6    Period  Weeks    Status  On-going      PT LONG TERM GOAL #4   Title  Pt will improve FOTO score from 49 to 62 in order to demonstrate an improvement in function.    Baseline  current FOTO: 49; 03/12/18: deferred to next  visit    Time  6    Period  Weeks    Status  On-going            Plan - 05/11/18 1051    Clinical Impression Statement  Pt is progressing towards pain management and ROM goals. Today she presented with no pain and with little to no pain since prior visit. However, pt continues to demo weakness in ER, abduction and flexion. Pt also continues to demo limited ROM in abduction and ER. PT will continue to utilize manual therapy, therapeutic exercise and ROM to return to prior level of function.    Rehab Potential  Good    Clinical Impairments Affecting Rehab Potential  +motivated    PT Frequency  2x / week    PT Duration  6 weeks    PT Treatment/Interventions  ADLs/Self Care Home Management;Aquatic Therapy;Cryotherapy;Electrical Stimulation;Moist Heat;Ultrasound;Therapeutic activities;Therapeutic exercise;Patient/family education;Manual techniques;Passive range of motion;Dry needling    PT Next Visit Plan  STM to posterior RTC musculature, thoracic mobility     PT Home Exercise Plan  see education section    Consulted and Agree with Plan of Care  Patient       Patient will benefit from skilled therapeutic intervention in order to improve the following deficits and impairments:  Increased muscle spasms, Decreased range of motion, Impaired UE functional use, Pain, Decreased strength  Visit Diagnosis: Chronic right shoulder pain  Stiffness of right shoulder, not elsewhere classified     Problem List Patient Active Problem List   Diagnosis Date Noted  . Acute pain of right shoulder 01/06/2018  . Cervical stenosis (uterine cervix) 09/09/2017  . Elevated BP without diagnosis of hypertension 06/06/2017  . Bilateral lower extremity edema 01/30/2016  . Obesity 12/27/2015  . Frequent headaches 10/27/2015  . Environmental allergies 10/27/2015  . DVT (deep venous thrombosis) ([place]) 10/27/2015  . Depression with anxiety 10/23/2015  . GERD (gastroesophageal reflux disease) 10/23/2015  .  Vitamin D deficiency 10/23/2015   Karilyn Cota SPT  Blythe Stanford PT DPT 05/11/2018, 10:54 AM  [facility] [address] [place], [address] Phone: [phone]   Fax:  [phone]  Name: Sharika Mosquera MRN: [id number] Date of Birth: Mar 22, 1951

## 2018-05-13 ENCOUNTER — Ambulatory Visit: Payer: PPO

## 2018-05-13 DIAGNOSIS — M25511 Pain in right shoulder: Secondary | ICD-10-CM | POA: Diagnosis not present

## 2018-05-13 DIAGNOSIS — G8929 Other chronic pain: Secondary | ICD-10-CM

## 2018-05-13 DIAGNOSIS — M25611 Stiffness of right shoulder, not elsewhere classified: Secondary | ICD-10-CM

## 2018-05-13 NOTE — Therapy (Signed)
[facility] [address], [place], [address] Phone: [phone]   Fax:  [phone]  Physical Therapy Treatment  Patient Details  Name: Michele Long MRN: [id number] Date of Birth: 1950/11/30 No data recorded  Encounter Date: 05/13/2018  PT End of Session - 05/13/18 1356    Visit Number  26    Number of Visits  33    Date for PT Re-Evaluation  06/01/18    PT Start Time  1300    PT Stop Time  1345    PT Time Calculation (min)  45 min    Activity Tolerance  Patient tolerated treatment well    Behavior During Therapy  [facility] for tasks assessed/performed       Past Medical History:  Diagnosis Date  . Allergy   . Depression   . DVT (deep venous thrombosis) ([place])   . Edema   . Frequent headaches   . Gastric ulcer 10/27/2015  . GERD (gastroesophageal reflux disease)   . HTN (hypertension)   . Microalbuminuria   . Migraine   . Monocular diplopia of right eye 10/22/2016  . Shingles   . Symptomatic menopausal or female climacteric states   . UTI (lower urinary tract infection)     Past Surgical History:  Procedure Laterality Date  . AUGMENTATION MAMMAPLASTY Bilateral 1983  . BREAST ENHANCEMENT SURGERY  1983  . NASAL SEPTUM SURGERY  2000    There were no vitals filed for this visit.  Subjective Assessment - 05/13/18 1354    Subjective  Pt reports no pain except at night. Pt reports that she is cautious with use of arm but is able to use it functionally for all attempted tasks.    Limitations  House hold activities;Lifting    Patient Stated Goals  get back to everyday tasks, be able to do yardwork and reach up in cabinents    Currently in Pain?  No/denies       TREATMENT  Manual Therapy Grade III Inferior and posterior glenohumeral mobilizations to improve joint mobility, decrease pain, and improve AROM/PROM, patient in supine x8 min  Scapular mobilizations (superior, inferior, upward rotation,  downward rotation) to increase mobility of scapula throughout shoulder AROM and decrease muscle spasms and pain x7 min total   Therapeutic Exercises Sidelying ER with 1 and 2 lb - x 20  RUE ranger flexion at the wall - x 20  RUE ranger abduction at the wall - x 20  B shoulder flexion and R shoulder abduction with yellow physioball at the wall - x20 R shoulder flexion and abduction at the wall with pillow case -- x 10   Patient demonstrates increased fatigue at the end of the session  PT Education - 05/13/18 1355    Education Details  Pt educated on form and technique with exercises. Pt educated on HEP.           PT Long Term Goals - 03/12/18 1650      PT LONG TERM GOAL #1   Title  Pt will be independent and compliant with HEP in order to maintain gains made in PT and return to prior level of funciton.    Baseline  Pt dependent on proper form and technique for HEP exercises 03/12/18: min cueing required for proper form    Time  4    Period  Weeks    Status  On-going      PT LONG TERM GOAL #2  Title  Pt will report worst pain in past week less than 2/10 in order to return to prior level of function    Baseline  4/10; 03/12/18: 3/10     Time  6    Period  Weeks    Status  On-going      PT LONG TERM GOAL #3   Title  Pt will demonstrate full, pain free, shoulder ROM in order to reach into cabinets, perform houshold activities and return to prior level of function.    Baseline  flex 100 deg, ABD 75 deg, ER 25, IR 15; 03/12/18: flexion 125, ABD 90 ER/IR deffered to next visit     Time  6    Period  Weeks    Status  On-going      PT LONG TERM GOAL #4   Title  Pt will improve FOTO score from 49 to 62 in order to demonstrate an improvement in function.    Baseline  current FOTO: 49; 03/12/18: deferred to next visit    Time  6    Period  Weeks    Status  On-going            Plan - 05/13/18 1356    Clinical Impression Statement  Pt is progressing towards pain management  and ROM goals. Pt presented again with no pain and little to no pain since prior visit. Pt continues to demo weakness in ER, abudction and flexion. Pt demo limited ROM in abduction and ER. PT will continue to use manual therapy, therapeutic exercise and ROM to return pt to prior level of function.     Rehab Potential  Good    Clinical Impairments Affecting Rehab Potential  +motivated    PT Frequency  2x / week    PT Duration  6 weeks    PT Treatment/Interventions  ADLs/Self Care Home Management;Aquatic Therapy;Cryotherapy;Electrical Stimulation;Moist Heat;Ultrasound;Therapeutic activities;Therapeutic exercise;Patient/family education;Manual techniques;Passive range of motion;Dry needling    PT Next Visit Plan  STM to posterior RTC musculature, thoracic mobility     PT Home Exercise Plan  see education section    Consulted and Agree with Plan of Care  Patient       Patient will benefit from skilled therapeutic intervention in order to improve the following deficits and impairments:  Increased muscle spasms, Decreased range of motion, Impaired UE functional use, Pain, Decreased strength  Visit Diagnosis: Chronic right shoulder pain  Stiffness of right shoulder, not elsewhere classified     Problem List Patient Active Problem List   Diagnosis Date Noted  . Acute pain of right shoulder 01/06/2018  . Cervical stenosis (uterine cervix) 09/09/2017  . Elevated BP without diagnosis of hypertension 06/06/2017  . Bilateral lower extremity edema 01/30/2016  . Obesity 12/27/2015  . Frequent headaches 10/27/2015  . Environmental allergies 10/27/2015  . DVT (deep venous thrombosis) ([place]) 10/27/2015  . Depression with anxiety 10/23/2015  . GERD (gastroesophageal reflux disease) 10/23/2015  . Vitamin D deficiency 10/23/2015   Karilyn Cota, SPT   Blythe Stanford, PT DPT 05/13/2018, 2:00 PM  [facility] [address] [place], [address] Phone: [phone]   Fax:  [phone]  Name: Georgena Weisheit MRN: [id number] Date of Birth: [birth year]

## 2018-05-15 ENCOUNTER — Encounter: Payer: Self-pay | Admitting: Family Medicine

## 2018-05-15 NOTE — Telephone Encounter (Signed)
Please advise, Immunization has been added to chart

## 2018-05-18 ENCOUNTER — Ambulatory Visit: Payer: PPO

## 2018-05-18 DIAGNOSIS — M25511 Pain in right shoulder: Secondary | ICD-10-CM | POA: Diagnosis not present

## 2018-05-18 DIAGNOSIS — M25611 Stiffness of right shoulder, not elsewhere classified: Secondary | ICD-10-CM

## 2018-05-18 DIAGNOSIS — G8929 Other chronic pain: Secondary | ICD-10-CM

## 2018-05-18 NOTE — Therapy (Signed)
[facility] [address] [place], [address] Phone: [phone]   Fax:  [phone]  Physical Therapy Treatment  Patient Details  Name: Michele Long MRN: [id number] Date of Birth: [birth year] No data recorded  Encounter Date: 05/18/2018  PT End of Session - 05/18/18 1259    Visit Number  27    Number of Visits  33    Date for PT Re-Evaluation  06/01/18    PT Start Time  1117    PT Stop Time  1200    PT Time Calculation (min)  43 min    Activity Tolerance  Patient tolerated treatment well    Behavior During Therapy  [facility] for tasks assessed/performed       Past Medical History:  Diagnosis Date  . Allergy   . Depression   . DVT (deep venous thrombosis) ([place])   . Edema   . Frequent headaches   . Gastric ulcer 10/27/2015  . GERD (gastroesophageal reflux disease)   . HTN (hypertension)   . Microalbuminuria   . Migraine   . Monocular diplopia of right eye 10/22/2016  . Shingles   . Symptomatic menopausal or female climacteric states   . UTI (lower urinary tract infection)     Past Surgical History:  Procedure Laterality Date  . AUGMENTATION MAMMAPLASTY Bilateral 1983  . BREAST ENHANCEMENT SURGERY  1983  . NASAL SEPTUM SURGERY  2000    There were no vitals filed for this visit.  Subjective Assessment - 05/18/18 1257    Subjective  Patient reports her pain has been improveing overall however continues to have difficulty with raising her arm and moving it to end range.     Limitations  House hold activities;Lifting    Patient Stated Goals  get back to everyday tasks, be able to do yardwork and reach up in cabinents    Currently in Pain?  No/denies        TREATMENT  Manual Therapy Grade III Inferior and posterior glenohumeral mobilizations to improve joint mobility, decrease pain, and improve AROM/PROM, patient in supine x12 min with wedge under affected scapula  STM performed to the pec major  with patient positioned in prone to decrease increased pain and spasms    Therapeutic Exercises Standing shoulder ER with shoulder abducted to 90 degrees on the R side -- x 12 Standing scapular rows at [place] -- x 35 12# Standing straight arm push downs at [place] -- x 20 12# Pec stretch in the corner -- x 20 with 3 sec holds  Shoulder flexion B with yellow physioball -- x20  Patient demonstrates increased fatigue at the end of the session    PT Education - 05/18/18 1258    Education Details  Added Pec stretch to HEP    Person(s) Educated  Patient    Methods  Explanation;Demonstration    Comprehension  Verbalized understanding;Returned demonstration          PT Long Term Goals - 03/12/18 1650      PT LONG TERM GOAL #1   Title  Pt will be independent and compliant with HEP in order to maintain gains made in PT and return to prior level of funciton.    Baseline  Pt dependent on proper form and technique for HEP exercises 03/12/18: min cueing required for proper form    Time  4    Period  Weeks    Status  On-going      PT  LONG TERM GOAL #2   Title  Pt will report worst pain in past week less than 2/10 in order to return to prior level of function    Baseline  4/10; 03/12/18: 3/10     Time  6    Period  Weeks    Status  On-going      PT LONG TERM GOAL #3   Title  Pt will demonstrate full, pain free, shoulder ROM in order to reach into cabinets, perform houshold activities and return to prior level of function.    Baseline  flex 100 deg, ABD 75 deg, ER 25, IR 15; 03/12/18: flexion 125, ABD 90 ER/IR deffered to next visit     Time  6    Period  Weeks    Status  On-going      PT LONG TERM GOAL #4   Title  Pt will improve FOTO score from 49 to 62 in order to demonstrate an improvement in function.    Baseline  current FOTO: 49; 03/12/18: deferred to next visit    Time  6    Period  Weeks    Status  On-going            Plan - 05/18/18 1259    Clinical Impression  Statement  Patient is overall improving in all shoulder measurements but most notably with shoulder ER and flexion. She continues to improve in the aforementioned directions however continues to demosntrate some limitations. Patient demonstrates increased pain at end range most likely due to muscular guarding and patient will benefit from further skilled therapy to return to prior level of function.     Rehab Potential  Good    Clinical Impairments Affecting Rehab Potential  +motivated    PT Frequency  2x / week    PT Duration  6 weeks    PT Treatment/Interventions  ADLs/Self Care Home Management;Aquatic Therapy;Cryotherapy;Electrical Stimulation;Moist Heat;Ultrasound;Therapeutic activities;Therapeutic exercise;Patient/family education;Manual techniques;Passive range of motion;Dry needling    PT Next Visit Plan  STM to posterior RTC musculature, thoracic mobility     PT Home Exercise Plan  see education section    Consulted and Agree with Plan of Care  Patient       Patient will benefit from skilled therapeutic intervention in order to improve the following deficits and impairments:  Increased muscle spasms, Decreased range of motion, Impaired UE functional use, Pain, Decreased strength  Visit Diagnosis: Stiffness of right shoulder, not elsewhere classified  Chronic right shoulder pain     Problem List Patient Active Problem List   Diagnosis Date Noted  . Acute pain of right shoulder 01/06/2018  . Cervical stenosis (uterine cervix) 09/09/2017  . Elevated BP without diagnosis of hypertension 06/06/2017  . Bilateral lower extremity edema 01/30/2016  . Obesity 12/27/2015  . Frequent headaches 10/27/2015  . Environmental allergies 10/27/2015  . DVT (deep venous thrombosis) ([place]) 10/27/2015  . Depression with anxiety 10/23/2015  . GERD (gastroesophageal reflux disease) 10/23/2015  . Vitamin D deficiency 10/23/2015    Blythe Stanford, PT DPT 05/18/2018, 1:01 PM  [facility] [address], [place], [address] Phone: [phone]   Fax:  [phone]  Name: Michele Long MRN: [id number] Date of Birth: 02-20-1951

## 2018-05-20 ENCOUNTER — Ambulatory Visit: Payer: PPO

## 2018-05-20 DIAGNOSIS — G8929 Other chronic pain: Secondary | ICD-10-CM

## 2018-05-20 DIAGNOSIS — M25511 Pain in right shoulder: Secondary | ICD-10-CM | POA: Diagnosis not present

## 2018-05-20 DIAGNOSIS — M25611 Stiffness of right shoulder, not elsewhere classified: Secondary | ICD-10-CM

## 2018-05-20 NOTE — Therapy (Signed)
[facility] [address] [place], [address] Phone: [phone]   Fax:  [phone]  Physical Therapy Treatment  Patient Details  Name: Michele Long MRN: [id number] Date of Birth: 03-18-1951 No data recorded  Encounter Date: 05/20/2018  PT End of Session - 05/20/18 1509    Visit Number  28    Number of Visits  33    Date for PT Re-Evaluation  06/01/18    PT Start Time  1433    PT Stop Time  1515    PT Time Calculation (min)  42 min    Activity Tolerance  Patient tolerated treatment well    Behavior During Therapy  [facility] for tasks assessed/performed       Past Medical History:  Diagnosis Date  . Allergy   . Depression   . DVT (deep venous thrombosis) ([place])   . Edema   . Frequent headaches   . Gastric ulcer 10/27/2015  . GERD (gastroesophageal reflux disease)   . HTN (hypertension)   . Microalbuminuria   . Migraine   . Monocular diplopia of right eye 10/22/2016  . Shingles   . Symptomatic menopausal or female climacteric states   . UTI (lower urinary tract infection)     Past Surgical History:  Procedure Laterality Date  . AUGMENTATION MAMMAPLASTY Bilateral 1983  . BREAST ENHANCEMENT SURGERY  1983  . NASAL SEPTUM SURGERY  2000    There were no vitals filed for this visit.  Subjective Assessment - 05/20/18 1507    Subjective  Patient reports no major changes since the previous session. Paitent reports she has been perofrming her HEP    Limitations  House hold activities;Lifting    Patient Stated Goals  get back to everyday tasks, be able to do yardwork and reach up in cabinents    Currently in Pain?  No/denies         TREATMENT  Manual Therapy  Grade III Inferior and posterior glenohumeral mobilizations to improve joint mobility, decrease pain, and improve AROM/PROM, patient in supine x15 min    STM performed to the pec major with patient positioned in prone to decrease increased pain  and spasms      Therapeutic Exercises Standing straight arm push downs at [place] -- x 20 15# Pec stretch in the corner -- x 20 with 3 sec holds  Shoulder flexion B with yellow physioball -- x20 Overhead ball taps - x 20 with 1kg ball  Ball circles on the wall cw/ccw - x 30 sec B  Standing shoulder ER with shoulder abducted to 90 degrees on the R side with arm behind head -- x 12   Patient demonstrates increased fatigue at the end of the session    PT Education - 05/20/18 1509    Education Details  form/technique with exercise    Person(s) Educated  Patient    Methods  Explanation;Demonstration    Comprehension  Verbalized understanding;Returned demonstration          PT Long Term Goals - 03/12/18 1650      PT LONG TERM GOAL #1   Title  Pt will be independent and compliant with HEP in order to maintain gains made in PT and return to prior level of funciton.    Baseline  Pt dependent on proper form and technique for HEP exercises 03/12/18: min cueing required for proper form    Time  4    Period  Weeks  Status  On-going      PT LONG TERM GOAL #2   Title  Pt will report worst pain in past week less than 2/10 in order to return to prior level of function    Baseline  4/10; 03/12/18: 3/10     Time  6    Period  Weeks    Status  On-going      PT LONG TERM GOAL #3   Title  Pt will demonstrate full, pain free, shoulder ROM in order to reach into cabinets, perform houshold activities and return to prior level of function.    Baseline  flex 100 deg, ABD 75 deg, ER 25, IR 15; 03/12/18: flexion 125, ABD 90 ER/IR deffered to next visit     Time  6    Period  Weeks    Status  On-going      PT LONG TERM GOAL #4   Title  Pt will improve FOTO score from 49 to 62 in order to demonstrate an improvement in function.    Baseline  current FOTO: 49; 03/12/18: deferred to next visit    Time  6    Period  Weeks    Status  On-going            Plan - 05/20/18 1511    Clinical  Impression Statement  Patient demosntrates improvement with performing overhead shoulder motion after performing manual therapy. Patient demosntrtaes significant muscle spasms along the pec major most likely from guarding for shoulder motion. Patient continues to have increased pain with end range ER, flexion, abduction. Patient will benefit from further skilled therapy to return to prior level of function.      Rehab Potential  Good    Clinical Impairments Affecting Rehab Potential  +motivated    PT Frequency  2x / week    PT Duration  6 weeks    PT Treatment/Interventions  ADLs/Self Care Home Management;Aquatic Therapy;Cryotherapy;Electrical Stimulation;Moist Heat;Ultrasound;Therapeutic activities;Therapeutic exercise;Patient/family education;Manual techniques;Passive range of motion;Dry needling    PT Next Visit Plan  STM to posterior RTC musculature, thoracic mobility     PT Home Exercise Plan  see education section    Consulted and Agree with Plan of Care  Patient       Patient will benefit from skilled therapeutic intervention in order to improve the following deficits and impairments:  Increased muscle spasms, Decreased range of motion, Impaired UE functional use, Pain, Decreased strength  Visit Diagnosis: Stiffness of right shoulder, not elsewhere classified  Chronic right shoulder pain     Problem List Patient Active Problem List   Diagnosis Date Noted  . Acute pain of right shoulder 01/06/2018  . Cervical stenosis (uterine cervix) 09/09/2017  . Elevated BP without diagnosis of hypertension 06/06/2017  . Bilateral lower extremity edema 01/30/2016  . Obesity 12/27/2015  . Frequent headaches 10/27/2015  . Environmental allergies 10/27/2015  . DVT (deep venous thrombosis) ([place]) 10/27/2015  . Depression with anxiety 10/23/2015  . GERD (gastroesophageal reflux disease) 10/23/2015  . Vitamin D deficiency 10/23/2015    Blythe Stanford, PT DPT 05/20/2018, 3:16 PM  [facility] [address], [place], [address] Phone: [phone]   Fax:  [phone]  Name: Michele Long MRN: [id number] Date of Birth: June 18, 1951

## 2018-05-26 ENCOUNTER — Ambulatory Visit: Payer: PPO

## 2018-05-26 DIAGNOSIS — G8929 Other chronic pain: Secondary | ICD-10-CM

## 2018-05-26 DIAGNOSIS — M25511 Pain in right shoulder: Secondary | ICD-10-CM | POA: Diagnosis not present

## 2018-05-26 DIAGNOSIS — M25611 Stiffness of right shoulder, not elsewhere classified: Secondary | ICD-10-CM

## 2018-05-26 NOTE — Therapy (Signed)
[place] [address] [place], [address] Phone: [phone]   Fax:  [phone]  Physical Therapy Treatment  Patient Details  Name: Michele Long MRN: [id number] Date of Birth: 03-03-1951 No data recorded  Encounter Date: 05/26/2018  PT End of Session - 05/26/18 1555    Visit Number  29    Number of Visits  33    Date for PT Re-Evaluation  06/01/18    PT Start Time  8299    PT Stop Time  1600    PT Time Calculation (min)  45 min    Activity Tolerance  Patient tolerated treatment well    Behavior During Therapy  [facility] for tasks assessed/performed       Past Medical History:  Diagnosis Date  . Allergy   . Depression   . DVT (deep venous thrombosis) ([place])   . Edema   . Frequent headaches   . Gastric ulcer 10/27/2015  . GERD (gastroesophageal reflux disease)   . HTN (hypertension)   . Microalbuminuria   . Migraine   . Monocular diplopia of right eye 10/22/2016  . Shingles   . Symptomatic menopausal or female climacteric states   . UTI (lower urinary tract infection)     Past Surgical History:  Procedure Laterality Date  . AUGMENTATION MAMMAPLASTY Bilateral 1983  . BREAST ENHANCEMENT SURGERY  1983  . NASAL SEPTUM SURGERY  2000    There were no vitals filed for this visit.  Subjective Assessment - 05/26/18 1552    Subjective  Patient reports the shoulder is feeling "okay" today. Patient reports she has been intermittently performing her HEP.     Limitations  House hold activities;Lifting    Patient Stated Goals  get back to everyday tasks, be able to do yardwork and reach up in cabinents    Currently in Pain?  No/denies       TREATMENT  Manual Therapy  Grade III Inferior and posterior glenohumeral mobilizations to improve joint mobility, decrease pain, and improve AROM/PROM, patient in supine x15 min    STM performed to the pec major with patient positioned in prone to decrease increased  pain and spasms      Therapeutic Exercises Pec stretch in the corner -- x 20 with 3 sec holds  Shoulder flexion B with yellow physioball -- x20 Straight arm shoulder extension - x 20  Standing shoulder ER against wall - x 20  Standing shoulder ER against RTB - x 20  Overhead ball taps - x 20 with 1kg ball  Shoulder flexion with stretching into shoulder ER on the R side - x 20    Patient demonstrates increased fatigue at the end of the session    PT Education - 05/26/18 1554    Education Details  Standing shoulder ER against wall     Person(s) Educated  Patient    Methods  Explanation;Demonstration    Comprehension  Verbalized understanding;Returned demonstration          PT Long Term Goals - 03/12/18 1650      PT LONG TERM GOAL #1   Title  Pt will be independent and compliant with HEP in order to maintain gains made in PT and return to prior level of funciton.    Baseline  Pt dependent on proper form and technique for HEP exercises 03/12/18: min cueing required for proper form    Time  4    Period  Weeks  Status  On-going      PT LONG TERM GOAL #2   Title  Pt will report worst pain in past week less than 2/10 in order to return to prior level of function    Baseline  4/10; 03/12/18: 3/10     Time  6    Period  Weeks    Status  On-going      PT LONG TERM GOAL #3   Title  Pt will demonstrate full, pain free, shoulder ROM in order to reach into cabinets, perform houshold activities and return to prior level of function.    Baseline  flex 100 deg, ABD 75 deg, ER 25, IR 15; 03/12/18: flexion 125, ABD 90 ER/IR deffered to next visit     Time  6    Period  Weeks    Status  On-going      PT LONG TERM GOAL #4   Title  Pt will improve FOTO score from 49 to 62 in order to demonstrate an improvement in function.    Baseline  current FOTO: 49; 03/12/18: deferred to next visit    Time  6    Period  Weeks    Status  On-going            Plan - 05/26/18 1604    Clinical  Impression Statement  Patient demonstrates ability to touch the top of her head without increase in pain after performing manual therapy indicating improvement with tissue elasticity. Although patient is improving, she conitnues to have increased shoulder ER and flexion AROM/PROM limitations and will benefit from further skilled therapy to return to prior level of function.     Rehab Potential  Good    Clinical Impairments Affecting Rehab Potential  +motivated    PT Frequency  2x / week    PT Duration  6 weeks    PT Treatment/Interventions  ADLs/Self Care Home Management;Aquatic Therapy;Cryotherapy;Electrical Stimulation;Moist Heat;Ultrasound;Therapeutic activities;Therapeutic exercise;Patient/family education;Manual techniques;Passive range of motion;Dry needling    PT Next Visit Plan  STM to posterior RTC musculature, thoracic mobility     PT Home Exercise Plan  see education section    Consulted and Agree with Plan of Care  Patient       Patient will benefit from skilled therapeutic intervention in order to improve the following deficits and impairments:  Increased muscle spasms, Decreased range of motion, Impaired UE functional use, Pain, Decreased strength  Visit Diagnosis: Stiffness of right shoulder, not elsewhere classified  Chronic right shoulder pain     Problem List Patient Active Problem List   Diagnosis Date Noted  . Acute pain of right shoulder 01/06/2018  . Cervical stenosis (uterine cervix) 09/09/2017  . Elevated BP without diagnosis of hypertension 06/06/2017  . Bilateral lower extremity edema 01/30/2016  . Obesity 12/27/2015  . Frequent headaches 10/27/2015  . Environmental allergies 10/27/2015  . DVT (deep venous thrombosis) ([place]) 10/27/2015  . Depression with anxiety 10/23/2015  . GERD (gastroesophageal reflux disease) 10/23/2015  . Vitamin D deficiency 10/23/2015    Blythe Stanford, PT DPT 05/26/2018, 4:11 PM  [place]  [facility] [address], [place], [address] Phone: [phone]   Fax:  [phone]  Name: Michele Long MRN: [id number] Date of Birth: 1950-09-23

## 2018-05-28 ENCOUNTER — Ambulatory Visit: Payer: PPO

## 2018-05-28 DIAGNOSIS — M25611 Stiffness of right shoulder, not elsewhere classified: Secondary | ICD-10-CM

## 2018-05-28 DIAGNOSIS — M25511 Pain in right shoulder: Secondary | ICD-10-CM | POA: Diagnosis not present

## 2018-05-28 DIAGNOSIS — G8929 Other chronic pain: Secondary | ICD-10-CM

## 2018-05-28 NOTE — Therapy (Signed)
[place] [facility] [address] [place], [address] Phone: [phone]   Fax:  [phone]  Physical Therapy Treatment  Patient Details  Name: Michele Long MRN: [id number] Date of Birth: Feb 29, 1951 No data recorded  Encounter Date: 05/28/2018  PT End of Session - 05/28/18 1542    Visit Number  30    Number of Visits  33    Date for PT Re-Evaluation  06/01/18    PT Start Time  6389    PT Stop Time  1600    PT Time Calculation (min)  45 min    Activity Tolerance  Patient tolerated treatment well    Behavior During Therapy  [facility] for tasks assessed/performed       Past Medical History:  Diagnosis Date  . Allergy   . Depression   . DVT (deep venous thrombosis) ([place])   . Edema   . Frequent headaches   . Gastric ulcer 10/27/2015  . GERD (gastroesophageal reflux disease)   . HTN (hypertension)   . Microalbuminuria   . Migraine   . Monocular diplopia of right eye 10/22/2016  . Shingles   . Symptomatic menopausal or female climacteric states   . UTI (lower urinary tract infection)     Past Surgical History:  Procedure Laterality Date  . AUGMENTATION MAMMAPLASTY Bilateral 1983  . BREAST ENHANCEMENT SURGERY  1983  . NASAL SEPTUM SURGERY  2000    There were no vitals filed for this visit.  Subjective Assessment - 05/28/18 1536    Subjective  Patient states improvement today compared to the previous session and reports she has been performing her HEP.    Limitations  House hold activities;Lifting    Patient Stated Goals  get back to everyday tasks, be able to do yardwork and reach up in cabinents    Currently in Pain?  No/denies       TREATMENT  Manual Therapy  Grade III Inferior and posterior glenohumeral mobilizations to improve joint mobility, decrease pain, and improve AROM/PROM, patient in supine x15 min    STM performed to the pec major with patient positioned in prone to decrease increased pain and  spasms      Therapeutic Exercises Pec stretch in the corner -- x 20 with 3 sec holds  Shoulder flexion B with yellow physioball -- x20 Shoulder adduction with GTB - x 20  Behind the head with shoulder horizontal adduction/abduction - x 10  Serratus punches with RTB - x 20  Shoulder circles with foot on top of GTB - x5 cw/cww performed 5 times Shoulder flexion with GTB with foot on band - x10  Shoulder ER in standing - x 20    Patient demonstrates increased fatigue at the end of the session   PT Education - 05/28/18 1540    Education Details  form/Technique with exercise    Person(s) Educated  Patient    Methods  Explanation;Demonstration    Comprehension  Verbalized understanding;Returned demonstration          PT Long Term Goals - 03/12/18 1650      PT LONG TERM GOAL #1   Title  Pt will be independent and compliant with HEP in order to maintain gains made in PT and return to prior level of funciton.    Baseline  Pt dependent on proper form and technique for HEP exercises 03/12/18: min cueing required for proper form    Time  4    Period  Weeks    Status  On-going      PT LONG TERM GOAL #2   Title  Pt will report worst pain in past week less than 2/10 in order to return to prior level of function    Baseline  4/10; 03/12/18: 3/10     Time  6    Period  Weeks    Status  On-going      PT LONG TERM GOAL #3   Title  Pt will demonstrate full, pain free, shoulder ROM in order to reach into cabinets, perform houshold activities and return to prior level of function.    Baseline  flex 100 deg, ABD 75 deg, ER 25, IR 15; 03/12/18: flexion 125, ABD 90 ER/IR deffered to next visit     Time  6    Period  Weeks    Status  On-going      PT LONG TERM GOAL #4   Title  Pt will improve FOTO score from 49 to 62 in order to demonstrate an improvement in function.    Baseline  current FOTO: 49; 03/12/18: deferred to next visit    Time  6    Period  Weeks    Status  On-going             Plan - 05/28/18 1552    Clinical Impression Statement  Patient demonstrates improvement with shoulder ER and flexion most notably after performing mobility exercises and manual therapy indicating functional carryover between sessions. Patient demonstrates improvement overall however conitnues to have increased pain with end range shoulder motions, most notably ER and flexion. Patient will benefit from further skilled therapy to return to prior level of function.     Rehab Potential  Good    Clinical Impairments Affecting Rehab Potential  +motivated    PT Frequency  2x / week    PT Duration  6 weeks    PT Treatment/Interventions  ADLs/Self Care Home Management;Aquatic Therapy;Cryotherapy;Electrical Stimulation;Moist Heat;Ultrasound;Therapeutic activities;Therapeutic exercise;Patient/family education;Manual techniques;Passive range of motion;Dry needling    PT Next Visit Plan  STM to posterior RTC musculature, thoracic mobility     PT Home Exercise Plan  see education section    Consulted and Agree with Plan of Care  Patient       Patient will benefit from skilled therapeutic intervention in order to improve the following deficits and impairments:  Increased muscle spasms, Decreased range of motion, Impaired UE functional use, Pain, Decreased strength  Visit Diagnosis: Chronic right shoulder pain  Stiffness of right shoulder, not elsewhere classified     Problem List Patient Active Problem List   Diagnosis Date Noted  . Acute pain of right shoulder 01/06/2018  . Cervical stenosis (uterine cervix) 09/09/2017  . Elevated BP without diagnosis of hypertension 06/06/2017  . Bilateral lower extremity edema 01/30/2016  . Obesity 12/27/2015  . Frequent headaches 10/27/2015  . Environmental allergies 10/27/2015  . DVT (deep venous thrombosis) ([place]) 10/27/2015  . Depression with anxiety 10/23/2015  . GERD (gastroesophageal reflux disease) 10/23/2015  . Vitamin D deficiency  10/23/2015    Blythe Stanford, PT DPT 05/28/2018, 4:01 PM  [place] [facility] [address] [place], [address] Phone: [phone]   Fax:  [phone]  Name: Michele Long MRN: [id number] Date of Birth: [birth year]

## 2018-06-01 ENCOUNTER — Ambulatory Visit: Payer: PPO | Attending: Family Medicine

## 2018-06-01 DIAGNOSIS — M25611 Stiffness of right shoulder, not elsewhere classified: Secondary | ICD-10-CM | POA: Diagnosis not present

## 2018-06-01 DIAGNOSIS — G8929 Other chronic pain: Secondary | ICD-10-CM | POA: Insufficient documentation

## 2018-06-01 DIAGNOSIS — M25511 Pain in right shoulder: Secondary | ICD-10-CM | POA: Insufficient documentation

## 2018-06-01 NOTE — Therapy (Signed)
[place] [facility] [address] [place], [address] Phone: [phone]   Fax:  [phone]  Physical Therapy Treatment  Patient Details  Name: Michele Long MRN: [id number] Date of Birth: 26-Aug-1950 No data recorded  Encounter Date: 06/01/2018  PT End of Session - 06/01/18 1255    Visit Number  31    Number of Visits  33    Date for PT Re-Evaluation  06/01/18    PT Start Time  1115    PT Stop Time  1200    PT Time Calculation (min)  45 min    Activity Tolerance  Patient tolerated treatment well    Behavior During Therapy  [facility] for tasks assessed/performed       Past Medical History:  Diagnosis Date  . Allergy   . Depression   . DVT (deep venous thrombosis) ([place])   . Edema   . Frequent headaches   . Gastric ulcer 10/27/2015  . GERD (gastroesophageal reflux disease)   . HTN (hypertension)   . Microalbuminuria   . Migraine   . Monocular diplopia of right eye 10/22/2016  . Shingles   . Symptomatic menopausal or female climacteric states   . UTI (lower urinary tract infection)     Past Surgical History:  Procedure Laterality Date  . AUGMENTATION MAMMAPLASTY Bilateral 1983  . BREAST ENHANCEMENT SURGERY  1983  . NASAL SEPTUM SURGERY  2000    There were no vitals filed for this visit.  Subjective Assessment - 06/01/18 1254    Subjective  Patient reports no major changes and states she is "doing alright" today. Patient reports she has been improving recently.     Limitations  House hold activities;Lifting    Patient Stated Goals  get back to everyday tasks, be able to do yardwork and reach up in cabinents    Currently in Pain?  No/denies       TREATMENT  Manual Therapy  Grade III Inferior and posterior glenohumeral mobilizations to improve joint mobility, decrease pain, and improve AROM/PROM, patient in supine x15 min    STM performed to the pec major with patient positioned in prone to decrease  increased pain and spasms      Therapeutic Exercises Pec stretch in the corner -- x 20 with 3 sec holds  Shoulder flexion B with yellow physioball -- x20 Shoulder adduction with GTB - x 20  Behind the head with shoulder horizontal adduction/abduction - x 10  Shoulder passes behind back - x 20  Shoulder IR behind back with therapist support - x 20  High Row in sitting - x 20 Shoulder flexion with GTB with foot on band - x10    Patient demonstrates increased fatigue at the end of the session   PT Education - 06/01/18 1254    Education Details  form/technique with exercise    Person(s) Educated  Patient    Methods  Explanation;Demonstration    Comprehension  Verbalized understanding;Returned demonstration          PT Long Term Goals - 03/12/18 1650      PT LONG TERM GOAL #1   Title  Pt will be independent and compliant with HEP in order to maintain gains made in PT and return to prior level of funciton.    Baseline  Pt dependent on proper form and technique for HEP exercises 03/12/18: min cueing required for proper form    Time  4    Period  Weeks    Status  On-going      PT LONG TERM GOAL #2   Title  Pt will report worst pain in past week less than 2/10 in order to return to prior level of function    Baseline  4/10; 03/12/18: 3/10     Time  6    Period  Weeks    Status  On-going      PT LONG TERM GOAL #3   Title  Pt will demonstrate full, pain free, shoulder ROM in order to reach into cabinets, perform houshold activities and return to prior level of function.    Baseline  flex 100 deg, ABD 75 deg, ER 25, IR 15; 03/12/18: flexion 125, ABD 90 ER/IR deffered to next visit     Time  6    Period  Weeks    Status  On-going      PT LONG TERM GOAL #4   Title  Pt will improve FOTO score from 49 to 62 in order to demonstrate an improvement in function.    Baseline  current FOTO: 49; 03/12/18: deferred to next visit    Time  6    Period  Weeks    Status  On-going             Plan - 06/01/18 1255    Clinical Impression Statement  Patient continues to demonstrate improvement with shoulder ER however, continues to have difficulty with performing end range motion most notably with flexion and rotational movements. Patient continues to have difficulty with strengthening throughout end range but is improving overall. Patient will benefit from further skilled therapy to return to prior level of function.     Rehab Potential  Good    Clinical Impairments Affecting Rehab Potential  +motivated    PT Frequency  2x / week    PT Duration  6 weeks    PT Treatment/Interventions  ADLs/Self Care Home Management;Aquatic Therapy;Cryotherapy;Electrical Stimulation;Moist Heat;Ultrasound;Therapeutic activities;Therapeutic exercise;Patient/family education;Manual techniques;Passive range of motion;Dry needling    PT Next Visit Plan  STM to posterior RTC musculature, thoracic mobility     PT Home Exercise Plan  see education section    Consulted and Agree with Plan of Care  Patient       Patient will benefit from skilled therapeutic intervention in order to improve the following deficits and impairments:  Increased muscle spasms, Decreased range of motion, Impaired UE functional use, Pain, Decreased strength  Visit Diagnosis: Chronic right shoulder pain  Stiffness of right shoulder, not elsewhere classified     Problem List Patient Active Problem List   Diagnosis Date Noted  . Acute pain of right shoulder 01/06/2018  . Cervical stenosis (uterine cervix) 09/09/2017  . Elevated BP without diagnosis of hypertension 06/06/2017  . Bilateral lower extremity edema 01/30/2016  . Obesity 12/27/2015  . Frequent headaches 10/27/2015  . Environmental allergies 10/27/2015  . DVT (deep venous thrombosis) ([place]) 10/27/2015  . Depression with anxiety 10/23/2015  . GERD (gastroesophageal reflux disease) 10/23/2015  . Vitamin D deficiency 10/23/2015    Blythe Stanford, PT  DPT 06/01/2018, 12:57 PM  [place] [facility] [address] [place], [address] Phone: [phone]   Fax:  [phone]  Name: Chaylee Ehrsam MRN: [id number] Date of Birth: 1950/11/11

## 2018-06-03 ENCOUNTER — Ambulatory Visit: Payer: PPO

## 2018-06-03 DIAGNOSIS — G8929 Other chronic pain: Secondary | ICD-10-CM

## 2018-06-03 DIAGNOSIS — M25511 Pain in right shoulder: Secondary | ICD-10-CM | POA: Diagnosis not present

## 2018-06-03 DIAGNOSIS — M25611 Stiffness of right shoulder, not elsewhere classified: Secondary | ICD-10-CM

## 2018-06-03 NOTE — Therapy (Signed)
[place] [facility] [address] [place], [address] Phone: [phone]   Fax:  [phone]  Physical Therapy Treatment  Patient Details  Name: Michele Long MRN: [id number] Date of Birth: 1951-07-03 No data recorded  Encounter Date: 06/03/2018  PT End of Session - 06/03/18 1206    Visit Number  32    Number of Visits  42    Date for PT Re-Evaluation  06/01/18    PT Start Time  1115    PT Stop Time  1200    PT Time Calculation (min)  45 min    Activity Tolerance  Patient tolerated treatment well    Behavior During Therapy  [facility] for tasks assessed/performed       Past Medical History:  Diagnosis Date  . Allergy   . Depression   . DVT (deep venous thrombosis) ([place])   . Edema   . Frequent headaches   . Gastric ulcer 10/27/2015  . GERD (gastroesophageal reflux disease)   . HTN (hypertension)   . Microalbuminuria   . Migraine   . Monocular diplopia of right eye 10/22/2016  . Shingles   . Symptomatic menopausal or female climacteric states   . UTI (lower urinary tract infection)     Past Surgical History:  Procedure Laterality Date  . AUGMENTATION MAMMAPLASTY Bilateral 1983  . BREAST ENHANCEMENT SURGERY  1983  . NASAL SEPTUM SURGERY  2000    There were no vitals filed for this visit.  Subjective Assessment - 06/03/18 1204    Subjective  Patient reports she has tasks to do today and would like to perform less exercises to allow for greater performance of functional activities.     Limitations  House hold activities;Lifting    Patient Stated Goals  get back to everyday tasks, be able to do yardwork and reach up in cabinents    Currently in Pain?  No/denies       TREATMENT  Manual Therapy Grade III Inferior and posterior glenohumeral mobilizations to improve joint mobility, decrease pain, and improve AROM/PROM, patient in supine x65min   STM performed to the pec major, lat, and deep shoulder  stabilizers with patient positioned in supine to decrease increased pain and spasms   Therapeutic Exercises Shoulder flexion B with yellow physioball -- x20 Shoulder abduction with yellow physioball -- x 20  Shoulder adduction with GTB - x 20  Shoulder extension in standing with GTB -- x 20  Serratus punches with YTB -- x 20  Patient demonstrates increased fatigue at the end of the session    PT Education - 06/03/18 1205    Education Details  continue to perform exercises at home to maintain improvement of AROM    Person(s) Educated  Patient    Methods  Explanation;Demonstration    Comprehension  Verbalized understanding;Returned demonstration          PT Long Term Goals - 06/03/18 1235      PT LONG TERM GOAL #1   Title  Pt will be independent and compliant with HEP in order to maintain gains made in PT and return to prior level of funciton.    Baseline  Pt dependent on proper form and technique for HEP exercises 03/12/18: min cueing required for proper form; 06/03/2018: performs HEP, requires cueing for progression of exercises    Time  4    Period  Weeks    Status  On-going      PT LONG TERM  GOAL #2   Title  Pt will report worst pain in past week less than 2/10 in order to return to prior level of function    Baseline  4/10; 03/12/18: 3/10; 06/03/2018: 2/10    Time  6    Period  Weeks    Status  On-going      PT LONG TERM GOAL #3   Title  Pt will demonstrate full, pain free, shoulder ROM in order to reach into cabinets, perform houshold activities and return to prior level of function.    Baseline  flex 100 deg, ABD 75 deg, ER 25, IR 15; 03/12/18: flexion 125, ABD 90 ER/IR deffered to next visit ; 06/03/2018: 155 flexion, abduction 120; ER: 60, IR: 55    Time  6    Period  Weeks    Status  On-going      PT LONG TERM GOAL #4   Title  Pt will improve FOTO score from 49 to 62 in order to demonstrate an improvement in function.    Baseline  current FOTO: 49; 03/12/18:  deferred to next visit    Time  6    Period  Weeks    Status  On-going            Plan - 06/03/18 1233    Clinical Impression Statement  Patient is making progress towards long term goals with continuous improvement with shoulder AROM in terms of flexion, abduction and ER/IR. Patient also demonstrates improvement in pain with experiencing increased pain at end range, but not often at rest. Although patient is improving, she continues to demonstrates decreased AROM and strength and will benefit from further skilled therapy to return to prior level of function.     Rehab Potential  Good    Clinical Impairments Affecting Rehab Potential  +motivated    PT Frequency  2x / week    PT Duration  6 weeks    PT Treatment/Interventions  ADLs/Self Care Home Management;Aquatic Therapy;Cryotherapy;Electrical Stimulation;Moist Heat;Ultrasound;Therapeutic activities;Therapeutic exercise;Patient/family education;Manual techniques;Passive range of motion;Dry needling    PT Next Visit Plan  STM to posterior RTC musculature, thoracic mobility     PT Home Exercise Plan  see education section    Consulted and Agree with Plan of Care  Patient       Patient will benefit from skilled therapeutic intervention in order to improve the following deficits and impairments:  Increased muscle spasms, Decreased range of motion, Impaired UE functional use, Pain, Decreased strength  Visit Diagnosis: Chronic right shoulder pain  Stiffness of right shoulder, not elsewhere classified     Problem List Patient Active Problem List   Diagnosis Date Noted  . Acute pain of right shoulder 01/06/2018  . Cervical stenosis (uterine cervix) 09/09/2017  . Elevated BP without diagnosis of hypertension 06/06/2017  . Bilateral lower extremity edema 01/30/2016  . Obesity 12/27/2015  . Frequent headaches 10/27/2015  . Environmental allergies 10/27/2015  . DVT (deep venous thrombosis) ([place]) 10/27/2015  . Depression with  anxiety 10/23/2015  . GERD (gastroesophageal reflux disease) 10/23/2015  . Vitamin D deficiency 10/23/2015    Blythe Stanford, PT DPT 06/03/2018, 12:37 PM  [place] [facility] [address] [place], [address] Phone: [phone]   Fax:  [phone]  Name: Jessaca Philippi MRN: [id number] Date of Birth: 1951/07/27

## 2018-06-03 NOTE — Addendum Note (Signed)
Addended by: Blythe Stanford V on: 06/03/2018 06:30 PM   Modules accepted: Orders

## 2018-06-08 ENCOUNTER — Ambulatory Visit: Payer: PPO

## 2018-06-08 DIAGNOSIS — M25511 Pain in right shoulder: Secondary | ICD-10-CM

## 2018-06-08 DIAGNOSIS — G8929 Other chronic pain: Secondary | ICD-10-CM

## 2018-06-08 DIAGNOSIS — M25611 Stiffness of right shoulder, not elsewhere classified: Secondary | ICD-10-CM

## 2018-06-08 NOTE — Therapy (Signed)
[facility] [address] [place], [address] Phone: [phone]   Fax:  [phone]  Physical Therapy Treatment  Patient Details  Name: Michele Long MRN: [id number] Date of Birth: Sep 16, 1950 No data recorded  Encounter Date: 06/08/2018  PT End of Session - 06/08/18 1726    Visit Number  33    Number of Visits  42    Date for PT Re-Evaluation  07/01/18    PT Start Time  1345    PT Stop Time  1430    PT Time Calculation (min)  45 min    Activity Tolerance  Patient tolerated treatment well    Behavior During Therapy  [facility] for tasks assessed/performed       Past Medical History:  Diagnosis Date  . Allergy   . Depression   . DVT (deep venous thrombosis) ([place])   . Edema   . Frequent headaches   . Gastric ulcer 10/27/2015  . GERD (gastroesophageal reflux disease)   . HTN (hypertension)   . Microalbuminuria   . Migraine   . Monocular diplopia of right eye 10/22/2016  . Shingles   . Symptomatic menopausal or female climacteric states   . UTI (lower urinary tract infection)     Past Surgical History:  Procedure Laterality Date  . AUGMENTATION MAMMAPLASTY Bilateral 1983  . BREAST ENHANCEMENT SURGERY  1983  . NASAL SEPTUM SURGERY  2000    There were no vitals filed for this visit.  Subjective Assessment - 06/08/18 1423    Subjective  Patient rpeorts she is able to raise her arm further overhead and states her shoulder is improving overall.     Limitations  House hold activities;Lifting    Patient Stated Goals  get back to everyday tasks, be able to do yardwork and reach up in cabinents    Currently in Pain?  No/denies        TREATMENT  Manual Therapy  Grade III Inferior and posterior glenohumeral mobilizations to improve joint mobility, decrease pain, and improve AROM/PROM, patient in supine x13 min    STM performed to the pec major, lat, and deep shoulder stabilizers with patient positioned in  supine to decrease increased pain and spasms      Therapeutic Exercises Shoulder flexion B with yellow physioball -- x20 Shoulder scaption with 2# db - x 20  Push up PLUS - x 30  Shoulder rows at OMEGA - x 30 15# Pec stretch in the corner - x 20    Patient demonstrates increased fatigue at the end of the session   PT Education - 06/08/18 1725    Education Details  Neuroscience education    Person(s) Educated  Patient    Methods  Explanation;Demonstration    Comprehension  Verbalized understanding;Returned demonstration          PT Long Term Goals - 06/03/18 1235      PT LONG TERM GOAL #1   Title  Pt will be independent and compliant with HEP in order to maintain gains made in PT and return to prior level of funciton.    Baseline  Pt dependent on proper form and technique for HEP exercises 03/12/18: min cueing required for proper form; 06/03/2018: performs HEP, requires cueing for progression of exercises    Time  4    Period  Weeks    Status  On-going      PT LONG TERM GOAL #2   Title  Pt will  report worst pain in past week less than 2/10 in order to return to prior level of function    Baseline  4/10; 03/12/18: 3/10; 06/03/2018: 2/10    Time  6    Period  Weeks    Status  On-going      PT LONG TERM GOAL #3   Title  Pt will demonstrate full, pain free, shoulder ROM in order to reach into cabinets, perform houshold activities and return to prior level of function.    Baseline  flex 100 deg, ABD 75 deg, ER 25, IR 15; 03/12/18: flexion 125, ABD 90 ER/IR deffered to next visit ; 06/03/2018: 155 flexion, abduction 120; ER: 60, IR: 55    Time  6    Period  Weeks    Status  On-going      PT LONG TERM GOAL #4   Title  Pt will improve FOTO score from 49 to 62 in order to demonstrate an improvement in function.    Baseline  current FOTO: 49; 03/12/18: deferred to next visit    Time  6    Period  Weeks    Status  On-going            Plan - 06/08/18 1726    Clinical  Impression Statement  Patient demonstrates improvement in strength overall with ability to perform overhead shoulder flexion against weighted resistance. Although her strength is improving, she continues to have weakness into end ranges of motion and demonstrates poor scapular mobility and coordination. Patient will benefit from further skilled therapy to return to prior level of function.     Rehab Potential  Good    Clinical Impairments Affecting Rehab Potential  +motivated    PT Frequency  2x / week    PT Duration  6 weeks    PT Treatment/Interventions  ADLs/Self Care Home Management;Aquatic Therapy;Cryotherapy;Electrical Stimulation;Moist Heat;Ultrasound;Therapeutic activities;Therapeutic exercise;Patient/family education;Manual techniques;Passive range of motion;Dry needling    PT Next Visit Plan  STM to posterior RTC musculature, thoracic mobility     PT Home Exercise Plan  see education section    Consulted and Agree with Plan of Care  Patient       Patient will benefit from skilled therapeutic intervention in order to improve the following deficits and impairments:  Increased muscle spasms, Decreased range of motion, Impaired UE functional use, Pain, Decreased strength  Visit Diagnosis: Stiffness of right shoulder, not elsewhere classified  Chronic right shoulder pain     Problem List Patient Active Problem List   Diagnosis Date Noted  . Acute pain of right shoulder 01/06/2018  . Cervical stenosis (uterine cervix) 09/09/2017  . Elevated BP without diagnosis of hypertension 06/06/2017  . Bilateral lower extremity edema 01/30/2016  . Obesity 12/27/2015  . Frequent headaches 10/27/2015  . Environmental allergies 10/27/2015  . DVT (deep venous thrombosis) ([place]) 10/27/2015  . Depression with anxiety 10/23/2015  . GERD (gastroesophageal reflux disease) 10/23/2015  . Vitamin D deficiency 10/23/2015    Blythe Stanford, PT DPT 06/08/2018, 5:29 PM  [facility] [address] [place], [address] Phone: [phone]   Fax:  [phone]  Name: Cornell Gaber MRN: [id number] Date of Birth: 1951-03-10

## 2018-06-11 ENCOUNTER — Ambulatory Visit: Payer: PPO

## 2018-06-15 ENCOUNTER — Ambulatory Visit: Payer: PPO

## 2018-06-22 ENCOUNTER — Ambulatory Visit: Payer: PPO

## 2018-06-22 DIAGNOSIS — M25611 Stiffness of right shoulder, not elsewhere classified: Secondary | ICD-10-CM

## 2018-06-22 DIAGNOSIS — M25511 Pain in right shoulder: Secondary | ICD-10-CM | POA: Diagnosis not present

## 2018-06-22 DIAGNOSIS — G8929 Other chronic pain: Secondary | ICD-10-CM

## 2018-06-22 NOTE — Therapy (Signed)
[facility] [address], [place], [address] Phone: [phone]   Fax:  [phone]  Physical Therapy Treatment  Patient Details  Name: Michele Long MRN: [id number] Date of Birth: [birth year] No data recorded  Encounter Date: 06/22/2018  PT End of Session - 06/22/18 1440    Visit Number  34    Number of Visits  42    Date for PT Re-Evaluation  07/01/18    PT Start Time  4401    PT Stop Time  1430    PT Time Calculation (min)  45 min    Activity Tolerance  Patient tolerated treatment well    Behavior During Therapy  [facility] for tasks assessed/performed       Past Medical History:  Diagnosis Date  . Allergy   . Depression   . DVT (deep venous thrombosis) ([place])   . Edema   . Frequent headaches   . Gastric ulcer 10/27/2015  . GERD (gastroesophageal reflux disease)   . HTN (hypertension)   . Microalbuminuria   . Migraine   . Monocular diplopia of right eye 10/22/2016  . Shingles   . Symptomatic menopausal or female climacteric states   . UTI (lower urinary tract infection)     Past Surgical History:  Procedure Laterality Date  . AUGMENTATION MAMMAPLASTY Bilateral 1983  . BREAST ENHANCEMENT SURGERY  1983  . NASAL SEPTUM SURGERY  2000    There were no vitals filed for this visit.  Subjective Assessment - 06/22/18 1438    Subjective  Patient reports she feels minor pain at rest and states she is worried her shoulder is starting to get worse in pain. Patient states she was sick for 5 days and was not able to come to therapy secondary to increase in pain.     Limitations  House hold activities;Lifting    Patient Stated Goals  get back to everyday tasks, be able to do yardwork and reach up in cabinents    Currently in Pain?  Yes    Pain Score  1     Pain Orientation  Right    Pain Descriptors / Indicators  Aching    Pain Type  Chronic pain    Pain Onset  More than a month ago    Pain Frequency   Constant       TREATMENT  Manual Therapy Grade III Inferior and posterior glenohumeral mobilizations to improve joint mobility, decrease pain, and improve AROM/PROM, patient in supine x31min   STM performed to the pec major, lat, and deep shoulder stabilizerswith patient positioned in supineto decrease increased pain and spasms-- x 71min   Therapeutic Exercises Shoulder flexion B with yellow physioball -- x20 Arm circles with foot on GTB -- x 20  Shoulder serratus punches in standing -- x 20 GTB Standing shoulder ER with GTB -- x 20  Neuroscience reeducation: Explained pain using the stepping on the nail metaphor and the brain prioritization of situations based on severity. Patient educated by using of flashcards and was able to return demonstration of materials.   Patient demonstrates increased fatigue at the end of the session    PT Education - 06/22/18 1439    Education Details  Neuroscience; importance of decreasing stressors in life; improving shoulder movement and pain    Person(s) Educated  Patient    Methods  Explanation;Demonstration    Comprehension  Verbalized understanding;Returned demonstration          PT  Long Term Goals - 06/03/18 1235      PT LONG TERM GOAL #1   Title  Pt will be independent and compliant with HEP in order to maintain gains made in PT and return to prior level of funciton.    Baseline  Pt dependent on proper form and technique for HEP exercises 03/12/18: min cueing required for proper form; 06/03/2018: performs HEP, requires cueing for progression of exercises    Time  4    Period  Weeks    Status  On-going      PT LONG TERM GOAL #2   Title  Pt will report worst pain in past week less than 2/10 in order to return to prior level of function    Baseline  4/10; 03/12/18: 3/10; 06/03/2018: 2/10    Time  6    Period  Weeks    Status  On-going      PT LONG TERM GOAL #3   Title  Pt will demonstrate full, pain free, shoulder ROM in  order to reach into cabinets, perform houshold activities and return to prior level of function.    Baseline  flex 100 deg, ABD 75 deg, ER 25, IR 15; 03/12/18: flexion 125, ABD 90 ER/IR deffered to next visit ; 06/03/2018: 155 flexion, abduction 120; ER: 60, IR: 55    Time  6    Period  Weeks    Status  On-going      PT LONG TERM GOAL #4   Title  Pt will improve FOTO score from 49 to 62 in order to demonstrate an improvement in function.    Baseline  current FOTO: 49; 03/12/18: deferred to next visit    Time  6    Period  Weeks    Status  On-going            Plan - 06/22/18 1440    Clinical Impression Statement  Patient demonstrates improvement with pain and spasms after presenting neuroscience information and performing manual therapy indicating improvement in pain. Patient demonstrates overall improvement at end of session but continues to have limitations with shoulder flexion and abduction. Although patient is improving, she continues to have increased pain and will benefit from further skilled therapy to return to prior level of function.     Rehab Potential  Good    Clinical Impairments Affecting Rehab Potential  +motivated    PT Frequency  2x / week    PT Duration  6 weeks    PT Treatment/Interventions  ADLs/Self Care Home Management;Aquatic Therapy;Cryotherapy;Electrical Stimulation;Moist Heat;Ultrasound;Therapeutic activities;Therapeutic exercise;Patient/family education;Manual techniques;Passive range of motion;Dry needling    PT Next Visit Plan  STM to posterior RTC musculature, thoracic mobility     PT Home Exercise Plan  see education section    Consulted and Agree with Plan of Care  Patient       Patient will benefit from skilled therapeutic intervention in order to improve the following deficits and impairments:  Increased muscle spasms, Decreased range of motion, Impaired UE functional use, Pain, Decreased strength  Visit Diagnosis: Stiffness of right shoulder, not  elsewhere classified  Chronic right shoulder pain     Problem List Patient Active Problem List   Diagnosis Date Noted  . Acute pain of right shoulder 01/06/2018  . Cervical stenosis (uterine cervix) 09/09/2017  . Elevated BP without diagnosis of hypertension 06/06/2017  . Bilateral lower extremity edema 01/30/2016  . Obesity 12/27/2015  . Frequent headaches 10/27/2015  . Environmental allergies 10/27/2015  .  DVT (deep venous thrombosis) ([place]) 10/27/2015  . Depression with anxiety 10/23/2015  . GERD (gastroesophageal reflux disease) 10/23/2015  . Vitamin D deficiency 10/23/2015    Blythe Stanford, PT DPT 06/22/2018, 2:44 PM  [facility] [address] [place], [address] Phone: [phone]   Fax:  [phone]  Name: Michele Long MRN: [id number] Date of Birth: 05-06-1951

## 2018-06-24 ENCOUNTER — Ambulatory Visit: Payer: PPO

## 2018-06-24 DIAGNOSIS — M25611 Stiffness of right shoulder, not elsewhere classified: Secondary | ICD-10-CM

## 2018-06-24 DIAGNOSIS — M25511 Pain in right shoulder: Secondary | ICD-10-CM

## 2018-06-24 DIAGNOSIS — G8929 Other chronic pain: Secondary | ICD-10-CM

## 2018-06-24 NOTE — Therapy (Signed)
[facility] [address], [place], [address] Phone: [phone]   Fax:  [phone]  Physical Therapy Treatment  Patient Details  Name: Michele Long MRN: [id number] Date of Birth: 1951/01/05 No data recorded  Encounter Date: 06/24/2018  PT End of Session - 06/24/18 1530    Visit Number  35    Number of Visits  42    Date for PT Re-Evaluation  07/01/18    PT Start Time  1430    PT Stop Time  1515    PT Time Calculation (min)  45 min    Activity Tolerance  Patient tolerated treatment well    Behavior During Therapy  [facility] for tasks assessed/performed       Past Medical History:  Diagnosis Date  . Allergy   . Depression   . DVT (deep venous thrombosis) ([place])   . Edema   . Frequent headaches   . Gastric ulcer 10/27/2015  . GERD (gastroesophageal reflux disease)   . HTN (hypertension)   . Microalbuminuria   . Migraine   . Monocular diplopia of right eye 10/22/2016  . Shingles   . Symptomatic menopausal or female climacteric states   . UTI (lower urinary tract infection)     Past Surgical History:  Procedure Laterality Date  . AUGMENTATION MAMMAPLASTY Bilateral 1983  . BREAST ENHANCEMENT SURGERY  1983  . NASAL SEPTUM SURGERY  2000    There were no vitals filed for this visit.  Subjective Assessment - 06/24/18 1437    Subjective  Patient reports she has not been having a great day becuase of her dog. Patient reports she has worked on goals since the previous visist and has been performing her HEP.     Limitations  House hold activities;Lifting    Patient Stated Goals  get back to everyday tasks, be able to do yardwork and reach up in cabinents    Currently in Pain?  Yes    Pain Score  1     Pain Location  Shoulder    Pain Orientation  Right    Pain Descriptors / Indicators  Aching    Pain Type  Chronic pain    Pain Onset  More than a month ago    Pain Frequency  Constant       TREATMENT   Manual Therapy  Grade III Inferior and posterior glenohumeral mobilizations to improve joint mobility, decrease pain, and improve AROM/PROM, patient in supine x10 min    STM performed to the pec major and deep shoulder stabilizers with patient positioned in supine to decrease increased pain and spasms -- x 80min     Therapeutic Exercises Shoulder flexion B with yellow physioball -- x20 Corner pec stretch in standing - x 20  Standing shoulder ER with YTB -- x 20 Shoulder extension in standing with YTB - x 20    Neuroscience reeducation: Explained pain using the stepping on the nail metaphor and the brain positioning of the brain of situations based on severity. Patient educated by using of flashcards and was able to return demonstration of materials.     Patient demonstrates increased fatigue at the end of the session   PT Education - 06/24/18 1527    Education Details  form/techniqie with exercise; pain education using the grandmother anaology    Person(s) Educated  Patient    Methods  Explanation;Demonstration    Comprehension  Verbalized understanding;Returned demonstration  PT Long Term Goals - 06/03/18 1235      PT LONG TERM GOAL #1   Title  Pt will be independent and compliant with HEP in order to maintain gains made in PT and return to prior level of funciton.    Baseline  Pt dependent on proper form and technique for HEP exercises 03/12/18: min cueing required for proper form; 06/03/2018: performs HEP, requires cueing for progression of exercises    Time  4    Period  Weeks    Status  On-going      PT LONG TERM GOAL #2   Title  Pt will report worst pain in past week less than 2/10 in order to return to prior level of function    Baseline  4/10; 03/12/18: 3/10; 06/03/2018: 2/10    Time  6    Period  Weeks    Status  On-going      PT LONG TERM GOAL #3   Title  Pt will demonstrate full, pain free, shoulder ROM in order to reach into cabinets, perform houshold  activities and return to prior level of function.    Baseline  flex 100 deg, ABD 75 deg, ER 25, IR 15; 03/12/18: flexion 125, ABD 90 ER/IR deffered to next visit ; 06/03/2018: 155 flexion, abduction 120; ER: 60, IR: 55    Time  6    Period  Weeks    Status  On-going      PT LONG TERM GOAL #4   Title  Pt will improve FOTO score from 49 to 62 in order to demonstrate an improvement in function.    Baseline  current FOTO: 49; 03/12/18: deferred to next visit    Time  6    Period  Weeks    Status  On-going            Plan - 06/24/18 1531    Clinical Impression Statement  Patient demonstrates good understanding of exercises and pain education with ability to recite informaiton from the previous session. Continued to perform exercises in which the shoulder was foucsed on performing shoulder flexion and ER. Patient will benefit from further skillef therapy to return to prior level of function.      Rehab Potential  Good    Clinical Impairments Affecting Rehab Potential  +motivated    PT Frequency  2x / week    PT Duration  6 weeks    PT Treatment/Interventions  ADLs/Self Care Home Management;Aquatic Therapy;Cryotherapy;Electrical Stimulation;Moist Heat;Ultrasound;Therapeutic activities;Therapeutic exercise;Patient/family education;Manual techniques;Passive range of motion;Dry needling    PT Next Visit Plan  STM to posterior RTC musculature, thoracic mobility     PT Home Exercise Plan  see education section    Consulted and Agree with Plan of Care  Patient       Patient will benefit from skilled therapeutic intervention in order to improve the following deficits and impairments:  Increased muscle spasms, Decreased range of motion, Impaired UE functional use, Pain, Decreased strength  Visit Diagnosis: Stiffness of right shoulder, not elsewhere classified  Chronic right shoulder pain     Problem List Patient Active Problem List   Diagnosis Date Noted  . Acute pain of right shoulder  01/06/2018  . Cervical stenosis (uterine cervix) 09/09/2017  . Elevated BP without diagnosis of hypertension 06/06/2017  . Bilateral lower extremity edema 01/30/2016  . Obesity 12/27/2015  . Frequent headaches 10/27/2015  . Environmental allergies 10/27/2015  . DVT (deep venous thrombosis) ([place]) 10/27/2015  . Depression with  anxiety 10/23/2015  . GERD (gastroesophageal reflux disease) 10/23/2015  . Vitamin D deficiency 10/23/2015    Blythe Stanford, PT DPT 06/24/2018, 3:40 PM  [facility] [address], [place], [address] Phone: [phone]   Fax:  [phone]  Name: Michele Long MRN: [id number] Date of Birth: 1950/10/16

## 2018-07-01 ENCOUNTER — Encounter: Payer: Self-pay | Admitting: Family Medicine

## 2018-07-01 ENCOUNTER — Ambulatory Visit: Payer: PPO | Attending: Family Medicine

## 2018-07-01 DIAGNOSIS — G8929 Other chronic pain: Secondary | ICD-10-CM | POA: Diagnosis not present

## 2018-07-01 DIAGNOSIS — M25611 Stiffness of right shoulder, not elsewhere classified: Secondary | ICD-10-CM | POA: Diagnosis not present

## 2018-07-01 DIAGNOSIS — M25511 Pain in right shoulder: Secondary | ICD-10-CM | POA: Insufficient documentation

## 2018-07-01 NOTE — Therapy (Signed)
[facility] [address], [place], [address] Phone: [phone]   Fax:  [phone]  Physical Therapy Treatment  Patient Details  Name: Makayia Duplessis MRN: [id number] Date of Birth: 10-12-1950 No data recorded  Encounter Date: 07/01/2018  PT End of Session - 07/01/18 1454    Visit Number  36    Number of Visits  42    Date for PT Re-Evaluation  07/01/18    PT Start Time  1347    PT Stop Time  1434    PT Time Calculation (min)  47 min    Activity Tolerance  Patient tolerated treatment well    Behavior During Therapy  [facility] for tasks assessed/performed       Past Medical History:  Diagnosis Date  . Allergy   . Depression   . DVT (deep venous thrombosis) ([place])   . Edema   . Frequent headaches   . Gastric ulcer 10/27/2015  . GERD (gastroesophageal reflux disease)   . HTN (hypertension)   . Microalbuminuria   . Migraine   . Monocular diplopia of right eye 10/22/2016  . Shingles   . Symptomatic menopausal or female climacteric states   . UTI (lower urinary tract infection)     Past Surgical History:  Procedure Laterality Date  . AUGMENTATION MAMMAPLASTY Bilateral 1983  . BREAST ENHANCEMENT SURGERY  1983  . NASAL SEPTUM SURGERY  2000    There were no vitals filed for this visit.  Subjective Assessment - 07/01/18 1452    Subjective  Patient demonstrates improvement with pain and spasms and reports she has been able to lift and carry a Christmas tree.     Limitations  House hold activities;Lifting    Patient Stated Goals  get back to everyday tasks, be able to do yardwork and reach up in cabinents    Currently in Pain?  No/denies    Pain Onset  More than a month ago         TREATMENT  Manual Therapy  Grade III Inferior and posterior glenohumeral mobilizations to improve joint mobility, decrease pain, and improve AROM/PROM, patient in supine x10 min    STM performed to the periscapular and deep  shoulder stabilizers with patient positioned in supine to decrease increased pain and spasms      Therapeutic Exercises Shoulder flexion B with yellow physioball -- x20 Standing shoulder ER with RTB -- x 20 Standing shoulder IR with RTB - x 20  Lat pull down in sitting at [employer] - x20 20#   Neuroscience reeducation: Explained pain using the pain as an alarm system metaphor and the brain positioning of the brain of situations based on severity. Patient educated on importance of coping mechanism with psychological stressors and their effect on pain  Added brainstorming social reintegration ideas to HEP     Patient demonstrates increased fatigue at the end of the session  PT Education - 07/01/18 1453    Education Details  form/technique with exercise; pain education using the alarm system anaology    Person(s) Educated  Patient    Methods  Explanation;Demonstration    Comprehension  Verbalized understanding;Returned demonstration          PT Long Term Goals - 06/03/18 1235      PT LONG TERM GOAL #1   Title  Pt will be independent and compliant with HEP in order to maintain gains made in PT and return to prior level of funciton.  Baseline  Pt dependent on proper form and technique for HEP exercises 03/12/18: min cueing required for proper form; 06/03/2018: performs HEP, requires cueing for progression of exercises    Time  4    Period  Weeks    Status  On-going      PT LONG TERM GOAL #2   Title  Pt will report worst pain in past week less than 2/10 in order to return to prior level of function    Baseline  4/10; 03/12/18: 3/10; 06/03/2018: 2/10    Time  6    Period  Weeks    Status  On-going      PT LONG TERM GOAL #3   Title  Pt will demonstrate full, pain free, shoulder ROM in order to reach into cabinets, perform houshold activities and return to prior level of function.    Baseline  flex 100 deg, ABD 75 deg, ER 25, IR 15; 03/12/18: flexion 125, ABD 90 ER/IR deffered to next  visit ; 06/03/2018: 155 flexion, abduction 120; ER: 60, IR: 55    Time  6    Period  Weeks    Status  On-going      PT LONG TERM GOAL #4   Title  Pt will improve FOTO score from 49 to 62 in order to demonstrate an improvement in function.    Baseline  current FOTO: 49; 03/12/18: deferred to next visit    Time  6    Period  Weeks    Status  On-going            Plan - 07/01/18 1455    Clinical Impression Statement  Patient demonstrates improvement overall with ability to perform greater amount of exercises through a greater AROM compared to previous visits. Patient also reports ability to lift a greater amount of objects compared to previous days and states her shoulder is improving. Patient continues to have decreased muscular endurance and will benefit from further skilled therapy to return to prior level of function.     Rehab Potential  Good    Clinical Impairments Affecting Rehab Potential  +motivated    PT Frequency  2x / week    PT Duration  6 weeks    PT Treatment/Interventions  ADLs/Self Care Home Management;Aquatic Therapy;Cryotherapy;Electrical Stimulation;Moist Heat;Ultrasound;Therapeutic activities;Therapeutic exercise;Patient/family education;Manual techniques;Passive range of motion;Dry needling    PT Next Visit Plan  STM to posterior RTC musculature, thoracic mobility     PT Home Exercise Plan  see education section    Consulted and Agree with Plan of Care  Patient       Patient will benefit from skilled therapeutic intervention in order to improve the following deficits and impairments:  Increased muscle spasms, Decreased range of motion, Impaired UE functional use, Pain, Decreased strength  Visit Diagnosis: Chronic right shoulder pain  Stiffness of right shoulder, not elsewhere classified     Problem List Patient Active Problem List   Diagnosis Date Noted  . Acute pain of right shoulder 01/06/2018  . Cervical stenosis (uterine cervix) 09/09/2017  .  Elevated BP without diagnosis of hypertension 06/06/2017  . Bilateral lower extremity edema 01/30/2016  . Obesity 12/27/2015  . Frequent headaches 10/27/2015  . Environmental allergies 10/27/2015  . DVT (deep venous thrombosis) ([place]) 10/27/2015  . Depression with anxiety 10/23/2015  . GERD (gastroesophageal reflux disease) 10/23/2015  . Vitamin D deficiency 10/23/2015    Blythe Stanford, PT DPT 07/01/2018, 3:00 PM  [facility] [address] [place], [address] Phone: [phone]   Fax:  [phone]  Name: Fermina Mishkin MRN: [id number] Date of Birth: [birth year]

## 2018-07-06 DIAGNOSIS — J324 Chronic pansinusitis: Secondary | ICD-10-CM | POA: Diagnosis not present

## 2018-07-07 ENCOUNTER — Encounter: Payer: Self-pay | Admitting: Family Medicine

## 2018-07-07 ENCOUNTER — Other Ambulatory Visit: Payer: Self-pay

## 2018-07-08 ENCOUNTER — Ambulatory Visit: Payer: PPO

## 2018-07-10 DIAGNOSIS — B029 Zoster without complications: Secondary | ICD-10-CM | POA: Diagnosis not present

## 2018-07-10 DIAGNOSIS — B019 Varicella without complication: Secondary | ICD-10-CM | POA: Diagnosis not present

## 2018-07-13 ENCOUNTER — Encounter: Payer: Self-pay | Admitting: Family Medicine

## 2018-07-13 ENCOUNTER — Ambulatory Visit (INDEPENDENT_AMBULATORY_CARE_PROVIDER_SITE_OTHER): Payer: PPO | Admitting: Family Medicine

## 2018-07-13 DIAGNOSIS — F418 Other specified anxiety disorders: Secondary | ICD-10-CM

## 2018-07-13 DIAGNOSIS — B029 Zoster without complications: Secondary | ICD-10-CM | POA: Insufficient documentation

## 2018-07-13 MED ORDER — ALPRAZOLAM 0.5 MG PO TABS: 0.2500 mg | ORAL_TABLET | Freq: Every day | ORAL | 0 refills | Status: DC | PRN

## 2018-07-13 NOTE — Progress Notes (Signed)
Tommi Rumps, MD Phone: [phone]  Michele Long is a 67 y.o. female who presents today for follow-up.  CC: Anxiety/depression, shingles  Anxiety/depression: Patient notes this has worsened.  It typically does worsen this time a year as she has had multiple family members that have passed away at this time of the year including her husband, mom, and father.  She continues on Celexa and Wellbutrin.  She does note some depression.  No SI.  In the past she has taken Xanax very infrequently as needed for her anxiety.  She had a prescription the last 3 to 4 years.  Shingles: Patient was diagnosed with this about 2 weeks ago.  She has been seen twice.  She was initially started on an antiviral though subsequently changed to Valtrex.  She is been on the Valtrex for about 3 days.  She takes Vicodin 2-3 times a day for the pain.  She notes the Vicodin does bring the pain from a 10 out of 10 down to about a 6 out of 10.  She has had no fevers.  She overall feels "yuck."  She has not had any new lesions recently.  She does have a history of shingles in the past.  She has not gotten the shingles vaccine.  Social History   Tobacco Use  Smoking Status Never Smoker  Smokeless Tobacco Never Used     ROS see history of present illness  Objective  Physical Exam Vitals:   07/13/18 1524  BP: 120/84  Pulse: 90  Temp: 98.4 F (36.9 C)  SpO2: 95%    BP Readings from Last 3 Encounters:  07/13/18 120/84  04/22/18 140/84  04/13/18 120/84   Wt Readings from Last 3 Encounters:  07/13/18 221 lb (100.2 kg)  04/22/18 221 lb 9.6 oz (100.5 kg)  04/13/18 220 lb (99.8 kg)    Physical Exam Constitutional:      General: She is not in acute distress.    Appearance: She is not diaphoretic.  Cardiovascular:     Rate and Rhythm: Normal rate and regular rhythm.     Heart sounds: Normal heart sounds.  Pulmonary:     Effort: Pulmonary effort is normal.     Breath sounds: Normal breath sounds.  Chest:     Skin:    General: Skin is warm and dry.  Neurological:     Mental Status: She is alert.  Psychiatric:     Comments: Mood depressed and anxious, affect flat, intermittently tearful      Assessment/Plan: Please see individual problem list.  Depression with anxiety Typically worsens this time of year.  She will continue Celexa and Wellbutrin.  Will provide with a small amount of Xanax to take as needed.  Discussed potential for drowsiness with this.  Advised not to take this with her Vicodin.  Shingles New issue to me.  Her rash appears to have crusted.  She has had no new lesions recently.  Discussed finishing her Valtrex.  She will take the Vicodin as needed as prescribed for her pain.  If she runs out and continues to have pain she will let us know.  She will follow-up in 1 week for recheck.  Discussed avoiding immunocompromised people, pregnant women, and anyone that has not had chickenpox.  Discussed getting Shingrix once her acute rash has resolved.  No orders of the defined types were placed in this encounter.   Meds ordered this encounter  Medications  . ALPRAZolam (XANAX) 0.5 MG tablet  Sig: Take 0.5 tablets (0.25 mg total) by mouth daily as needed for anxiety.    Dispense:  20 tablet    Refill:  0     Tommi Rumps, MD [place]

## 2018-07-13 NOTE — Patient Instructions (Signed)
Nice to see you. Please continue the valtrex.  You can continue the hydrocodone as previously prescribed from the walk-in clinic for your pain.  If you run out before Friday please contact us. Please keep the rash covered.  Please avoid pregnant women, anyone that has not had chickenpox, and anybody that is immunocompromised.  I sent a low-dose of Xanax for you to take on an infrequent basis for your anxiety.  Do not take this and the hydrocodone anytime near each other.  They should be separated by at least 4 to 5 hours.  If you become drowsy with these medications please do not drive.

## 2018-07-13 NOTE — Assessment & Plan Note (Addendum)
New issue to me.  Her rash appears to have crusted.  She has had no new lesions recently.  Discussed finishing her Valtrex.  She will take the Vicodin as needed as prescribed for her pain.  If she runs out and continues to have pain she will let us know.  She will follow-up in 1 week for recheck.  Discussed avoiding immunocompromised people, pregnant women, and anyone that has not had chickenpox.  Discussed getting Shingrix once her acute rash has resolved.

## 2018-07-13 NOTE — Assessment & Plan Note (Signed)
Typically worsens this time of year.  She will continue Celexa and Wellbutrin.  Will provide with a small amount of Xanax to take as needed.  Discussed potential for drowsiness with this.  Advised not to take this with her Vicodin.

## 2018-07-15 ENCOUNTER — Ambulatory Visit: Payer: PPO

## 2018-07-16 ENCOUNTER — Encounter: Payer: Self-pay | Admitting: Family Medicine

## 2018-07-16 MED ORDER — HYDROCODONE-ACETAMINOPHEN 5-325 MG PO TABS: 1.0000 | ORAL_TABLET | Freq: Four times a day (QID) | ORAL | 0 refills | Status: DC | PRN

## 2018-07-20 ENCOUNTER — Encounter: Payer: Self-pay | Admitting: Family Medicine

## 2018-07-20 ENCOUNTER — Ambulatory Visit (INDEPENDENT_AMBULATORY_CARE_PROVIDER_SITE_OTHER): Payer: PPO | Admitting: Family Medicine

## 2018-07-20 VITALS — BP 122/66 | HR 87 | Temp 98.0°F | Ht 66.0 in | Wt 221.2 lb

## 2018-07-20 DIAGNOSIS — B029 Zoster without complications: Secondary | ICD-10-CM | POA: Diagnosis not present

## 2018-07-20 NOTE — Addendum Note (Signed)
Addended by: Philis Nettle on: 07/20/2018 02:57 PM   Modules accepted: Level of Service

## 2018-07-20 NOTE — Patient Instructions (Addendum)
Shingles looking improved!  Faint rash still present, once rash is fully resolved we will consider the shingrix vaccine

## 2018-07-20 NOTE — Progress Notes (Addendum)
Subjective:    Patient ID: Michele Long, female    DOB: 1951-05-07, 67 y.o.   MRN: [id number]  HPI  Presents to clinic for 1 week follow up on shingles, this is a new problem to me. She has been treated with valtrex and Vicodin PRN pain.  Past office notes reviewed.  Overall she is feeling improved. Rash crusting has resolved and only faint redness to skin remains.  She no longer requires use of Vicodin for pain.   Patient Active Problem List   Diagnosis Date Noted  . Shingles 07/13/2018  . Acute pain of right shoulder 01/06/2018  . Cervical stenosis (uterine cervix) 09/09/2017  . Elevated BP without diagnosis of hypertension 06/06/2017  . Bilateral lower extremity edema 01/30/2016  . Obesity 12/27/2015  . Frequent headaches 10/27/2015  . Environmental allergies 10/27/2015  . DVT (deep venous thrombosis) ([place]) 10/27/2015  . Depression with anxiety 10/23/2015  . GERD (gastroesophageal reflux disease) 10/23/2015  . Vitamin D deficiency 10/23/2015   Social History   Tobacco Use  . Smoking status: Never Smoker  . Smokeless tobacco: Never Used  Substance Use Topics  . Alcohol use: No    Alcohol/week: 0.0 standard drinks   Review of Systems  Constitutional: Negative for chills, fatigue and fever.  HENT: Negative for congestion, ear pain, sinus pain and sore throat.   Eyes: Negative.   Respiratory: Negative for cough, shortness of breath and wheezing.   Cardiovascular: Negative for chest pain, palpitations and leg swelling.  Gastrointestinal: Negative for abdominal pain, diarrhea, nausea and vomiting.  Genitourinary: Negative for dysuria, frequency and urgency.  Musculoskeletal: Negative for arthralgias and myalgias.  Skin: shingles rash - improving.  Neurological: Negative for syncope, light-headedness and headaches.  Psychiatric/Behavioral: The patient is not nervous/anxious.       Objective:   Physical Exam Vitals signs and nursing note reviewed.  Constitutional:       General: She is not in acute distress.    Appearance: Normal appearance. She is not ill-appearing or toxic-appearing.  HENT:     Head: Normocephalic and atraumatic.     Mouth/Throat:     Mouth: Mucous membranes are moist.  Eyes:     General: No scleral icterus.    Extraocular Movements: Extraocular movements intact.     Conjunctiva/sclera: Conjunctivae normal.  Cardiovascular:     Rate and Rhythm: Normal rate and regular rhythm.  Pulmonary:     Effort: Pulmonary effort is normal. No respiratory distress.  Skin:    Findings: Rash present.          Comments: Faint red rash area represented by red circle on diagram, there is a small patch on the left front chest and left upper back.  Crusting has resolved.  Neurological:     Mental Status: She is alert and oriented to person, place, and time.     Gait: Gait normal.  Psychiatric:        Mood and Affect: Mood normal.        Behavior: Behavior normal.       Vitals:   07/20/18 1401  BP: 122/66  Pulse: 87  Temp: 98 F (36.7 C)  SpO2: 94%   Assessment & Plan:   Herpes zoster - shingles rash looking improved.  Patient aware that the rash should continue to slowly resolve on its own.  Advised if needed she can use Tylenol for pain, and she can use regular moisturizing skin lotion to help keep skin from being dry.  Patient  will keep regularly scheduled follow-up with PCP as planned.  Advised to return to clinic sooner if any issues arise.

## 2018-07-30 ENCOUNTER — Ambulatory Visit: Payer: PPO | Attending: Family Medicine

## 2018-07-30 DIAGNOSIS — M25511 Pain in right shoulder: Secondary | ICD-10-CM | POA: Diagnosis not present

## 2018-07-30 DIAGNOSIS — M25611 Stiffness of right shoulder, not elsewhere classified: Secondary | ICD-10-CM | POA: Diagnosis not present

## 2018-07-30 DIAGNOSIS — G8929 Other chronic pain: Secondary | ICD-10-CM | POA: Insufficient documentation

## 2018-07-30 NOTE — Therapy (Signed)
[facility] [address] [place], [address] Phone: [phone]   Fax:  [phone]  Physical Therapy Treatment  Patient Details  Name: Michele Long MRN: [id number] Date of Birth: 1951-07-17 No data recorded  Encounter Date: 07/30/2018  PT End of Session - 07/30/18 0929    Visit Number  37    Number of Visits  42    Date for PT Re-Evaluation  07/01/18    PT Start Time  0900    PT Stop Time  0945    PT Time Calculation (min)  45 min    Activity Tolerance  Patient tolerated treatment well    Behavior During Therapy  [facility] for tasks assessed/performed       Past Medical History:  Diagnosis Date  . Allergy   . Depression   . DVT (deep venous thrombosis) ([place])   . Edema   . Frequent headaches   . Gastric ulcer 10/27/2015  . GERD (gastroesophageal reflux disease)   . HTN (hypertension)   . Microalbuminuria   . Migraine   . Monocular diplopia of right eye 10/22/2016  . Shingles   . Symptomatic menopausal or female climacteric states   . UTI (lower urinary tract infection)     Past Surgical History:  Procedure Laterality Date  . AUGMENTATION MAMMAPLASTY Bilateral 1983  . BREAST ENHANCEMENT SURGERY  1983  . NASAL SEPTUM SURGERY  2000    There were no vitals filed for this visit.  Subjective Assessment - 07/30/18 0919    Subjective  Patient reports she has not been to therapy secondary to having shingles. Patient reports the pain has not been as severe but states she's had decreased AROM.     Limitations  House hold activities;Lifting    Patient Stated Goals  get back to everyday tasks, be able to do yardwork and reach up in cabinents    Currently in Pain?  No/denies    Pain Onset  More than a month ago       TREATMENT  Manual Therapy  Grade III Inferior and posterior glenohumeral mobilizations to improve joint mobility, decrease pain, and improve AROM/PROM, patient in supine x5 min    STM  performed to the periscapular and deep shoulder stabilizers with patient positioned in supine to decrease increased pain and spasms    Therapeutic Exercises Shoulder flexion B with yellow physioball -- x20 Shoulder abduction with Yellow phyioball - x 20  Pec Stretch in corner - x 20 Weight passes behind back - x 20 2# Shoulder ER at wall - x 20  Shoulder ER with pulley - x 20  Shoulder flexion with pulley - x20      Patient demonstrates increased fatigue at the end of the session    PT Education - 07/30/18 0928    Education Details  form/technique with exercise    Person(s) Educated  Patient    Methods  Explanation;Demonstration    Comprehension  Verbalized understanding;Returned demonstration          PT Long Term Goals - 07/30/18 0942      PT LONG TERM GOAL #1   Title  Pt will be independent and compliant with HEP in order to maintain gains made in PT and return to prior level of funciton.    Baseline  Pt dependent on proper form and technique for HEP exercises 03/12/18: min cueing required for proper form; 06/03/2018: performs HEP, requires cueing for progression of exercises  Time  4    Period  Weeks    Status  On-going      PT LONG TERM GOAL #2   Title  Pt will report worst pain in past week less than 2/10 in order to return to prior level of function    Baseline  4/10; 03/12/18: 3/10; 06/03/2018: 2/10; 07/30/2018: 1/10    Time  6    Period  Weeks    Status  Achieved      PT LONG TERM GOAL #3   Title  Pt will demonstrate full, pain free, shoulder ROM in order to reach into cabinets, perform houshold activities and return to prior level of function.    Baseline  flex 100 deg, ABD 75 deg, ER 25, IR 15; 03/12/18: flexion 125, ABD 90 ER/IR deffered to next visit ; 06/03/2018: 155 flexion, abduction 120; ER: 60, IR: 55; 07/30/2018: flexion: 122, abduction 122: ER:40, IR:55     Time  6    Period  Weeks    Status  On-going      PT LONG TERM GOAL #4   Title  Pt will improve  FOTO score from 49 to 62 in order to demonstrate an improvement in function.    Baseline  current FOTO: 49; 03/12/18: deferred to next visit; 07/30/2018: 60    Time  6    Period  Weeks    Status  On-going            Plan - 07/30/18 0945    Clinical Impression Statement  Patient is improving in terms of function with improvement with function as indicated by FOTO scores and decreased pain. Although patient is improving, she continues to have decreased movement compared to previous sessions most likely from overall decreased use of UE since contracting shingles. Patient will benefit from further skilled therapy focused on improving limitations such as decreased AROM and will benefit from further skilled therapy to return to prior level of function.     Rehab Potential  Good    Clinical Impairments Affecting Rehab Potential  +motivated    PT Frequency  2x / week    PT Duration  6 weeks    PT Treatment/Interventions  ADLs/Self Care Home Management;Aquatic Therapy;Cryotherapy;Electrical Stimulation;Moist Heat;Ultrasound;Therapeutic activities;Therapeutic exercise;Patient/family education;Manual techniques;Passive range of motion;Dry needling    PT Next Visit Plan  STM to posterior RTC musculature, thoracic mobility     PT Home Exercise Plan  see education section    Consulted and Agree with Plan of Care  Patient       Patient will benefit from skilled therapeutic intervention in order to improve the following deficits and impairments:  Increased muscle spasms, Decreased range of motion, Impaired UE functional use, Pain, Decreased strength  Visit Diagnosis: Stiffness of right shoulder, not elsewhere classified  Chronic right shoulder pain     Problem List Patient Active Problem List   Diagnosis Date Noted  . Shingles 07/13/2018  . Acute pain of right shoulder 01/06/2018  . Cervical stenosis (uterine cervix) 09/09/2017  . Elevated BP without diagnosis of hypertension 06/06/2017  .  Bilateral lower extremity edema 01/30/2016  . Obesity 12/27/2015  . Frequent headaches 10/27/2015  . Environmental allergies 10/27/2015  . DVT (deep venous thrombosis) ([place]) 10/27/2015  . Depression with anxiety 10/23/2015  . GERD (gastroesophageal reflux disease) 10/23/2015  . Vitamin D deficiency 10/23/2015    Blythe Stanford, PT DPT 07/30/2018, 9:50 AM  [facility] [address], [place], [address] Phone: [phone]   Fax:  [phone]  Name: Michele Long MRN: [id number] Date of Birth: 02/23/1951

## 2018-08-02 ENCOUNTER — Other Ambulatory Visit: Payer: Self-pay | Admitting: Family Medicine

## 2018-08-03 ENCOUNTER — Ambulatory Visit: Payer: PPO

## 2018-08-03 DIAGNOSIS — M25511 Pain in right shoulder: Secondary | ICD-10-CM

## 2018-08-03 DIAGNOSIS — M25611 Stiffness of right shoulder, not elsewhere classified: Secondary | ICD-10-CM | POA: Diagnosis not present

## 2018-08-03 DIAGNOSIS — G8929 Other chronic pain: Secondary | ICD-10-CM

## 2018-08-03 NOTE — Therapy (Signed)
[facility] [address] [place], [address] Phone: [phone]   Fax:  [phone]  Physical Therapy Treatment  Patient Details  Name: Michele Long MRN: [id number] Date of Birth: 02/20/1951 No data recorded  Encounter Date: 08/03/2018  PT End of Session - 08/03/18 1610    Visit Number  38    Number of Visits  42    Date for PT Re-Evaluation  08/27/18    PT Start Time  1120    PT Stop Time  1200    PT Time Calculation (min)  40 min    Activity Tolerance  Patient tolerated treatment well    Behavior During Therapy  [facility] for tasks assessed/performed       Past Medical History:  Diagnosis Date  . Allergy   . Depression   . DVT (deep venous thrombosis) ([place])   . Edema   . Frequent headaches   . Gastric ulcer 10/27/2015  . GERD (gastroesophageal reflux disease)   . HTN (hypertension)   . Microalbuminuria   . Migraine   . Monocular diplopia of right eye 10/22/2016  . Shingles   . Symptomatic menopausal or female climacteric states   . UTI (lower urinary tract infection)     Past Surgical History:  Procedure Laterality Date  . AUGMENTATION MAMMAPLASTY Bilateral 1983  . BREAST ENHANCEMENT SURGERY  1983  . NASAL SEPTUM SURGERY  2000    There were no vitals filed for this visit.  Subjective Assessment - 08/03/18 1430    Subjective  Patient reports she has had an aggravation of her shingles symptoms over the past two days which she reports is aggravating and fustrating.     Limitations  House hold activities;Lifting    Patient Stated Goals  get back to everyday tasks, be able to do yardwork and reach up in cabinents    Currently in Pain?  No/denies    Pain Onset  More than a month ago       TREATMENT  Manual Therapy Grade III Inferior and posterior glenohumeral mobilizations to improve joint mobility, decrease pain, and improve AROM/PROM, patient in supine x85min   STM performed to the  periscapular and deep shoulder stabilizerswith patient positioned in supineto decrease increased pain and spasms  Therapeutic Exercise Rows in standing at [place] - x 20 10# Horizontal abduction in standing with row at [place] - x 20 5# Shoulder flexion with physioball - x 20  Standing shoulder ER at wall - x 20  Pec stretch in standing in corner - x 20  Shoulder ER with YTB - x 20   Patient demonstrates increased fatigue at the end of the session    PT Education - 08/03/18 1608    Education Details  form/technique with exercise    Person(s) Educated  Patient    Methods  Explanation;Demonstration    Comprehension  Verbalized understanding;Returned demonstration          PT Long Term Goals - 07/30/18 0942      PT LONG TERM GOAL #1   Title  Pt will be independent and compliant with HEP in order to maintain gains made in PT and return to prior level of funciton.    Baseline  Pt dependent on proper form and technique for HEP exercises 03/12/18: min cueing required for proper form; 06/03/2018: performs HEP, requires cueing for progression of exercises    Time  4    Period  Weeks  Status  On-going      PT LONG TERM GOAL #2   Title  Pt will report worst pain in past week less than 2/10 in order to return to prior level of function    Baseline  4/10; 03/12/18: 3/10; 06/03/2018: 2/10; 07/30/2018: 1/10    Time  6    Period  Weeks    Status  Achieved      PT LONG TERM GOAL #3   Title  Pt will demonstrate full, pain free, shoulder ROM in order to reach into cabinets, perform houshold activities and return to prior level of function.    Baseline  flex 100 deg, ABD 75 deg, ER 25, IR 15; 03/12/18: flexion 125, ABD 90 ER/IR deffered to next visit ; 06/03/2018: 155 flexion, abduction 120; ER: 60, IR: 55; 07/30/2018: flexion: 122, abduction 122: ER:40, IR:55     Time  6    Period  Weeks    Status  On-going      PT LONG TERM GOAL #4   Title  Pt will improve FOTO score from 49 to 62 in order to  demonstrate an improvement in function.    Baseline  current FOTO: 49; 03/12/18: deferred to next visit; 07/30/2018: 60    Time  6    Period  Weeks    Status  On-going            Plan - 08/03/18 1611    Clinical Impression Statement  Patient demonstrates improvement with shoulder flexion at the end of the session and compared to previous sessions indicating functional carryover. Although patient is improving, she continues to demonstrate poor shoulder ER and flexion with exercise and patient will benefit from further skilled therapy to return to prior level of function.    Rehab Potential  Good    Clinical Impairments Affecting Rehab Potential  +motivated    PT Frequency  2x / week    PT Duration  6 weeks    PT Treatment/Interventions  ADLs/Self Care Home Management;Aquatic Therapy;Cryotherapy;Electrical Stimulation;Moist Heat;Ultrasound;Therapeutic activities;Therapeutic exercise;Patient/family education;Manual techniques;Passive range of motion;Dry needling    PT Next Visit Plan  STM to posterior RTC musculature, thoracic mobility     PT Home Exercise Plan  see education section    Consulted and Agree with Plan of Care  Patient       Patient will benefit from skilled therapeutic intervention in order to improve the following deficits and impairments:  Increased muscle spasms, Decreased range of motion, Impaired UE functional use, Pain, Decreased strength  Visit Diagnosis: Stiffness of right shoulder, not elsewhere classified  Chronic right shoulder pain     Problem List Patient Active Problem List   Diagnosis Date Noted  . Shingles 07/13/2018  . Acute pain of right shoulder 01/06/2018  . Cervical stenosis (uterine cervix) 09/09/2017  . Elevated BP without diagnosis of hypertension 06/06/2017  . Bilateral lower extremity edema 01/30/2016  . Obesity 12/27/2015  . Frequent headaches 10/27/2015  . Environmental allergies 10/27/2015  . DVT (deep venous thrombosis) ([place])  10/27/2015  . Depression with anxiety 10/23/2015  . GERD (gastroesophageal reflux disease) 10/23/2015  . Vitamin D deficiency 10/23/2015    Blythe Stanford, PT DPT 08/03/2018, 4:49 PM  [facility] [address], [place], [address] Phone: [phone]   Fax:  [phone]  Name: Kary Sugrue MRN: [id number] Date of Birth: [birth year]

## 2018-08-05 ENCOUNTER — Ambulatory Visit: Payer: PPO

## 2018-08-05 DIAGNOSIS — G8929 Other chronic pain: Secondary | ICD-10-CM

## 2018-08-05 DIAGNOSIS — M25611 Stiffness of right shoulder, not elsewhere classified: Secondary | ICD-10-CM

## 2018-08-05 DIAGNOSIS — M25511 Pain in right shoulder: Secondary | ICD-10-CM

## 2018-08-05 NOTE — Therapy (Signed)
[facility] [address] [place], [address] Phone: [phone]   Fax:  [phone]  Physical Therapy Treatment  Patient Details  Name: Michele Long MRN: [id number] Date of Birth: 1951-03-06 No data recorded  Encounter Date: 08/05/2018  PT End of Session - 08/05/18 1342    Visit Number  38    Number of Visits  42    Date for PT Re-Evaluation  08/27/18    PT Start Time  1115    PT Stop Time  1200    PT Time Calculation (min)  45 min    Activity Tolerance  Patient tolerated treatment well    Behavior During Therapy  [facility] for tasks assessed/performed       Past Medical History:  Diagnosis Date  . Allergy   . Depression   . DVT (deep venous thrombosis) ([place])   . Edema   . Frequent headaches   . Gastric ulcer 10/27/2015  . GERD (gastroesophageal reflux disease)   . HTN (hypertension)   . Microalbuminuria   . Migraine   . Monocular diplopia of right eye 10/22/2016  . Shingles   . Symptomatic menopausal or female climacteric states   . UTI (lower urinary tract infection)     Past Surgical History:  Procedure Laterality Date  . AUGMENTATION MAMMAPLASTY Bilateral 1983  . BREAST ENHANCEMENT SURGERY  1983  . NASAL SEPTUM SURGERY  2000    There were no vitals filed for this visit.  Subjective Assessment - 08/05/18 1336    Subjective  Pt reports shoulder feeling "pretty good".     Limitations  House hold activities;Lifting    Patient Stated Goals  get back to everyday tasks, be able to do yardwork and reach up in cabinents    Pain Onset  More than a month ago       TREATMENT   Manual Therapy  5x30 inf joint mobs of shoulder. Shoulder elevated to ~ 160   5x30 post joint mobs of shoulder.    Therapeutic Exercise  Shoulder ER MET in Supine with elbow at 90. 2x10 holds  Shoulder elevation ball raises 3x15   Shoulder ER stretch on wall 2x25  Theraband (Red) straight arm pulldown 2x20  Side-lying DB  (3 lbs) ER- 2x20   Patient demonstrates improvement with exercises performned manual therapy and therapeutic exercise to improve mobility to allow for functional activity such as rasing overhead   PT Education - 08/05/18 1339    Education Details  Pt educated on exercise form/technique. Also educated on which muscles are used for shoulder abduction vs ER.     Person(s) Educated  Patient    Methods  Explanation;Demonstration;Verbal cues    Comprehension  Verbalized understanding          PT Long Term Goals - 07/30/18 0942      PT LONG TERM GOAL #1   Title  Pt will be independent and compliant with HEP in order to maintain gains made in PT and return to prior level of funciton.    Baseline  Pt dependent on proper form and technique for HEP exercises 03/12/18: min cueing required for proper form; 06/03/2018: performs HEP, requires cueing for progression of exercises    Time  4    Period  Weeks    Status  On-going      PT LONG TERM GOAL #2   Title  Pt will report worst pain in past week less than 2/10 in  order to return to prior level of function    Baseline  4/10; 03/12/18: 3/10; 06/03/2018: 2/10; 07/30/2018: 1/10    Time  6    Period  Weeks    Status  Achieved      PT LONG TERM GOAL #3   Title  Pt will demonstrate full, pain free, shoulder ROM in order to reach into cabinets, perform houshold activities and return to prior level of function.    Baseline  flex 100 deg, ABD 75 deg, ER 25, IR 15; 03/12/18: flexion 125, ABD 90 ER/IR deffered to next visit ; 06/03/2018: 155 flexion, abduction 120; ER: 60, IR: 55; 07/30/2018: flexion: 122, abduction 122: ER:40, IR:55     Time  6    Period  Weeks    Status  On-going      PT LONG TERM GOAL #4   Title  Pt will improve FOTO score from 49 to 62 in order to demonstrate an improvement in function.    Baseline  current FOTO: 49; 03/12/18: deferred to next visit; 07/30/2018: 60    Time  6    Period  Weeks    Status  On-going            Plan -  08/05/18 1343    Clinical Impression Statement  Pt is doing well with performing Right shoulder elevation and right shoulder ER exercises and stretches. Shoulder elevation is in a functional ROM however, it is still limited with~160 deg. of motion. AROM/PROM for right shoulder ER is more limited. Pt made improvement in ROM and is becoming stronger with ER exercises. Pt is motivated and willing to do all PT treatments in order to keep improving function and strength. Due to limited right shoulder ROM, PT is still indicated.    Rehab Potential  Good    Clinical Impairments Affecting Rehab Potential  +motivated    PT Frequency  2x / week    PT Duration  6 weeks    PT Treatment/Interventions  ADLs/Self Care Home Management;Aquatic Therapy;Cryotherapy;Electrical Stimulation;Moist Heat;Ultrasound;Therapeutic activities;Therapeutic exercise;Patient/family education;Manual techniques;Passive range of motion;Dry needling    PT Next Visit Plan  Shoulder elevation ROM and strengthening, thoracic and periscapular muscle strengthening    PT Home Exercise Plan  see education section    Consulted and Agree with Plan of Care  Patient       Patient will benefit from skilled therapeutic intervention in order to improve the following deficits and impairments:  Increased muscle spasms, Decreased range of motion, Impaired UE functional use, Pain, Decreased strength  Visit Diagnosis: Stiffness of right shoulder, not elsewhere classified  Chronic right shoulder pain     Problem List Patient Active Problem List   Diagnosis Date Noted  . Shingles 07/13/2018  . Acute pain of right shoulder 01/06/2018  . Cervical stenosis (uterine cervix) 09/09/2017  . Elevated BP without diagnosis of hypertension 06/06/2017  . Bilateral lower extremity edema 01/30/2016  . Obesity 12/27/2015  . Frequent headaches 10/27/2015  . Environmental allergies 10/27/2015  . DVT (deep venous thrombosis) ([place]) 10/27/2015  . Depression  with anxiety 10/23/2015  . GERD (gastroesophageal reflux disease) 10/23/2015  . Vitamin D deficiency 10/23/2015    Rachael Fee, SPT 08/05/2018, 3:24 PM  [facility] [address] [place], [address] Phone: [phone]   Fax:  [phone]  Name: Michele Long MRN: [id number] Date of Birth: Aug 12, 1950

## 2018-08-11 ENCOUNTER — Ambulatory Visit: Payer: PPO

## 2018-08-11 DIAGNOSIS — G8929 Other chronic pain: Secondary | ICD-10-CM

## 2018-08-11 DIAGNOSIS — M25611 Stiffness of right shoulder, not elsewhere classified: Secondary | ICD-10-CM | POA: Diagnosis not present

## 2018-08-11 DIAGNOSIS — M25511 Pain in right shoulder: Secondary | ICD-10-CM

## 2018-08-11 NOTE — Therapy (Signed)
[facility] [address] [place], [address] Phone: [phone]   Fax:  [phone]  Physical Therapy Treatment  Patient Details  Name: Michele Long MRN: [id number] Date of Birth: Dec 09, 1950 No data recorded  Encounter Date: 08/11/2018  PT End of Session - 08/11/18 1048    Visit Number  40    Number of Visits  42    Date for PT Re-Evaluation  08/27/18    PT Start Time  0926    PT Stop Time  1014    PT Time Calculation (min)  48 min    Activity Tolerance  Patient tolerated treatment well    Behavior During Therapy  [facility] for tasks assessed/performed       Past Medical History:  Diagnosis Date  . Allergy   . Depression   . DVT (deep venous thrombosis) ([place])   . Edema   . Frequent headaches   . Gastric ulcer 10/27/2015  . GERD (gastroesophageal reflux disease)   . HTN (hypertension)   . Microalbuminuria   . Migraine   . Monocular diplopia of right eye 10/22/2016  . Shingles   . Symptomatic menopausal or female climacteric states   . UTI (lower urinary tract infection)     Past Surgical History:  Procedure Laterality Date  . AUGMENTATION MAMMAPLASTY Bilateral 1983  . BREAST ENHANCEMENT SURGERY  1983  . NASAL SEPTUM SURGERY  2000    There were no vitals filed for this visit.  Subjective Assessment - 08/11/18 1043    Subjective  Pt states shoulder continues to improve. Pt mentioned feeling fatigue and lack of strength with arm motion overhead during ADL's.    Limitations  House hold activities;Lifting    Patient Stated Goals  get back to everyday tasks, be able to do yardwork and reach up in cabinents    Pain Onset  More than a month ago       TREATMENT Therapeutic Exercise *all exercises performed in standing unless marked otherwise*  Shoulder ER MET in supine: 5 min  Shoulder ER on wall with shoulder at 90 deg: 3x30 sec  Shoulder Elevation wall crawl with THB around elbows (yellow): 2x15    Standing THB (Red) on right shoulder ER: 2x15  Shoulder (2 lbs) elevation: Neutral 2x20/scaption 2x20  Shoulder elevation (~160 deg) med ball (1 kg) rotation: Clockwise/counterclockwise 1x1 min  Sitting shoulder flexion on physioball: 2 min  Manual Therapy *performed in supine*  Glenohumeral inf joint mobs Grade III: 3x30 sec  Glenohumeral post joint mobs Grade III: 3x30 sec   Manual therapy of glenohumeral joint in post and inf directions to improve AROM for shoulder elevation to improve overhead ADL's. Pt is having fatigue and decreased strength with ADL's in overhead motions. Ther Ex provided to begin strengthening shoulder in overhead position. Shoulder MET, and stretch/mobility for ER to improve AROM. Ther Ex performed to improve shoulder ER AROM and strength to return to prior level of function.   PT Education - 08/11/18 1047    Education Details  Form/technique with exercise. Pt educated on purpose of serratus anterior for overhead shoulder motion.    Person(s) Educated  Patient    Methods  Explanation;Verbal cues    Comprehension  Verbalized understanding;Returned demonstration          PT Long Term Goals - 07/30/18 0942      PT LONG TERM GOAL #1   Title  Pt will be independent and compliant with HEP  in order to maintain gains made in PT and return to prior level of funciton.    Baseline  Pt dependent on proper form and technique for HEP exercises 03/12/18: min cueing required for proper form; 06/03/2018: performs HEP, requires cueing for progression of exercises    Time  4    Period  Weeks    Status  On-going      PT LONG TERM GOAL #2   Title  Pt will report worst pain in past week less than 2/10 in order to return to prior level of function    Baseline  4/10; 03/12/18: 3/10; 06/03/2018: 2/10; 07/30/2018: 1/10    Time  6    Period  Weeks    Status  Achieved      PT LONG TERM GOAL #3   Title  Pt will demonstrate full, pain free, shoulder ROM in order to reach into  cabinets, perform houshold activities and return to prior level of function.    Baseline  flex 100 deg, ABD 75 deg, ER 25, IR 15; 03/12/18: flexion 125, ABD 90 ER/IR deffered to next visit ; 06/03/2018: 155 flexion, abduction 120; ER: 60, IR: 55; 07/30/2018: flexion: 122, abduction 122: ER:40, IR:55     Time  6    Period  Weeks    Status  On-going      PT LONG TERM GOAL #4   Title  Pt will improve FOTO score from 49 to 62 in order to demonstrate an improvement in function.    Baseline  current FOTO: 49; 03/12/18: deferred to next visit; 07/30/2018: 60    Time  6    Period  Weeks    Status  On-going            Plan - 08/11/18 1049    Clinical Impression Statement  R shoulder beginning to improve symmetrical to uninvolved shoulder with shoulder elevation and ER AROM. Shoulder elevation continues to be in a functional overhead range, but is lacking strength in overhead motion during ADL's as indicated by increased fatigue with endurance based exercises with little resistance. Shoulder ER is most limited and requiring further skilled therapy to improve strength overhead and improve ER AROM to return to prior level of activity.     Rehab Potential  Good    Clinical Impairments Affecting Rehab Potential  +motivated    PT Frequency  2x / week    PT Duration  6 weeks    PT Treatment/Interventions  ADLs/Self Care Home Management;Aquatic Therapy;Cryotherapy;Electrical Stimulation;Moist Heat;Ultrasound;Therapeutic activities;Therapeutic exercise;Patient/family education;Manual techniques;Passive range of motion;Dry needling    PT Next Visit Plan  Overhead shoulder elevation strength with adding exercises for HEP. Continue to focus on ER ROM and strength. Assessing Long term goals for POC.    PT Home Exercise Plan  see education section    Consulted and Agree with Plan of Care  Patient       Patient will benefit from skilled therapeutic intervention in order to improve the following deficits and  impairments:  Increased muscle spasms, Decreased range of motion, Impaired UE functional use, Pain, Decreased strength  Visit Diagnosis: Stiffness of right shoulder, not elsewhere classified  Chronic right shoulder pain     Problem List Patient Active Problem List   Diagnosis Date Noted  . Shingles 07/13/2018  . Acute pain of right shoulder 01/06/2018  . Cervical stenosis (uterine cervix) 09/09/2017  . Elevated BP without diagnosis of hypertension 06/06/2017  . Bilateral lower extremity edema 01/30/2016  .  Obesity 12/27/2015  . Frequent headaches 10/27/2015  . Environmental allergies 10/27/2015  . DVT (deep venous thrombosis) ([place]) 10/27/2015  . Depression with anxiety 10/23/2015  . GERD (gastroesophageal reflux disease) 10/23/2015  . Vitamin D deficiency 10/23/2015    Rachael Fee, SPT 08/11/2018, 10:57 AM  [facility] [address] [place], [address] Phone: [phone]   Fax:  [phone]  Name: Michele Long MRN: [id number] Date of Birth: Apr 19, 1951

## 2018-08-13 ENCOUNTER — Ambulatory Visit: Payer: PPO

## 2018-08-25 ENCOUNTER — Ambulatory Visit: Payer: PPO

## 2018-08-25 DIAGNOSIS — M25611 Stiffness of right shoulder, not elsewhere classified: Secondary | ICD-10-CM

## 2018-08-25 DIAGNOSIS — G8929 Other chronic pain: Secondary | ICD-10-CM

## 2018-08-25 DIAGNOSIS — M25511 Pain in right shoulder: Secondary | ICD-10-CM

## 2018-08-25 NOTE — Therapy (Signed)
[place] [facility] [address] [place], [address] Phone: [phone]   Fax:  [phone]  Physical Therapy Treatment  Patient Details  Name: Michele Long MRN: [id number] Date of Birth: [birth year] No data recorded  Encounter Date: 08/25/2018  PT End of Session - 08/25/18 1346    Visit Number  41    Number of Visits  42    Date for PT Re-Evaluation  08/27/18    PT Start Time  1300    PT Stop Time  1345    PT Time Calculation (min)  45 min    Activity Tolerance  Patient tolerated treatment well    Behavior During Therapy  [facility] for tasks assessed/performed       Past Medical History:  Diagnosis Date  . Allergy   . Depression   . DVT (deep venous thrombosis) ([place])   . Edema   . Frequent headaches   . Gastric ulcer 10/27/2015  . GERD (gastroesophageal reflux disease)   . HTN (hypertension)   . Microalbuminuria   . Migraine   . Monocular diplopia of right eye 10/22/2016  . Shingles   . Symptomatic menopausal or female climacteric states   . UTI (lower urinary tract infection)     Past Surgical History:  Procedure Laterality Date  . AUGMENTATION MAMMAPLASTY Bilateral 1983  . BREAST ENHANCEMENT SURGERY  1983  . NASAL SEPTUM SURGERY  2000    There were no vitals filed for this visit.  Subjective Assessment - 08/25/18 1321    Subjective  Pt reports increased pain in shoulders bilaterally after previous session. Pt was concerned of previous shingles outbreak causing an increase in pain. Pt reports no shoulder pain prior to today's session.    Limitations  House hold activities;Lifting    Patient Stated Goals  get back to everyday tasks, be able to do yardwork and reach up in cabinents    Currently in Pain?  No/denies    Pain Onset  More than a month ago      TREATMENT  THERAPEUTIC EXERCISE Right shoulder ER MET with pt in supine: 3 min Standing shoulder elevation on physioball: 2 min  Standing DB  raises in scaption and neutral: 2x8, 2 lbs.  Playing tic-tac-toe/hangman in standing with shoulder elevation: 8 min Standing TB (Red) shoulder ER: 2x8 Standing rows with TB (Blue): 2x10 Lat pulldown: 1x10, 15 lbs. 1x10, 20 lbs.  MANUAL THERAPY 10 min total  Glenohumeral mobilizations with pt in supine: inf mobs 2x30 secs Grade III and post mobs 2x30 secs Grade III.  Post and inf Grade III mobs 1x30 at ~160 degrees of shoulder flexion. STM to latissiumus dorsi and teres minor to reduce muscle spasm and improve right shoulder AROM.  Therapeutic exercise to improve right shoulder AROM and strength in overhead position. Manual therapy in posterior and inferior directions to improve right shoulder flexion AROM and STM to improve right shoulder AROM ER.  PT Education - 08/25/18 1329    Education Details  Biomechanics of shoulder elevation with neutral arm position to allow improved shoulder AROM due to placement of greater and lesser tubercles of humerus. Form/tehcnique with exercise.    Person(s) Educated  Patient    Methods  Explanation;Verbal cues    Comprehension  Verbalized understanding;Returned demonstration          PT Long Term Goals - 07/30/18 0942      PT LONG TERM GOAL #1   Title  Pt will  be independent and compliant with HEP in order to maintain gains made in PT and return to prior level of funciton.    Baseline  Pt dependent on proper form and technique for HEP exercises 03/12/18: min cueing required for proper form; 06/03/2018: performs HEP, requires cueing for progression of exercises    Time  4    Period  Weeks    Status  On-going      PT LONG TERM GOAL #2   Title  Pt will report worst pain in past week less than 2/10 in order to return to prior level of function    Baseline  4/10; 03/12/18: 3/10; 06/03/2018: 2/10; 07/30/2018: 1/10    Time  6    Period  Weeks    Status  Achieved      PT LONG TERM GOAL #3   Title  Pt will demonstrate full, pain free, shoulder ROM in order  to reach into cabinets, perform houshold activities and return to prior level of function.    Baseline  flex 100 deg, ABD 75 deg, ER 25, IR 15; 03/12/18: flexion 125, ABD 90 ER/IR deffered to next visit ; 06/03/2018: 155 flexion, abduction 120; ER: 60, IR: 55; 07/30/2018: flexion: 122, abduction 122: ER:40, IR:55     Time  6    Period  Weeks    Status  On-going      PT LONG TERM GOAL #4   Title  Pt will improve FOTO score from 49 to 62 in order to demonstrate an improvement in function.    Baseline  current FOTO: 49; 03/12/18: deferred to next visit; 07/30/2018: 60    Time  6    Period  Weeks    Status  On-going            Plan - 08/25/18 1347    Clinical Impression Statement  Despite previous session being two weeks ago, pt's improvement in right shoulder AROM in flexion and ER has remained. Pt was able to complete strength and endurance exercises in shoulder elevation with no pain. Pt continues to display deficits in right shoulder AROM flexion and ER and decreased shoulder strength. Pt can continue to benefit from skilled PT treatment to improve right shoulder AROM and strength in improved ranges of motion to return to PLOF.    Rehab Potential  Good    Clinical Impairments Affecting Rehab Potential  +motivated    PT Frequency  2x / week    PT Duration  6 weeks    PT Treatment/Interventions  ADLs/Self Care Home Management;Aquatic Therapy;Cryotherapy;Electrical Stimulation;Moist Heat;Ultrasound;Therapeutic activities;Therapeutic exercise;Patient/family education;Manual techniques;Passive range of motion;Dry needling    PT Next Visit Plan  Overhead shoulder elevation strength with adding exercises for HEP. Continue to focus on ER ROM and strength. Assessing Long term goals for POC.    PT Home Exercise Plan  see education section    Consulted and Agree with Plan of Care  Patient       Patient will benefit from skilled therapeutic intervention in order to improve the following deficits and  impairments:  Increased muscle spasms, Decreased range of motion, Impaired UE functional use, Pain, Decreased strength  Visit Diagnosis: Stiffness of right shoulder, not elsewhere classified  Chronic right shoulder pain     Problem List Patient Active Problem List   Diagnosis Date Noted  . Shingles 07/13/2018  . Acute pain of right shoulder 01/06/2018  . Cervical stenosis (uterine cervix) 09/09/2017  . Elevated BP without diagnosis of hypertension 06/06/2017  .  Bilateral lower extremity edema 01/30/2016  . Obesity 12/27/2015  . Frequent headaches 10/27/2015  . Environmental allergies 10/27/2015  . DVT (deep venous thrombosis) ([place]) 10/27/2015  . Depression with anxiety 10/23/2015  . GERD (gastroesophageal reflux disease) 10/23/2015  . Vitamin D deficiency 10/23/2015    Rachael Fee, SPT 08/25/2018, 2:49 PM  [place] [facility] [address] [place], [address] Phone: [phone]   Fax:  [phone]  Name: Michele Long MRN: [id number] Date of Birth: 1951-03-14

## 2018-08-27 ENCOUNTER — Ambulatory Visit: Payer: PPO

## 2018-08-27 DIAGNOSIS — M25611 Stiffness of right shoulder, not elsewhere classified: Secondary | ICD-10-CM

## 2018-08-27 DIAGNOSIS — M25511 Pain in right shoulder: Secondary | ICD-10-CM

## 2018-08-27 DIAGNOSIS — G8929 Other chronic pain: Secondary | ICD-10-CM

## 2018-08-27 NOTE — Therapy (Signed)
[place] [facility] [address], [place], [address] Phone: [phone]   Fax:  [phone]  Physical Therapy Treatment  Patient Details  Name: Michele Long MRN: [id number] Date of Birth: 07/14/1951 No data recorded  Encounter Date: 08/27/2018  PT End of Session - 08/27/18 0858    Visit Number  42    Number of Visits  42    Date for PT Re-Evaluation  08/27/18    PT Start Time  0815    PT Stop Time  0900    PT Time Calculation (min)  45 min    Activity Tolerance  Patient tolerated treatment well    Behavior During Therapy  [facility] for tasks assessed/performed       Past Medical History:  Diagnosis Date  . Allergy   . Depression   . DVT (deep venous thrombosis) ([place])   . Edema   . Frequent headaches   . Gastric ulcer 10/27/2015  . GERD (gastroesophageal reflux disease)   . HTN (hypertension)   . Microalbuminuria   . Migraine   . Monocular diplopia of right eye 10/22/2016  . Shingles   . Symptomatic menopausal or female climacteric states   . UTI (lower urinary tract infection)     Past Surgical History:  Procedure Laterality Date  . AUGMENTATION MAMMAPLASTY Bilateral 1983  . BREAST ENHANCEMENT SURGERY  1983  . NASAL SEPTUM SURGERY  2000    There were no vitals filed for this visit.  Subjective Assessment - 08/27/18 0815    Subjective  Pt reports soreness from previous treatment session but no shoulder pain.     Limitations  House hold activities;Lifting    Patient Stated Goals  get back to everyday tasks, be able to do yardwork and reach up in cabinents    Currently in Pain?  No/denies    Pain Onset  More than a month ago      TREATMENT  THERAPEUTIC EXERCISE Right shoulder ER motion with shoulder at 90 deg abduction: 1x2 min Standing right shoulder ER with abduction: 1x1 min, 1 min at neutral with ER Standing TB (Red) shoulder ER: 3x12 Standing low rows on OMEGA: 1x10, 10 lbs. 2x10, 15 lbs. Lat  pulldown: 3x10, 20 lbs. B Standing shoulder flexion DB raises: 2x12, 3 lbs Spelling alphabet with arm over head to improve overhead strength/endurance: 4x Seated physioball shoulder elevation: 3 min  MANUAL THERAPY 10 minutes  Glenohumeral mobilizations with pt in supine. Inf mobs 3x30 sec grade III and post mobs 3x30 sec grade III with arm in neutral to improve shoulder flexion AROM. STM to teres minor and latissimus dorsi to decrease muscle spasms and improve shoulder AROM.  Therapeutic exercise to improve overhead strength/endurance to improve overhead ADL's. Periscapular strength exercises to improve shoulder strength and shoulder AROM. Manual therapy to decrease muscle spasms and to increase shoulder AROM in flexion and ER.  PT Education - 08/27/18 0834    Education Details  Form/technique with exercise    Person(s) Educated  Patient    Methods  Explanation;Verbal cues    Comprehension  Verbalized understanding;Returned demonstration          PT Long Term Goals - 07/30/18 0942      PT LONG TERM GOAL #1   Title  Pt will be independent and compliant with HEP in order to maintain gains made in PT and return to prior level of funciton.    Baseline  Pt dependent on proper form  and technique for HEP exercises 03/12/18: min cueing required for proper form; 06/03/2018: performs HEP, requires cueing for progression of exercises    Time  4    Period  Weeks    Status  On-going      PT LONG TERM GOAL #2   Title  Pt will report worst pain in past week less than 2/10 in order to return to prior level of function    Baseline  4/10; 03/12/18: 3/10; 06/03/2018: 2/10; 07/30/2018: 1/10    Time  6    Period  Weeks    Status  Achieved      PT LONG TERM GOAL #3   Title  Pt will demonstrate full, pain free, shoulder ROM in order to reach into cabinets, perform houshold activities and return to prior level of function.    Baseline  flex 100 deg, ABD 75 deg, ER 25, IR 15; 03/12/18: flexion 125, ABD 90  ER/IR deffered to next visit ; 06/03/2018: 155 flexion, abduction 120; ER: 60, IR: 55; 07/30/2018: flexion: 122, abduction 122: ER:40, IR:55     Time  6    Period  Weeks    Status  On-going      PT LONG TERM GOAL #4   Title  Pt will improve FOTO score from 49 to 62 in order to demonstrate an improvement in function.    Baseline  current FOTO: 49; 03/12/18: deferred to next visit; 07/30/2018: 60    Time  6    Period  Weeks    Status  On-going            Plan - 08/27/18 0859    Clinical Impression Statement  Pt continues to improve with right shoulder flexion AROM and improving strength of arm overhead. Pt is able to perform increased amount of reps showing carryover from previous sessions. Pt continues to have decreased AROM with right shoulder flexion and ER and strength with overhead motions. Therefore pt can continue to benefit from skilled PT treatment to return pt to PLOF.    Rehab Potential  Good    Clinical Impairments Affecting Rehab Potential  +motivated    PT Frequency  2x / week    PT Duration  6 weeks    PT Treatment/Interventions  ADLs/Self Care Home Management;Aquatic Therapy;Cryotherapy;Electrical Stimulation;Moist Heat;Ultrasound;Therapeutic activities;Therapeutic exercise;Patient/family education;Manual techniques;Passive range of motion;Dry needling    PT Next Visit Plan  Overhead shoulder elevation strength with adding exercises for HEP. Continue to focus on ER ROM and strength. Assessing Long term goals for POC.    PT Home Exercise Plan  see education section    Consulted and Agree with Plan of Care  Patient       Patient will benefit from skilled therapeutic intervention in order to improve the following deficits and impairments:  Increased muscle spasms, Decreased range of motion, Impaired UE functional use, Pain, Decreased strength  Visit Diagnosis: Stiffness of right shoulder, not elsewhere classified  Chronic right shoulder pain     Problem List Patient  Active Problem List   Diagnosis Date Noted  . Shingles 07/13/2018  . Acute pain of right shoulder 01/06/2018  . Cervical stenosis (uterine cervix) 09/09/2017  . Elevated BP without diagnosis of hypertension 06/06/2017  . Bilateral lower extremity edema 01/30/2016  . Obesity 12/27/2015  . Frequent headaches 10/27/2015  . Environmental allergies 10/27/2015  . DVT (deep venous thrombosis) ([place]) 10/27/2015  . Depression with anxiety 10/23/2015  . GERD (gastroesophageal reflux disease) 10/23/2015  . Vitamin  D deficiency 10/23/2015    Rachael Fee, SPT 08/27/2018, 9:04 AM  [place] [facility] [address] [place], [address] Phone: [phone]   Fax:  [phone]  Name: Michele Long MRN: [id number] Date of Birth: [birth year]

## 2018-08-31 ENCOUNTER — Telehealth: Payer: Self-pay | Admitting: Family Medicine

## 2018-08-31 ENCOUNTER — Ambulatory Visit: Payer: PPO | Attending: Family Medicine

## 2018-08-31 DIAGNOSIS — M25611 Stiffness of right shoulder, not elsewhere classified: Secondary | ICD-10-CM | POA: Diagnosis not present

## 2018-08-31 DIAGNOSIS — G8929 Other chronic pain: Secondary | ICD-10-CM | POA: Diagnosis not present

## 2018-08-31 DIAGNOSIS — M25511 Pain in right shoulder: Secondary | ICD-10-CM | POA: Diagnosis not present

## 2018-08-31 NOTE — Telephone Encounter (Signed)
I received an order in the computer to reorder her physical therapy.  Please check with the patient to see if she has been having benefit from the physical therapy.  Please see if she continues to have shoulder pain.  I will reorder the physical therapy though if she is not having any benefit or continues to have shoulder pain she should be reevaluated.

## 2018-08-31 NOTE — Therapy (Signed)
[place] [facility] [address] [place], [address] Phone: [phone]   Fax:  [phone]  Physical Therapy Treatment  Patient Details  Name: Michele Long MRN: [id number] Date of Birth: 07-10-1951 No data recorded  Encounter Date: 08/31/2018  PT End of Session - 08/31/18 1112    Visit Number  43    Number of Visits  51    Date for PT Re-Evaluation  09/28/18    PT Start Time  1030    PT Stop Time  1115    PT Time Calculation (min)  45 min    Activity Tolerance  Patient tolerated treatment well    Behavior During Therapy  [facility] for tasks assessed/performed       Past Medical History:  Diagnosis Date  . Allergy   . Depression   . DVT (deep venous thrombosis) ([place])   . Edema   . Frequent headaches   . Gastric ulcer 10/27/2015  . GERD (gastroesophageal reflux disease)   . HTN (hypertension)   . Microalbuminuria   . Migraine   . Monocular diplopia of right eye 10/22/2016  . Shingles   . Symptomatic menopausal or female climacteric states   . UTI (lower urinary tract infection)     Past Surgical History:  Procedure Laterality Date  . AUGMENTATION MAMMAPLASTY Bilateral 1983  . BREAST ENHANCEMENT SURGERY  1983  . NASAL SEPTUM SURGERY  2000    There were no vitals filed for this visit.  Subjective Assessment - 08/31/18 1057    Subjective  Pt reports worst pain 2/10 NPRS of right shoulder and only with overhead motion. Pt reports no soreness or pain from previous session. Pt states she is "happy" with progress made in PT    Limitations  House hold activities;Lifting    Patient Stated Goals  get back to everyday tasks, be able to do yardwork and reach up in cabinents    Pain Onset  More than a month ago      TREATMENT  THERAPEUTIC EXERCISE Weight passes behind the back: 2 min with 4 lbs DB Standing DB raises in flexion and scaption: 1x 20 each  Standing Scapular rows with TB (Blue): 1x20  Lat pulldown on  OMEGA machine: 1x15, 20 lbs. 1x12, 25 lbs  Standing TB (Green) IR/ER: 2x10 each direction  MANUAL THERAPY:  STM to latissimus dorsi and teres minor with patient in supine to reduce pain, reduce muscle spasms and to improve shoulder AROM. Glenohumeral grade III mobilizations posterior and Inferior 3x30 sec to improve shoulder flexion AROM. 1x30 grade III glenohumeral mobilization with shoulder in ~140 degrees flexion to improve shoulder flexion AROM.    Therapeutic exercises to improve shoulder and periscapular muscle strength and shoulder AROM. Manual therapy to improve shoulder AROM with exercises concentrating on improving strength and mobility in improved range for long term carryover.   PT Education - 08/31/18 1059    Education Details  Form/technique with exericse. Biomechanics of raising shoudler overhead    Person(s) Educated  Patient    Methods  Explanation;Verbal cues    Comprehension  Verbalized understanding;Returned demonstration          PT Long Term Goals - 08/31/18 1117      PT LONG TERM GOAL #1   Title  Pt will be independent and compliant with HEP in order to maintain gains made in PT and return to prior level of funciton.    Baseline  Pt dependent on proper  form and technique for HEP exercises 03/12/18: min cueing required for proper form; 06/03/2018: performs HEP, requires cueing for progression of exercises; 08/31/2018: has not been performing;    Time  4    Period  Weeks    Status  On-going      PT LONG TERM GOAL #2   Title  Pt will report worst pain in past week less than 2/10 in order to return to prior level of function    Baseline  4/10; 03/12/18: 3/10; 06/03/2018: 2/10; 07/30/2018: 1/10;    Time  6    Period  Weeks    Status  Achieved      PT LONG TERM GOAL #3   Title  Pt will demonstrate full, pain free, shoulder ROM in order to reach into cabinets, perform houshold activities and return to prior level of function.    Baseline  flex 100 deg, ABD 75 deg, ER 25,  IR 15; 03/12/18: flexion 125, ABD 90 ER/IR deffered to next visit ; 06/03/2018: 155 flexion, abduction 120; ER: 60, IR: 55; 07/30/2018: flexion: 122, abduction 122: ER:40, IR:55 08/31/2018: Flexion: 150, Abduction:130, ER: 75, IR: 55;    Time  6    Period  Weeks    Status  On-going      PT LONG TERM GOAL #4   Title  Pt will improve FOTO score from 49 to 62 in order to demonstrate an improvement in function.    Baseline  current FOTO: 49; 03/12/18: deferred to next visit; 07/30/2018: 60; 08/31/2018: 63    Time  6    Period  Weeks    Status  Achieved            Plan - 08/31/18 1116    Clinical Impression Statement  Pt continues to improve with PT treatment by accomplishing most long term goals set for therapy. Pt was able to achieve a 1/10 NPRS with functional activities, pt raised her FOTO score to a 63 with a goal of getting it above a 62. Although pt has not returned to full right shoulder AROM, pt has improved in flexion, abduction, and ER improving ADL's with IR staying equal to previous measurement taken. Pt does report not performing HEP. Although pt conitnues to improve with right shoulder AROM and strength with minimal pain, pt can continue to benefit from skilled treatment to return to full right shoulder AROM to return to PLOF.     Rehab Potential  Good    Clinical Impairments Affecting Rehab Potential  +motivated    PT Frequency  2x / week    PT Duration  6 weeks    PT Treatment/Interventions  ADLs/Self Care Home Management;Aquatic Therapy;Cryotherapy;Electrical Stimulation;Moist Heat;Ultrasound;Therapeutic activities;Therapeutic exercise;Patient/family education;Manual techniques;Passive range of motion;Dry needling    PT Next Visit Plan  Overhead shoulder elevation strength with adding exercises for HEP. Continue to focus on ER ROM and strength. Assessing Long term goals for POC.    PT Home Exercise Plan  see education section    Consulted and Agree with Plan of Care  Patient        Patient will benefit from skilled therapeutic intervention in order to improve the following deficits and impairments:  Increased muscle spasms, Decreased range of motion, Impaired UE functional use, Pain, Decreased strength  Visit Diagnosis: Stiffness of right shoulder, not elsewhere classified  Chronic right shoulder pain     Problem List Patient Active Problem List   Diagnosis Date Noted  . Shingles 07/13/2018  . Acute  pain of right shoulder 01/06/2018  . Cervical stenosis (uterine cervix) 09/09/2017  . Elevated BP without diagnosis of hypertension 06/06/2017  . Bilateral lower extremity edema 01/30/2016  . Obesity 12/27/2015  . Frequent headaches 10/27/2015  . Environmental allergies 10/27/2015  . DVT (deep venous thrombosis) ([place]) 10/27/2015  . Depression with anxiety 10/23/2015  . GERD (gastroesophageal reflux disease) 10/23/2015  . Vitamin D deficiency 10/23/2015    Rachael Fee, SPT 08/31/2018, 11:33 AM  [facility] [address] [place], [address] Phone: [phone]   Fax:  [phone]  Name: Latasia Silberstein MRN: [id number] Date of Birth: [birth year]

## 2018-09-01 NOTE — Telephone Encounter (Signed)
Pt returning [place] call. Send to [facility] pool also.

## 2018-09-01 NOTE — Telephone Encounter (Signed)
Patient states she is having benefit- her review is positive. Movement and strength are improving. She is still working on range of motion- and it looks like in a month she should be good.

## 2018-09-01 NOTE — Telephone Encounter (Signed)
Called pt and left a VM to call back. CRM created and sent to [facility] pool. 

## 2018-09-02 NOTE — Telephone Encounter (Signed)
Sent to PCP as an FYI  

## 2018-09-03 ENCOUNTER — Ambulatory Visit: Payer: PPO

## 2018-09-03 DIAGNOSIS — M25611 Stiffness of right shoulder, not elsewhere classified: Secondary | ICD-10-CM | POA: Diagnosis not present

## 2018-09-03 DIAGNOSIS — G8929 Other chronic pain: Secondary | ICD-10-CM

## 2018-09-03 DIAGNOSIS — M25511 Pain in right shoulder: Secondary | ICD-10-CM

## 2018-09-03 NOTE — Therapy (Signed)
[name] [address], [place], [address] Phone: [phone]   Fax:  [phone]  Physical Therapy Treatment  Patient Details  Name: Michele Long MRN: [id number] Date of Birth: 10/09/1950 No data recorded  Encounter Date: 09/03/2018  PT End of Session - 09/03/18 0919    Visit Number  44    Number of Visits  51    Date for PT Re-Evaluation  09/28/18    PT Start Time  0845    PT Stop Time  0930    PT Time Calculation (min)  45 min    Activity Tolerance  Patient tolerated treatment well    Behavior During Therapy  [facility] for tasks assessed/performed       Past Medical History:  Diagnosis Date  . Allergy   . Depression   . DVT (deep venous thrombosis) ([place])   . Edema   . Frequent headaches   . Gastric ulcer 10/27/2015  . GERD (gastroesophageal reflux disease)   . HTN (hypertension)   . Microalbuminuria   . Migraine   . Monocular diplopia of right eye 10/22/2016  . Shingles   . Symptomatic menopausal or female climacteric states   . UTI (lower urinary tract infection)     Past Surgical History:  Procedure Laterality Date  . AUGMENTATION MAMMAPLASTY Bilateral 1983  . BREAST ENHANCEMENT SURGERY  1983  . NASAL SEPTUM SURGERY  2000    There were no vitals filed for this visit.  Subjective Assessment - 09/03/18 0844    Subjective  Pt reports performing HEP of IR towel stretch behind back. Pt reports mild soreness from previous session. No reports of shoulder pain at this time.     Limitations  House hold activities;Lifting    Patient Stated Goals  get back to everyday tasks, be able to do yardwork and reach up in cabinents    Pain Onset  More than a month ago      TREATMENT  THERAPEUTIC EXERCISE  Seated physioball shoulder flexion: 2 min Weight passes behind back to improve shoulder IR: 2 min with 2 lbs  IR towel stretch: 2 min  Standing Shoulder flexion with ER with TB (Red): 2x12  Standing  scapular rows at OMEGA: 2x12, 10 lbs  Standing TB (Green) IR/ER: 2x10 each direction   Lat pulldown: on OMEGA: 2x12, 25 lbs.   Standing DB raises in flexion and scaption: 1x8 each, 5 lbs  MANUAL THERAPY  10 min   STM to pec major, latissimus dorsi, and teres major to decrease muscle spasms and to improve shoulder AROM. Grade IV mobilizations 3x30 sec with pt in supine and shoulder in neutral to improve shoulder flexion AROM. Grade IV mobilizations in shoulder flexion pt in supine 1x30 sec to improve shoulder flexion AROM.  Therapeutic exercises to improve shoulder flexion AROM and strength in end-range motion to improve function with overhead ADL's. Strength and motion exercises for shoulder rotations to improve Dressing and bathing ADL's. Manual therapy to decrease muscle spasms , decrease pain, and to improve shoulder AROM. Pt had increased fatigue after session from increased resistance of exercises.  PT Education - 09/03/18 0848    Education Details  Form/technique with exercise    Person(s) Educated  Patient    Methods  Explanation;Verbal cues    Comprehension  Verbalized understanding;Returned demonstration          PT Long Term Goals - 08/31/18 1117      PT LONG TERM  GOAL #1   Title  Pt will be independent and compliant with HEP in order to maintain gains made in PT and return to prior level of funciton.    Baseline  Pt dependent on proper form and technique for HEP exercises 03/12/18: min cueing required for proper form; 06/03/2018: performs HEP, requires cueing for progression of exercises; 08/31/2018: has not been performing;    Time  4    Period  Weeks    Status  On-going      PT LONG TERM GOAL #2   Title  Pt will report worst pain in past week less than 2/10 in order to return to prior level of function    Baseline  4/10; 03/12/18: 3/10; 06/03/2018: 2/10; 07/30/2018: 1/10;    Time  6    Period  Weeks    Status  Achieved      PT LONG TERM GOAL #3   Title  Pt will demonstrate  full, pain free, shoulder ROM in order to reach into cabinets, perform houshold activities and return to prior level of function.    Baseline  flex 100 deg, ABD 75 deg, ER 25, IR 15; 03/12/18: flexion 125, ABD 90 ER/IR deffered to next visit ; 06/03/2018: 155 flexion, abduction 120; ER: 60, IR: 55; 07/30/2018: flexion: 122, abduction 122: ER:40, IR:55 08/31/2018: Flexion: 150, Abduction:130, ER: 75, IR: 55;    Time  6    Period  Weeks    Status  On-going      PT LONG TERM GOAL #4   Title  Pt will improve FOTO score from 49 to 62 in order to demonstrate an improvement in function.    Baseline  current FOTO: 49; 03/12/18: deferred to next visit; 07/30/2018: 60; 08/31/2018: 63    Time  6    Period  Weeks    Status  Achieved            Plan - 09/03/18 0934    Clinical Impression Statement  Pt continues to improve with shoulder flexion AROM and able to perform exercises at end-range motions with no increases in shoulder pain. Pt was able to tolerate an increase in resitance with all shoulder exercises. Pt can continue to benefit from skilled PT treatment to further improve shoulder AROM and shoulder strength to perform ADL's at PLOF.    Rehab Potential  Good    Clinical Impairments Affecting Rehab Potential  +motivated    PT Frequency  2x / week    PT Duration  6 weeks    PT Treatment/Interventions  ADLs/Self Care Home Management;Aquatic Therapy;Cryotherapy;Electrical Stimulation;Moist Heat;Ultrasound;Therapeutic activities;Therapeutic exercise;Patient/family education;Manual techniques;Passive range of motion;Dry needling    PT Next Visit Plan  Overhead shoulder elevation strength with adding exercises for HEP. Continue to focus on ER ROM and strength. Assessing Long term goals for POC.    PT Home Exercise Plan  see education section    Consulted and Agree with Plan of Care  Patient       Patient will benefit from skilled therapeutic intervention in order to improve the following deficits and  impairments:  Increased muscle spasms, Decreased range of motion, Impaired UE functional use, Pain, Decreased strength  Visit Diagnosis: Stiffness of right shoulder, not elsewhere classified  Chronic right shoulder pain     Problem List Patient Active Problem List   Diagnosis Date Noted  . Shingles 07/13/2018  . Acute pain of right shoulder 01/06/2018  . Cervical stenosis (uterine cervix) 09/09/2017  . Elevated BP without  diagnosis of hypertension 06/06/2017  . Bilateral lower extremity edema 01/30/2016  . Obesity 12/27/2015  . Frequent headaches 10/27/2015  . Environmental allergies 10/27/2015  . DVT (deep venous thrombosis) ([place]) 10/27/2015  . Depression with anxiety 10/23/2015  . GERD (gastroesophageal reflux disease) 10/23/2015  . Vitamin D deficiency 10/23/2015    Rachael Fee, SPT 09/03/2018, 9:43 AM  [facility] [address], [place], [address] Phone: [phone]   Fax:  [phone]  Name: Michele Long MRN: [id number] Date of Birth: 11/21/1950

## 2018-09-07 ENCOUNTER — Ambulatory Visit: Payer: PPO

## 2018-09-10 ENCOUNTER — Ambulatory Visit: Payer: PPO

## 2018-09-11 DIAGNOSIS — J40 Bronchitis, not specified as acute or chronic: Secondary | ICD-10-CM | POA: Diagnosis not present

## 2018-09-11 DIAGNOSIS — J101 Influenza due to other identified influenza virus with other respiratory manifestations: Secondary | ICD-10-CM | POA: Diagnosis not present

## 2018-09-11 DIAGNOSIS — R509 Fever, unspecified: Secondary | ICD-10-CM | POA: Diagnosis not present

## 2018-09-11 DIAGNOSIS — R05 Cough: Secondary | ICD-10-CM | POA: Diagnosis not present

## 2018-09-11 DIAGNOSIS — R6889 Other general symptoms and signs: Secondary | ICD-10-CM | POA: Diagnosis not present

## 2018-09-14 ENCOUNTER — Ambulatory Visit: Payer: PPO

## 2018-09-17 ENCOUNTER — Ambulatory Visit: Payer: PPO

## 2018-09-21 ENCOUNTER — Encounter: Payer: Self-pay | Admitting: Family Medicine

## 2018-09-21 ENCOUNTER — Other Ambulatory Visit: Payer: Self-pay

## 2018-09-21 MED ORDER — PANTOPRAZOLE SODIUM 40 MG PO TBEC: 40.0000 mg | DELAYED_RELEASE_TABLET | Freq: Every day | ORAL | 1 refills | Status: DC

## 2018-09-22 ENCOUNTER — Ambulatory Visit: Payer: PPO

## 2018-09-22 DIAGNOSIS — M25611 Stiffness of right shoulder, not elsewhere classified: Secondary | ICD-10-CM | POA: Diagnosis not present

## 2018-09-22 DIAGNOSIS — G8929 Other chronic pain: Secondary | ICD-10-CM

## 2018-09-22 DIAGNOSIS — M25511 Pain in right shoulder: Secondary | ICD-10-CM

## 2018-09-22 NOTE — Therapy (Signed)
[facility] [address] [place], [address] Phone: [phone]   Fax:  [phone]  Physical Therapy Treatment  Patient Details  Name: Michele Long MRN: [id number] Date of Birth: 1951-06-14 No data recorded  Encounter Date: 09/22/2018  PT End of Session - 09/22/18 1142    Visit Number  45    Number of Visits  51    Date for PT Re-Evaluation  09/28/18    PT Start Time  1125    PT Stop Time  1210    PT Time Calculation (min)  45 min    Activity Tolerance  Patient tolerated treatment well    Behavior During Therapy  [facility] for tasks assessed/performed       Past Medical History:  Diagnosis Date  . Allergy   . Depression   . DVT (deep venous thrombosis) ([place])   . Edema   . Frequent headaches   . Gastric ulcer 10/27/2015  . GERD (gastroesophageal reflux disease)   . HTN (hypertension)   . Microalbuminuria   . Migraine   . Monocular diplopia of right eye 10/22/2016  . Shingles   . Symptomatic menopausal or female climacteric states   . UTI (lower urinary tract infection)     Past Surgical History:  Procedure Laterality Date  . AUGMENTATION MAMMAPLASTY Bilateral 1983  . BREAST ENHANCEMENT SURGERY  1983  . NASAL SEPTUM SURGERY  2000    There were no vitals filed for this visit.  Subjective Assessment - 09/22/18 1128    Subjective  Pt reports having the flu the past ~10 days. Pt reports performing HEP and that she feels she is close to being close to D/C with PT.     Limitations  House hold activities;Lifting    Patient Stated Goals  get back to everyday tasks, be able to do yardwork and reach up in cabinents    Currently in Pain?  No/denies    Pain Onset  More than a month ago      TREATMENT  THERAPEUTIC EXERCISE   Standing shoulder flexion with TB (Red) around wrists: 2x12  Standing towel IR stretch: 3 min   Standing DB shoulder flexion: 2x12, 3 lbs  Standing TB (Blue) shoulder IR/ER: 2x12,  per direction  Standing TB (Green) low row shoulder extension: 2x20  Lat pulldown: 1x20, 20 lbs  MANUAL THERAPY  12 minutes  Pt in supine AP GHJ mobilizations Grade IV 4x30 seconds shoulder in neutral  Pt in supine AP GHJ mobilizations Grade IV 2x30 seconds with shoulder in ~170 degrees of flexion   Pt in supine Inferior GHJ mobilizations Grade IV 2x30 seconds with shoulder in neutral    Therapeutic exercises to improve RTC muscle strength and improving shoulder flexion strength in overhead motions. Manual therapy to improve Shoulder IR and shoulder flexion AROM. Pt displayed fatigue after session due to progression in resistance.  PT Education - 09/22/18 1142    Education Details  Form/technique with exericse.    Person(s) Educated  Patient    Methods  Explanation;Verbal cues    Comprehension  Verbalized understanding;Returned demonstration          PT Long Term Goals - 08/31/18 1117      PT LONG TERM GOAL #1   Title  Pt will be independent and compliant with HEP in order to maintain gains made in PT and return to prior level of funciton.    Baseline  Pt dependent on proper form  and technique for HEP exercises 03/12/18: min cueing required for proper form; 06/03/2018: performs HEP, requires cueing for progression of exercises; 08/31/2018: has not been performing;    Time  4    Period  Weeks    Status  On-going      PT LONG TERM GOAL #2   Title  Pt will report worst pain in past week less than 2/10 in order to return to prior level of function    Baseline  4/10; 03/12/18: 3/10; 06/03/2018: 2/10; 07/30/2018: 1/10;    Time  6    Period  Weeks    Status  Achieved      PT LONG TERM GOAL #3   Title  Pt will demonstrate full, pain free, shoulder ROM in order to reach into cabinets, perform houshold activities and return to prior level of function.    Baseline  flex 100 deg, ABD 75 deg, ER 25, IR 15; 03/12/18: flexion 125, ABD 90 ER/IR deffered to next visit ; 06/03/2018: 155 flexion,  abduction 120; ER: 60, IR: 55; 07/30/2018: flexion: 122, abduction 122: ER:40, IR:55 08/31/2018: Flexion: 150, Abduction:130, ER: 75, IR: 55;    Time  6    Period  Weeks    Status  On-going      PT LONG TERM GOAL #4   Title  Pt will improve FOTO score from 49 to 62 in order to demonstrate an improvement in function.    Baseline  current FOTO: 49; 03/12/18: deferred to next visit; 07/30/2018: 60; 08/31/2018: 63    Time  6    Period  Weeks    Status  Achieved            Plan - 09/22/18 1211    Clinical Impression Statement  Depsite being absent for 2 weeks due to having flu, patient has maintained shoulder AROM gains in a functional capacity. Pt was able to tolerate an increase in resitance of all exercises with no increase in shoulder pain. Pt continues to have decreased shoulder AROM and weakness in maintaining overhead motion and can continue to benefit from skilled PT treatment to return pt to PLOF.    Rehab Potential  Good    Clinical Impairments Affecting Rehab Potential  +motivated    PT Frequency  2x / week    PT Duration  6 weeks    PT Treatment/Interventions  ADLs/Self Care Home Management;Aquatic Therapy;Cryotherapy;Electrical Stimulation;Moist Heat;Ultrasound;Therapeutic activities;Therapeutic exercise;Patient/family education;Manual techniques;Passive range of motion;Dry needling    PT Next Visit Plan  Overhead shoulder elevation strength with adding exercises for HEP. Continue to focus on ER ROM and strength. Assessing Long term goals for POC.    PT Home Exercise Plan  see education section    Consulted and Agree with Plan of Care  Patient       Patient will benefit from skilled therapeutic intervention in order to improve the following deficits and impairments:  Increased muscle spasms, Decreased range of motion, Impaired UE functional use, Pain, Decreased strength  Visit Diagnosis: Stiffness of right shoulder, not elsewhere classified  Chronic right shoulder  pain     Problem List Patient Active Problem List   Diagnosis Date Noted  . Shingles 07/13/2018  . Acute pain of right shoulder 01/06/2018  . Cervical stenosis (uterine cervix) 09/09/2017  . Elevated BP without diagnosis of hypertension 06/06/2017  . Bilateral lower extremity edema 01/30/2016  . Obesity 12/27/2015  . Frequent headaches 10/27/2015  . Environmental allergies 10/27/2015  . DVT (deep venous thrombosis) (  [place]) 10/27/2015  . Depression with anxiety 10/23/2015  . GERD (gastroesophageal reflux disease) 10/23/2015  . Vitamin D deficiency 10/23/2015    Rachael Fee, SPT 09/22/2018, 12:15 PM  [facility] [address], [place], [address] Phone: [phone]   Fax:  [phone]  Name: Alliyah Roesler MRN: [id number] Date of Birth: Jan 16, 1951

## 2018-09-24 ENCOUNTER — Ambulatory Visit: Payer: PPO

## 2018-09-24 DIAGNOSIS — M25611 Stiffness of right shoulder, not elsewhere classified: Secondary | ICD-10-CM

## 2018-09-24 DIAGNOSIS — G8929 Other chronic pain: Secondary | ICD-10-CM

## 2018-09-24 DIAGNOSIS — M25511 Pain in right shoulder: Secondary | ICD-10-CM

## 2018-09-24 NOTE — Therapy (Signed)
[facility] [address], [place], [address] Phone: [phone]   Fax:  [phone]  Physical Therapy Treatment  Patient Details  Name: Michele Long MRN: [id number] Date of Birth: 29-Aug-1950 No data recorded  Encounter Date: 09/24/2018  PT End of Session - 09/24/18 1136    Visit Number  46    Number of Visits  51    Date for PT Re-Evaluation  09/28/18    PT Start Time  1115    PT Stop Time  1200    PT Time Calculation (min)  45 min    Activity Tolerance  Patient tolerated treatment well    Behavior During Therapy  [facility] for tasks assessed/performed       Past Medical History:  Diagnosis Date  . Allergy   . Depression   . DVT (deep venous thrombosis) ([place])   . Edema   . Frequent headaches   . Gastric ulcer 10/27/2015  . GERD (gastroesophageal reflux disease)   . HTN (hypertension)   . Microalbuminuria   . Migraine   . Monocular diplopia of right eye 10/22/2016  . Shingles   . Symptomatic menopausal or female climacteric states   . UTI (lower urinary tract infection)     Past Surgical History:  Procedure Laterality Date  . AUGMENTATION MAMMAPLASTY Bilateral 1983  . BREAST ENHANCEMENT SURGERY  1983  . NASAL SEPTUM SURGERY  2000    There were no vitals filed for this visit.  Subjective Assessment - 09/24/18 1121    Subjective  Pt reports having soreness after previous session Pt states otherwise "feeling good".    Limitations  House hold activities;Lifting    Patient Stated Goals  get back to everyday tasks, be able to do yardwork and reach up in cabinents    Currently in Pain?  No/denies    Pain Onset  More than a month ago     TREATMENT  THERAPEUTIC EXERCISE  Standing shoulder flexion with TB (Red) around wrists: 1x20  Body Blade (Yellow):    Shoulder flexion: 2x1 min   Shoulder ER: 2x1 min  B Standing DB raise: 2x20, 3 lbs  Standing TB (Blue) low row shoulder extension: 2x15  Standing  TB (Black) rows: 2x20  Standing shoulder TB (Blue) IR/ER: 2x12  MANUAL THERAPY             10 minutes             Pt in supine AP GHJ mobilizations Grade IV 4x30 seconds shoulder in neutral             Pt in supine AP GHJ mobilizations Grade IV 3x30 seconds with shoulder in ~170 degrees of flexion              Pt in supine Inferior GHJ mobilizations Grade IV 2x30 seconds with shoulder in neutral  Therapeutic exercises to improve strength and endurance of shoulder in overhead motion to improve overhead ADL's. Strengthening of periscapular muscles to improve shoulder rotation to improve dressing ADL's. Manual therapy to improve shoulder flexion AROM. Pt displayed fatigue of shoulder after treatment due to increase in resistance of exercises.  PT Education - 09/24/18 1134    Education Details  Form/technique with exercise.    Person(s) Educated  Patient    Methods  Explanation;Demonstration;Verbal cues    Comprehension  Verbalized understanding;Returned demonstration          PT Long Term Goals - 08/31/18 1117  PT LONG TERM GOAL #1   Title  Pt will be independent and compliant with HEP in order to maintain gains made in PT and return to prior level of funciton.    Baseline  Pt dependent on proper form and technique for HEP exercises 03/12/18: min cueing required for proper form; 06/03/2018: performs HEP, requires cueing for progression of exercises; 08/31/2018: has not been performing;    Time  4    Period  Weeks    Status  On-going      PT LONG TERM GOAL #2   Title  Pt will report worst pain in past week less than 2/10 in order to return to prior level of function    Baseline  4/10; 03/12/18: 3/10; 06/03/2018: 2/10; 07/30/2018: 1/10;    Time  6    Period  Weeks    Status  Achieved      PT LONG TERM GOAL #3   Title  Pt will demonstrate full, pain free, shoulder ROM in order to reach into cabinets, perform houshold activities and return to prior level of function.    Baseline  flex  100 deg, ABD 75 deg, ER 25, IR 15; 03/12/18: flexion 125, ABD 90 ER/IR deffered to next visit ; 06/03/2018: 155 flexion, abduction 120; ER: 60, IR: 55; 07/30/2018: flexion: 122, abduction 122: ER:40, IR:55 08/31/2018: Flexion: 150, Abduction:130, ER: 75, IR: 55;    Time  6    Period  Weeks    Status  On-going      PT LONG TERM GOAL #4   Title  Pt will improve FOTO score from 49 to 62 in order to demonstrate an improvement in function.    Baseline  current FOTO: 49; 03/12/18: deferred to next visit; 07/30/2018: 60; 08/31/2018: 63    Time  6    Period  Weeks    Status  Achieved            Plan - 09/24/18 1252    Clinical Impression Statement  Pt further increased resistance of periscapular muscles displaying improved muscle strength. Pt continues to display muscular fatigue when performing exercises in overhead motion and decreased shoulder rotation AROM. Pt can continue to benefit from skiled treatment to return to PLOF.    Rehab Potential  Good    Clinical Impairments Affecting Rehab Potential  +motivated    PT Frequency  2x / week    PT Duration  6 weeks    PT Treatment/Interventions  ADLs/Self Care Home Management;Aquatic Therapy;Cryotherapy;Electrical Stimulation;Moist Heat;Ultrasound;Therapeutic activities;Therapeutic exercise;Patient/family education;Manual techniques;Passive range of motion;Dry needling    PT Next Visit Plan  Overhead shoulder elevation strength with adding exercises for HEP. Continue to focus on ER ROM and strength. Assessing Long term goals for POC.    PT Home Exercise Plan  see education section    Consulted and Agree with Plan of Care  Patient       Patient will benefit from skilled therapeutic intervention in order to improve the following deficits and impairments:  Increased muscle spasms, Decreased range of motion, Impaired UE functional use, Pain, Decreased strength  Visit Diagnosis: Stiffness of right shoulder, not elsewhere classified  Chronic right shoulder  pain     Problem List Patient Active Problem List   Diagnosis Date Noted  . Shingles 07/13/2018  . Acute pain of right shoulder 01/06/2018  . Cervical stenosis (uterine cervix) 09/09/2017  . Elevated BP without diagnosis of hypertension 06/06/2017  . Bilateral lower extremity edema 01/30/2016  . Obesity  12/27/2015  . Frequent headaches 10/27/2015  . Environmental allergies 10/27/2015  . DVT (deep venous thrombosis) ([place]) 10/27/2015  . Depression with anxiety 10/23/2015  . GERD (gastroesophageal reflux disease) 10/23/2015  . Vitamin D deficiency 10/23/2015    Rachael Fee, SPT 09/24/2018, 12:58 PM  [facility] [address], [place], [address] Phone: [phone]   Fax:  [phone]  Name: Verdene Creson MRN: [id number] Date of Birth: [birth year]

## 2018-09-26 ENCOUNTER — Other Ambulatory Visit: Payer: Self-pay | Admitting: Family Medicine

## 2018-09-28 ENCOUNTER — Ambulatory Visit: Payer: PPO | Attending: Family Medicine

## 2018-09-28 DIAGNOSIS — M25511 Pain in right shoulder: Secondary | ICD-10-CM | POA: Insufficient documentation

## 2018-09-28 DIAGNOSIS — M25611 Stiffness of right shoulder, not elsewhere classified: Secondary | ICD-10-CM | POA: Diagnosis not present

## 2018-09-28 DIAGNOSIS — G8929 Other chronic pain: Secondary | ICD-10-CM | POA: Diagnosis not present

## 2018-09-28 NOTE — Therapy (Signed)
[facility] [address] [place], [address] Phone: [phone]   Fax:  [phone]  Physical Therapy Treatment  Patient Details  Name: Michele Long MRN: [id number] Date of Birth: Nov 28, 1950 No data recorded  Encounter Date: 09/28/2018  PT End of Session - 09/28/18 0920    Visit Number  47    Number of Visits  51    Date for PT Re-Evaluation  09/28/18    PT Start Time  0900    PT Stop Time  0945    PT Time Calculation (min)  45 min    Activity Tolerance  Patient tolerated treatment well    Behavior During Therapy  [facility] for tasks assessed/performed       Past Medical History:  Diagnosis Date  . Allergy   . Depression   . DVT (deep venous thrombosis) ([place])   . Edema   . Frequent headaches   . Gastric ulcer 10/27/2015  . GERD (gastroesophageal reflux disease)   . HTN (hypertension)   . Microalbuminuria   . Migraine   . Monocular diplopia of right eye 10/22/2016  . Shingles   . Symptomatic menopausal or female climacteric states   . UTI (lower urinary tract infection)     Past Surgical History:  Procedure Laterality Date  . AUGMENTATION MAMMAPLASTY Bilateral 1983  . BREAST ENHANCEMENT SURGERY  1983  . NASAL SEPTUM SURGERY  2000    There were no vitals filed for this visit.  Subjective Assessment - 09/28/18 0910    Subjective  Patient reports she is prepared to discharge after this week. Patient states she has made significant improvement and is going to start at an exercise class.     Limitations  House hold activities;Lifting    Patient Stated Goals  get back to everyday tasks, be able to do yardwork and reach up in cabinents    Currently in Pain?  No/denies    Pain Onset  More than a month ago       TREATMENT Therapeutic Exercise Push up at the wall - x 20  Shoulder ER in sitting - x 20 with GTB Shoulder IR in standing - x 20 GTB  Shoulder rows in sitting - x 20 with GTB  Shoulder  extensions in standing with GTB -x20 Shoulder flexion with RTB around mid forearm for shoulder ER - x 20  Archery with RTB - x 10 B Shoulder flexion with physioball - x 20 Shoulder abduction with physioball at wall - x 20  Pec stretch in corner to improve muscle elasticity - x 20  Straight arm push downs in standing - x 20 at [facility] 15#  Performed shoulder strengthening exercises to both continue to improve shoulder strength to improve shoulder rotators as well as continue to improve AROM in standing positions.    PT Education - 09/28/18 0919    Education Details  form/technique with exercise    Person(s) Educated  Patient    Methods  Explanation;Demonstration    Comprehension  Verbalized understanding;Returned demonstration          PT Long Term Goals - 08/31/18 1117      PT LONG TERM GOAL #1   Title  Pt will be independent and compliant with HEP in order to maintain gains made in PT and return to prior level of funciton.    Baseline  Pt dependent on proper form and technique for HEP exercises 03/12/18: min cueing required for proper  form; 06/03/2018: performs HEP, requires cueing for progression of exercises; 08/31/2018: has not been performing;    Time  4    Period  Weeks    Status  On-going      PT LONG TERM GOAL #2   Title  Pt will report worst pain in past week less than 2/10 in order to return to prior level of function    Baseline  4/10; 03/12/18: 3/10; 06/03/2018: 2/10; 07/30/2018: 1/10;    Time  6    Period  Weeks    Status  Achieved      PT LONG TERM GOAL #3   Title  Pt will demonstrate full, pain free, shoulder ROM in order to reach into cabinets, perform houshold activities and return to prior level of function.    Baseline  flex 100 deg, ABD 75 deg, ER 25, IR 15; 03/12/18: flexion 125, ABD 90 ER/IR deffered to next visit ; 06/03/2018: 155 flexion, abduction 120; ER: 60, IR: 55; 07/30/2018: flexion: 122, abduction 122: ER:40, IR:55 08/31/2018: Flexion: 150, Abduction:130, ER:  75, IR: 55;    Time  6    Period  Weeks    Status  On-going      PT LONG TERM GOAL #4   Title  Pt will improve FOTO score from 49 to 62 in order to demonstrate an improvement in function.    Baseline  current FOTO: 49; 03/12/18: deferred to next visit; 07/30/2018: 60; 08/31/2018: 63    Time  6    Period  Weeks    Status  Achieved            Plan - 09/28/18 7893    Clinical Impression Statement  Patient demonstrates improvement overall with exercises requiring less cueing to perform exercises. Although patient is improving she continues to require cueing for exercise progressions. Patient educated to perform exercises at home and patient will benefit from further skilled therapy to return to prior level of function. Planning to D/C over next visit.     Personal Factors and Comorbidities  Age;Education    Rehab Potential  Good    Clinical Impairments Affecting Rehab Potential  +motivated    PT Frequency  2x / week    PT Duration  6 weeks    PT Treatment/Interventions  ADLs/Self Care Home Management;Aquatic Therapy;Cryotherapy;Electrical Stimulation;Moist Heat;Ultrasound;Therapeutic activities;Therapeutic exercise;Patient/family education;Manual techniques;Passive range of motion;Dry needling    PT Next Visit Plan  Overhead shoulder elevation strength with adding exercises for HEP. Continue to focus on ER ROM and strength. Assessing Long term goals for POC.    PT Home Exercise Plan  see education section    Consulted and Agree with Plan of Care  Patient       Patient will benefit from skilled therapeutic intervention in order to improve the following deficits and impairments:  Increased muscle spasms, Decreased range of motion, Impaired UE functional use, Pain, Decreased strength  Visit Diagnosis: Stiffness of right shoulder, not elsewhere classified  Chronic right shoulder pain     Problem List Patient Active Problem List   Diagnosis Date Noted  . Shingles 07/13/2018  . Acute  pain of right shoulder 01/06/2018  . Cervical stenosis (uterine cervix) 09/09/2017  . Elevated BP without diagnosis of hypertension 06/06/2017  . Bilateral lower extremity edema 01/30/2016  . Obesity 12/27/2015  . Frequent headaches 10/27/2015  . Environmental allergies 10/27/2015  . DVT (deep venous thrombosis) ([place]) 10/27/2015  . Depression with anxiety 10/23/2015  . GERD (gastroesophageal reflux disease)  10/23/2015  . Vitamin D deficiency 10/23/2015    Blythe Stanford, PT DPT 09/28/2018, 9:42 AM  [facility] [address], [place], [address] Phone: [phone]   Fax:  [phone]  Name: Michele Long MRN: [id number] Date of Birth: [birth year]

## 2018-10-01 ENCOUNTER — Ambulatory Visit: Payer: PPO

## 2018-10-01 DIAGNOSIS — M25611 Stiffness of right shoulder, not elsewhere classified: Secondary | ICD-10-CM | POA: Diagnosis not present

## 2018-10-01 DIAGNOSIS — G8929 Other chronic pain: Secondary | ICD-10-CM

## 2018-10-01 DIAGNOSIS — M25511 Pain in right shoulder: Principal | ICD-10-CM

## 2018-10-01 NOTE — Therapy (Signed)
[facility] [address], [place], [address] Phone: [phone]   Fax:  [phone]  Physical Therapy Treatment/Discharge Summary  Patient Details  Name: Michele Long MRN: [id number] Date of Birth: Sep 20, 1950 No data recorded  Patient attended 51 out of 48 visits and has met all long term goals. Patient is to be discharged from PT.  Encounter Date: 10/01/2018  PT End of Session - 10/01/18 0916    Visit Number  48    Number of Visits  51    Date for PT Re-Evaluation  10/01/18    PT Start Time  0900    PT Stop Time  0945    PT Time Calculation (min)  45 min    Activity Tolerance  Patient tolerated treatment well    Behavior During Therapy  [facility] for tasks assessed/performed       Past Medical History:  Diagnosis Date  . Allergy   . Depression   . DVT (deep venous thrombosis) ([place])   . Edema   . Frequent headaches   . Gastric ulcer 10/27/2015  . GERD (gastroesophageal reflux disease)   . HTN (hypertension)   . Microalbuminuria   . Migraine   . Monocular diplopia of right eye 10/22/2016  . Shingles   . Symptomatic menopausal or female climacteric states   . UTI (lower urinary tract infection)     Past Surgical History:  Procedure Laterality Date  . AUGMENTATION MAMMAPLASTY Bilateral 1983  . BREAST ENHANCEMENT SURGERY  1983  . NASAL SEPTUM SURGERY  2000    There were no vitals filed for this visit.  Subjective Assessment - 10/01/18 0908    Subjective  Patient reports she is prepared for  discharge and states her shoulder has improved considerably.     Limitations  House hold activities;Lifting    Patient Stated Goals  get back to everyday tasks, be able to do yardwork and reach up in cabinents    Currently in Pain?  No/denies    Pain Onset  More than a month ago       TREATMENT Therapeutic Exercise Shoulder Extension with Blue TB - x 20 Scapular retraction in standing gray TB - x 20   Shoulder Overhead shoulder adduction - x20  Shoulder B ER with GTB - x 20  Shoulder GTB in sitting - x 20  Standing shoulder IR with GTB - x 20  Shoulder extension GTB - x 20  Shoulder ER in standing with flexion YTB - x 20  Performed exercises today to improve shoulder strengthening and review HEP of exercises to be performed at home.    PT Education - 10/01/18 0911    Education Details  form/technique with exercise    Person(s) Educated  Patient    Methods  Explanation;Demonstration    Comprehension  Verbalized understanding;Returned demonstration          PT Long Term Goals - 10/01/18 1540      PT LONG TERM GOAL #1   Title  Pt will be independent and compliant with HEP in order to maintain gains made in PT and return to prior level of funciton.    Baseline  Pt dependent on proper form and technique for HEP exercises 03/12/18: min cueing required for proper form; 06/03/2018: performs HEP, requires cueing for progression of exercises; 08/31/2018: has not been performing; 10/01/2018: Independent with exercise performance and progression    Time  4    Period  Weeks  Status  Achieved      PT LONG TERM GOAL #2   Title  Pt will report worst pain in past week less than 2/10 in order to return to prior level of function    Baseline  4/10; 03/12/18: 3/10; 06/03/2018: 2/10; 07/30/2018: 1/10;    Time  6    Period  Weeks    Status  Achieved      PT LONG TERM GOAL #3   Title  Pt will demonstrate full, pain free, shoulder ROM in order to reach into cabinets, perform houshold activities and return to prior level of function.    Baseline  flex 100 deg, ABD 75 deg, ER 25, IR 15; 03/12/18: flexion 125, ABD 90 ER/IR deffered to next visit ; 06/03/2018: 155 flexion, abduction 120; ER: 60, IR: 55; 07/30/2018: flexion: 122, abduction 122: ER:40, IR:55 08/31/2018: Flexion: 150, Abduction:130, ER: 75, IR: 55; 10/01/2018: full AROM B    Time  6    Period  Weeks    Status  Achieved      PT LONG TERM GOAL #4    Title  Pt will improve FOTO score from 49 to 62 in order to demonstrate an improvement in function.    Baseline  current FOTO: 49; 03/12/18: deferred to next visit; 07/30/2018: 60; 08/31/2018: 63    Time  6    Period  Weeks    Status  Achieved            Plan - 10/01/18 1779    Clinical Impression Statement  Focused today's session adding exercises to her home program. Patient has either met or partially met all long term goals. Patient demosntrtaes good form with exercises and is independent with exercises requiring little to no cueing for exercise performance. Patient is to be discharged from PT.     Personal Factors and Comorbidities  Age;Education    Rehab Potential  Good    Clinical Impairments Affecting Rehab Potential  +motivated    PT Frequency  2x / week    PT Duration  6 weeks    PT Treatment/Interventions  ADLs/Self Care Home Management;Aquatic Therapy;Cryotherapy;Electrical Stimulation;Moist Heat;Ultrasound;Therapeutic activities;Therapeutic exercise;Patient/family education;Manual techniques;Passive range of motion;Dry needling    PT Next Visit Plan  Overhead shoulder elevation strength with adding exercises for HEP. Continue to focus on ER ROM and strength. Assessing Long term goals for POC.    PT Home Exercise Plan  see education section    Consulted and Agree with Plan of Care  Patient       Patient will benefit from skilled therapeutic intervention in order to improve the following deficits and impairments:  Increased muscle spasms, Decreased range of motion, Impaired UE functional use, Pain, Decreased strength  Visit Diagnosis: Chronic right shoulder pain  Stiffness of right shoulder, not elsewhere classified     Problem List Patient Active Problem List   Diagnosis Date Noted  . Shingles 07/13/2018  . Acute pain of right shoulder 01/06/2018  . Cervical stenosis (uterine cervix) 09/09/2017  . Elevated BP without diagnosis of hypertension 06/06/2017  . Bilateral  lower extremity edema 01/30/2016  . Obesity 12/27/2015  . Frequent headaches 10/27/2015  . Environmental allergies 10/27/2015  . DVT (deep venous thrombosis) ([place]) 10/27/2015  . Depression with anxiety 10/23/2015  . GERD (gastroesophageal reflux disease) 10/23/2015  . Vitamin D deficiency 10/23/2015    Blythe Stanford, PT DPT 10/01/2018, 9:35 AM  [facility] [address]  [place], [place], [address] Phone: [phone]   Fax:  [phone]  Name: Tonee Silverstein MRN: [id number] Date of Birth: Jul 15, 1951

## 2018-10-05 ENCOUNTER — Ambulatory Visit: Payer: PPO

## 2018-10-08 ENCOUNTER — Ambulatory Visit: Payer: PPO

## 2018-10-13 ENCOUNTER — Ambulatory Visit: Payer: PPO

## 2018-10-28 ENCOUNTER — Other Ambulatory Visit: Payer: Self-pay

## 2018-10-28 ENCOUNTER — Telehealth: Payer: Self-pay | Admitting: Family Medicine

## 2018-10-28 ENCOUNTER — Ambulatory Visit: Payer: Self-pay | Admitting: Family Medicine

## 2018-10-28 ENCOUNTER — Encounter: Payer: Self-pay | Admitting: Family Medicine

## 2018-10-28 ENCOUNTER — Ambulatory Visit (INDEPENDENT_AMBULATORY_CARE_PROVIDER_SITE_OTHER): Payer: PPO | Admitting: Family Medicine

## 2018-10-28 DIAGNOSIS — B029 Zoster without complications: Secondary | ICD-10-CM

## 2018-10-28 DIAGNOSIS — K219 Gastro-esophageal reflux disease without esophagitis: Secondary | ICD-10-CM | POA: Diagnosis not present

## 2018-10-28 DIAGNOSIS — F418 Other specified anxiety disorders: Secondary | ICD-10-CM | POA: Diagnosis not present

## 2018-10-28 NOTE — Assessment & Plan Note (Signed)
Overall doing well given the current issues in the world.  She will continue with Celexa and Wellbutrin.  Continue as needed Xanax at night.  She will monitor for drowsiness and not drive if this occurs.  Given return precautions.

## 2018-10-28 NOTE — Telephone Encounter (Signed)
Please contact the patient and get her set up for 6-month follow-up in the office.  Thanks. 

## 2018-10-28 NOTE — Assessment & Plan Note (Signed)
Symptoms have resolved.  Discussed getting the shingles vaccine after widespread coronavirus issues improved.

## 2018-10-28 NOTE — Progress Notes (Signed)
Virtual Visit via Video Note  This visit type was conducted due to national recommendations for restrictions regarding the COVID-19 pandemic (e.g. social distancing).  This format is felt to be most appropriate for this patient at this time.  All issues noted in this document were discussed and addressed.  No physical exam was performed (except for noted visual exam findings with Video Visits).    I connected with on 10/28/18 at 10:30 AM EDT by a video enabled telemedicine application and verified that I am speaking with the correct person using two identifiers. Location patient: home Location provider: work Persons participating in the virtual visit: patient, provider  I discussed the limitations of evaluation and management by telemedicine and the availability of in person appointments. The patient expressed understanding and agreed to proceed.   HPI: Depression/anxiety: Patient notes her anxiety is fairly stable.  It has not been overly excessive with all the stuff that is going on in the world right now.  She does note she occasionally feels depressed on anniversaries of things.  She notes no SI.  She has only taken 10 Xanax since this was last prescribed.  She notes it does make her mildly drowsy the next day though she does not drive when this occurs.  No alcohol intake.  She continues on Wellbutrin and Celexa.  Shingles: Patient reports her shingles pain and rash has completely resolved.  She is had no recurrent issues with this.  She is going to wait until after the coronavirus issue is improving prior to getting her shingles vaccine.  GERD: Taking Protonix daily.  No reflux, abdominal pain, dysphagia, or blood in her stool.   ROS: See pertinent positives and negatives per HPI.  Past Medical History:  Diagnosis Date  . Allergy   . Depression   . DVT (deep venous thrombosis) ([place])   . Edema   . Frequent headaches   . Gastric ulcer 10/27/2015  . GERD (gastroesophageal reflux  disease)   . HTN (hypertension)   . Microalbuminuria   . Migraine   . Monocular diplopia of right eye 10/22/2016  . Shingles   . Symptomatic menopausal or female climacteric states   . UTI (lower urinary tract infection)     Past Surgical History:  Procedure Laterality Date  . AUGMENTATION MAMMAPLASTY Bilateral 1983  . BREAST ENHANCEMENT SURGERY  1983  . NASAL SEPTUM SURGERY  2000    Family History  Problem Relation Age of Onset  . Arthritis Mother   . Hypertension Mother   . Hypertension Father   . Osteoarthritis Father   . Migraines Father   . Cancer Maternal Grandmother   . Diabetes Maternal Grandfather   . Heart disease Maternal Grandfather   . Arthritis Paternal Grandmother   . Cancer Paternal Grandfather        Prostate  . Pancreatic cancer Paternal Grandfather   . Diabetes Brother     SOCIAL HX: Non-smoker.   Current Outpatient Medications:  .  ALPRAZolam (XANAX) 0.5 MG tablet, Take 0.5 tablets (0.25 mg total) by mouth daily as needed for anxiety., Disp: 20 tablet, Rfl: 0 .  buPROPion (WELLBUTRIN XL) 150 MG 24 hr tablet, TAKE 1 TABLET BY MOUTH EVERY DAY, Disp: 90 tablet, Rfl: 1 .  citalopram (CELEXA) 40 MG tablet, TAKE 1 TABLET BY MOUTH EVERY DAY, Disp: 90 tablet, Rfl: 0 .  furosemide (LASIX) 20 MG tablet, Take 1 tablet (20 mg total) by mouth daily., Disp: 90 tablet, Rfl: 1 .  HYDROcodone-acetaminophen (NORCO/VICODIN) 5-325  MG tablet, Take 1 tablet by mouth every 6 (six) hours as needed for moderate pain., Disp: 20 tablet, Rfl: 0 .  ibuprofen (ADVIL,MOTRIN) 600 MG tablet, TAKE 1 TABLET BY MOUTH EVERY 8 HOURS AS NEEDED., Disp: 30 tablet, Rfl: 0 .  pantoprazole (PROTONIX) 40 MG tablet, Take 1 tablet (40 mg total) by mouth daily., Disp: 90 tablet, Rfl: 1 .  silver sulfADIAZINE (SILVADENE) 1 % cream, Apply topically., Disp: , Rfl:   EXAM:  VITALS per patient if applicable: None.  GENERAL: alert, oriented, appears well and in no acute distress  HEENT: atraumatic,  conjunttiva clear, no obvious abnormalities on inspection of external nose and ears  NECK: normal movements of the head and neck  LUNGS: on inspection no signs of respiratory distress, breathing rate appears normal, no obvious gross SOB, gasping or wheezing  CV: no obvious cyanosis  MS: moves all visible extremities without noticeable abnormality  PSYCH/NEURO: pleasant and cooperative, no obvious depression or anxiety, speech and thought processing grossly intact  ASSESSMENT AND PLAN:  Discussed the following assessment and plan:  Gastroesophageal reflux disease, esophagitis presence not specified  Depression with anxiety  Herpes zoster without complication  GERD (gastroesophageal reflux disease) Asymptomatic.  Continue Protonix.  Depression with anxiety Overall doing well given the current issues in the world.  She will continue with Celexa and Wellbutrin.  Continue as needed Xanax at night.  She will monitor for drowsiness and not drive if this occurs.  Given return precautions.  Shingles Symptoms have resolved.  Discussed getting the shingles vaccine after widespread coronavirus issues improved.  Discussed coronavirus hygiene and social distancing precautions.   I discussed the assessment and treatment plan with the patient. The patient was provided an opportunity to ask questions and all were answered. The patient agreed with the plan and demonstrated an understanding of the instructions.   The patient was advised to call back or seek an in-person evaluation if the symptoms worsen or if the condition fails to improve as anticipated.    Tommi Rumps, MD

## 2018-10-28 NOTE — Telephone Encounter (Signed)
Called and spoke with Pt scheduled her appt for 03/31/2019 @ 10:30 am

## 2018-10-28 NOTE — Assessment & Plan Note (Signed)
Asymptomatic. Continue Protonix. 

## 2018-11-04 ENCOUNTER — Other Ambulatory Visit: Payer: Self-pay | Admitting: Family Medicine

## 2018-12-09 ENCOUNTER — Other Ambulatory Visit: Payer: Self-pay | Admitting: Family Medicine

## 2018-12-09 MED ORDER — FUROSEMIDE 20 MG PO TABS: 20.0000 mg | ORAL_TABLET | Freq: Every day | ORAL | 1 refills | Status: DC

## 2018-12-09 NOTE — Telephone Encounter (Signed)
Refill sent as requested. 

## 2018-12-09 NOTE — Telephone Encounter (Signed)
Copied from [place] [id number]. Topic: Quick Communication - Rx Refill/Question >> Dec 09, 2018 11:37 AM Keene Breath wrote: Medication: furosemide (LASIX) 20 MG tablet  Patient called to request a refill for the above medication  Preferred Pharmacy (with phone number or street name): [id number] - [place], [place] [phone] (Phone) [phone] (Fax)

## 2019-02-04 ENCOUNTER — Other Ambulatory Visit: Payer: Self-pay | Admitting: Family Medicine

## 2019-02-10 ENCOUNTER — Ambulatory Visit (INDEPENDENT_AMBULATORY_CARE_PROVIDER_SITE_OTHER): Payer: PPO

## 2019-02-10 ENCOUNTER — Other Ambulatory Visit: Payer: Self-pay

## 2019-02-10 DIAGNOSIS — E2839 Other primary ovarian failure: Secondary | ICD-10-CM | POA: Diagnosis not present

## 2019-02-10 DIAGNOSIS — Z0000 Encounter for general adult medical examination without abnormal findings: Secondary | ICD-10-CM

## 2019-02-10 DIAGNOSIS — Z1239 Encounter for other screening for malignant neoplasm of breast: Secondary | ICD-10-CM

## 2019-02-10 NOTE — Progress Notes (Signed)
Subjective:   Michele Long is a 68 y.o. female who presents for an Initial Medicare Annual Wellness Visit.  Review of Systems    No ROS.  Medicare Wellness Virtual Visit.  Visual/audio telehealth visit, UTA vital signs.   See social history for additional risk factors.    Cardiac Risk Factors include: advanced age (>89men, >76 women);hypertension     Objective:    Today's Vitals   There is no height or weight on file to calculate BMI.  Advanced Directives 02/10/2019 01/28/2018  Does Patient Have a Medical Advance Directive? No No  Would patient like information on creating a medical advance directive? Yes (MAU/Ambulatory/Procedural Areas - Information given) -    Current Medications (verified) Outpatient Encounter Medications as of 02/10/2019  Medication Sig  . ALPRAZolam (XANAX) 0.5 MG tablet Take 0.5 tablets (0.25 mg total) by mouth daily as needed for anxiety.  Marland Kitchen buPROPion (WELLBUTRIN XL) 150 MG 24 hr tablet TAKE 1 TABLET BY MOUTH EVERY DAY  . citalopram (CELEXA) 40 MG tablet TAKE 1 TABLET BY MOUTH EVERY DAY  . furosemide (LASIX) 20 MG tablet Take 1 tablet (20 mg total) by mouth daily.  Marland Kitchen HYDROcodone-acetaminophen (NORCO/VICODIN) 5-325 MG tablet Take 1 tablet by mouth every 6 (six) hours as needed for moderate pain.  Marland Kitchen ibuprofen (ADVIL,MOTRIN) 600 MG tablet TAKE 1 TABLET BY MOUTH EVERY 8 HOURS AS NEEDED.  Marland Kitchen pantoprazole (PROTONIX) 40 MG tablet Take 1 tablet (40 mg total) by mouth daily.  . silver sulfADIAZINE (SILVADENE) 1 % cream Apply topically.   No facility-administered encounter medications on file as of 02/10/2019.     Allergies (verified) Erythromycin and Sulfa antibiotics   History: Past Medical History:  Diagnosis Date  . Allergy   . Depression   . DVT (deep venous thrombosis) ([place])   . Edema   . Frequent headaches   . Gastric ulcer 10/27/2015  . GERD (gastroesophageal reflux disease)   . HTN (hypertension)   . Microalbuminuria   . Migraine   .  Monocular diplopia of right eye 10/22/2016  . Shingles   . Symptomatic menopausal or female climacteric states   . UTI (lower urinary tract infection)    Past Surgical History:  Procedure Laterality Date  . AUGMENTATION MAMMAPLASTY Bilateral 1983  . BREAST ENHANCEMENT SURGERY  1983  . NASAL SEPTUM SURGERY  2000   Family History  Problem Relation Age of Onset  . Arthritis Mother   . Hypertension Mother   . Hypertension Father   . Osteoarthritis Father   . Migraines Father   . Cancer Maternal Grandmother   . Diabetes Maternal Grandfather   . Heart disease Maternal Grandfather   . Arthritis Paternal Grandmother   . Cancer Paternal Grandfather        Prostate  . Pancreatic cancer Paternal Grandfather   . Diabetes Brother    Social History   Socioeconomic History  . Marital status: Widowed    Spouse name: Not on file  . Number of children: Not on file  . Years of education: Not on file  . Highest education level: Not on file  Occupational History  . Not on file  Social Needs  . Financial resource strain: Not hard at all  . Food insecurity    Worry: Never true    Inability: Never true  . Transportation needs    Medical: No    Non-medical: No  Tobacco Use  . Smoking status: Never Smoker  . Smokeless tobacco: Never Used  Substance  and Sexual Activity  . Alcohol use: No    Alcohol/week: 0.0 standard drinks  . Drug use: No  . Sexual activity: Never  Lifestyle  . Physical activity    Days per week: Not on file    Minutes per session: Not on file  . Stress: Not at all  Relationships  . Social Herbalist on phone: Not on file    Gets together: Not on file    Attends religious service: Not on file    Active member of club or organization: Not on file    Attends meetings of clubs or organizations: Not on file    Relationship status: Not on file  Other Topics Concern  . Not on file  Social History Narrative   2 Children (1 daughter 53) and 1 son passed  away at 7 months    2 dogs    Caffeine 1 cup coffee   Widower    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     Tobacco Counseling Counseling given: Not Answered   Clinical Intake:  Pre-visit preparation completed: Yes        Diabetes: No  How often do you need to have someone help you when you read instructions, pamphlets, or other written materials from your doctor or pharmacy?: 1 - Never  Interpreter Needed?: No      Activities of Daily Living In your present state of health, do you have any difficulty performing the following activities: 02/10/2019  Hearing? Y  Comment Hearing aids  Vision? N  Difficulty concentrating or making decisions? N  Walking or climbing stairs? N  Dressing or bathing? N  Doing errands, shopping? N  Preparing Food and eating ? N  Using the Toilet? N  In the past six months, have you accidently leaked urine? Y  Comment Managed with poise pad  Do you have problems with loss of bowel control? N  Managing your Medications? N  Managing your Finances? N  Housekeeping or managing your Housekeeping? N  Some recent data might be hidden     Immunizations and Health Maintenance Immunization History  Administered Date(s) Administered  . Influenza, High Dose Seasonal PF 06/06/2017, 04/22/2018  . Influenza,inj,Quad PF,6+ Mos 06/12/2016  . Pneumococcal Conjugate-13 05/14/2018   Health Maintenance Due  Topic Date Due  . TETANUS/TDAP  05/12/1971  . DEXA SCAN  05/11/2017  . MAMMOGRAM  10/15/2018    Patient Care Team: Leone Haven, MD as PCP - General (Family Medicine)  Indicate any recent Medical Services you may have received from other than [facility] providers in the past year (date may be approximate).     Assessment:   This is a routine wellness examination for [place].  I connected with patient 02/10/19 at 12:00 PM EDT by an audio enabled telemedicine application and verified that I am speaking with the correct person using two identifiers.  Patient stated full name and DOB. Patient gave permission to continue with virtual visit. Patient's location was at home and Nurse's location was at [place] office.   Health Screenings  Mammogram - 09/2017; discussed; ordered today.   Colonoscopy - 05/2012 Bone Density - discussed; ordered today.  Glaucoma -none Hearing -hearing aids Hemoglobin A1C - 03/2018 (6.0) Cholesterol - 08/2017 Dental- she plans to schedule Vision- she plans to schedule   Social  Alcohol intake - no         Smoking history- never    Smokers in home? none Illicit drug use? none  Physical activity- yard work, stair climbing x10 daily, upper body stretching Diet - healthy Sexually Active -never BMI- discussed the importance of a healthy diet, water intake and the benefits of aerobic exercise.  Educational material provided.   Safety  Patient feels safe at home- yes Patient does have smoke detectors at home- yes Patient does wear sunscreen or protective clothing when in direct sunlight -yes Patient does wear seat belt when in a moving vehicle -yes Patient drives- yes  [id number] precautions and sickness symptoms discussed.   Activities of Daily Living Patient denies needing assistance with: driving, household chores, feeding themselves, getting from bed to chair, getting to the toilet, bathing/showering, dressing, managing money, or preparing meals.  No new identified risk were noted.    Depression Screen Reports as it is improving.   Medication-taking as directed and without issues.   Fall Screen Patient denies being afraid of falling or falling in the last year.  Adequate lighting in walkways- yes Handrails in use when climbing stair- yes Hallways free of throw rugs, electrical cords etc- yes  Memory Screen Patient is alert.  Patient denies difficulty focusing, concentrating or misplacing items. Correctly identified the president of the [place], season and recall. Patient likes to read, sew, and play  computer games for brain stimulation.  Immunizations The following Immunizations were discussed: Influenza, shingles, pneumonia, and tetanus.   Other Providers Patient Care Team: Leone Haven, MD as PCP - General (Family Medicine)  Hearing/Vision screen  Hearing Screening   125Hz  250Hz  500Hz  1000Hz  2000Hz  3000Hz  4000Hz  6000Hz  8000Hz   Right ear:           Left ear:           Comments: Hearing aids  Vision Screening Comments: Wears corrective lenses Visual acuity not assessed, virtual visit.      Dietary issues and exercise activities discussed: Current Exercise Habits: Home exercise routine, Time (Minutes): 20, Frequency (Times/Week): 4, Weekly Exercise (Minutes/Week): 80, Intensity: Moderate  Goals      Patient Stated   . Follow up with Primary Care Provider (pt-stated)     Schedule routine eye, dental, mammogram and bone density exams.  Keep all routine maintenance appointments.  Follow up with doctor as needed and directed.       Depression Screen PHQ 2/9 Scores 02/10/2019 07/13/2018 01/30/2016 12/27/2015  PHQ - 2 Score - - 1 3  PHQ- 9 Score - - 6 15  Exception Documentation Medical reason Medical reason - -    Fall Risk Fall Risk  02/10/2019 07/13/2018 01/06/2018 09/09/2017  Falls in the past year? 0 0 No Yes  Number falls in past yr: - 0 - 1  Injury with Fall? - 0 - No   Cognitive Function:     6CIT Screen 02/10/2019  What Year? 0 points  What month? 0 points  What time? 0 points  Count back from 20 0 points  Months in reverse 0 points  Repeat phrase 0 points  Total Score 0    Screening Tests Health Maintenance  Topic Date Due  . TETANUS/TDAP  05/12/1971  . DEXA SCAN  05/11/2017  . MAMMOGRAM  10/15/2018  . INFLUENZA VACCINE  02/27/2019  . PNA vac Low Risk Adult (2 of 2 - PPSV23) 05/15/2019  . COLONOSCOPY  06/09/2022  . Hepatitis C Screening  Completed     Plan:    End of life planning; Advance aging; Advanced directives discussed.  Copy of  current HCPOA/Living Will requested once completed.  Mailed  per request.   I have personally reviewed and noted the following in the patient's chart:   . Medical and social history . Use of alcohol, tobacco or illicit drugs  . Current medications and supplements . Functional ability and status . Nutritional status . Physical activity . Advanced directives . List of other physicians . Hospitalizations, surgeries, and ER visits in previous 12 months . Vitals . Screenings to include cognitive, depression, and falls . Referrals and appointments  In addition, I have reviewed and discussed with patient certain preventive protocols, quality metrics, and best practice recommendations. A written personalized care plan for preventive services as well as general preventive health recommendations were provided to patient.     Varney Biles, LPN   [id number]

## 2019-02-10 NOTE — Patient Instructions (Addendum)
Michele Long , Thank you for taking time to come for your Medicare Wellness Visit. I appreciate your ongoing commitment to your health goals. Please review the following plan we discussed and let me know if I can assist you in the future.   These are the goals we discussed: Goals      Patient Stated   . Follow up with Primary Care Provider (pt-stated)     Schedule routine eye, dental, mammogram and bone density exams.  Keep all routine maintenance appointments.  Follow up with doctor as needed and directed.        This is a list of the screening recommended for you and due dates:  Health Maintenance  Topic Date Due  . Tetanus Vaccine  05/12/1971  . DEXA scan (bone density measurement)  05/11/2017  . Mammogram  10/15/2018  . Flu Shot  02/27/2019  . Pneumonia vaccines (2 of 2 - PPSV23) 05/15/2019  . Colon Cancer Screening  06/09/2022  .  Hepatitis C: One time screening is recommended by Center for Disease Control  (CDC) for  adults born from [birth year] through 1964.   Completed    Bone Density Test The bone density test uses a special type of X-ray to measure the amount of calcium and other minerals in your bones. It can measure bone density in the hip and the spine. The test procedure is similar to having a regular X-ray. This test may also be called:  Bone densitometry.  Bone mineral density test.  Dual-energy X-ray absorptiometry (DEXA). You may have this test to:  Diagnose a condition that causes weak or thin bones (osteoporosis).  Screen you for osteoporosis.  Predict your risk for a broken bone (fracture).  Determine how well your osteoporosis treatment is working. Tell a health care provider about:  Any allergies you have.  All medicines you are taking, including vitamins, herbs, eye drops, creams, and over-the-counter medicines.  Any problems you or family members have had with anesthetic medicines.  Any blood disorders you have.  Any surgeries you have had.  Any  medical conditions you have.  Whether you are pregnant or may be pregnant.  Any medical tests you have had within the past 14 days that used contrast material. What are the risks? Generally, this is a safe procedure. However, it does expose you to a small amount of radiation, which can slightly increase your cancer risk. What happens before the procedure?  Do not take any calcium supplements starting 24 hours before your test.  Remove all metal jewelry, eyeglasses, dental appliances, and any other metal objects. What happens during the procedure?   You will lie down on an exam table. There will be an X-ray generator below you and an imaging device above you.  Other devices, such as boxes or braces, may be used to position your body properly for the scan.  The machine will slowly scan your body. You will need to keep still.  The images will show up on a screen in the room. Images will be examined by a specialist after your test is done. The procedure may vary among health care providers and hospitals. What happens after the procedure?  It is up to you to get your test results. Ask your health care provider, or the department that is doing the test, when your results will be ready. Summary  A bone density test is an imaging test that uses a type of X-ray to measure the amount of calcium and other minerals  in your bones.  The test may be used to diagnose or screen you for a condition that causes weak or thin bones (osteoporosis), predict your risk for a broken bone (fracture), or determine how well your osteoporosis treatment is working.  Do not take any calcium supplements starting 24 hours before your test.  Ask your health care provider, or the department that is doing the test, when your results will be ready. This information is not intended to replace advice given to you by your health care provider. Make sure you discuss any questions you have with your health care  provider. Document Released: 08/06/2004 Document Revised: 07/31/2017 Document Reviewed: 05/19/2017 Elsevier Patient Education  [place] A mammogram is a low energy X-ray of the breasts that is done to check for abnormal changes. This procedure can screen for and detect any changes that may indicate breast cancer. Mammograms are regularly done on women. A man may have a mammogram if he has a lump or swelling in his breast. A mammogram can also identify other changes and variations in the breast, such as:  Inflammation of the breast tissue (mastitis).  An infected area that contains a collection of pus (abscess).  A fluid-filled sac (cyst).  Fibrocystic changes. This is when breast tissue becomes denser, which can make the tissue feel rope-like or uneven under the skin.  Tumors that are not cancerous (benign). Tell a health care provider:  About any allergies you have.  If you have breast implants.  If you have had previous breast disease, biopsy, or surgery.  If you are breastfeeding.  If you are younger than age 54.  If you have a family history of breast cancer.  Whether you are pregnant or may be pregnant. What are the risks? Generally, this is a safe procedure. However, problems may occur, including:  Exposure to radiation. Radiation levels are very low with this test.  The results being misinterpreted.  The need for further tests.  The inability of the mammogram to detect certain cancers. What happens before the procedure?  Schedule your test about 1-2 weeks after your menstrual period if you are still menstruating. This is usually when your breasts are the least tender.  If you have had a mammogram done at a different facility in the past, get the mammogram X-rays or have them sent to your current exam facility. The new and old images will be compared.  Wash your breasts and underarms on the day of the test.  Do not wear deodorants,  perfumes, lotions, or powders anywhere on your body on the day of the test.  Remove any jewelry from your neck.  Wear clothes that you can change into and out of easily. What happens during the procedure?   You will undress from the waist up and put on a gown that opens in the front.  You will stand in front of the X-ray machine.  Each breast will be placed between two plastic or glass plates. The plates will compress your breast for a few seconds. Try to stay as relaxed as possible during the procedure. This does not cause any harm to your breasts and any discomfort you feel will be very brief.  X-rays will be taken from different angles of each breast. The procedure may vary among health care providers and hospitals. What happens after the procedure?  The mammogram will be examined by a specialist (radiologist).  You may need to repeat certain parts of the test,  depending on the quality of the images. This is commonly done if the radiologist needs a better view of the breast tissue.  You may resume your normal activities.  It is up to you to get the results of your procedure. Ask your health care provider, or the department that is doing the procedure, when your results will be ready. Summary  A mammogram is a low energy X-ray of the breasts that is done to check for abnormal changes. A man may have a mammogram if he has a lump or swelling in his breast.  If you have had a mammogram done at a different facility in the past, get the mammogram X-rays or have them sent to your current exam facility in order to compare them.  Schedule your test about 1-2 weeks after your menstrual period if you are still menstruating.  For this test, each breast will be placed between two plastic or glass plates. The plates will compress your breast for a few seconds.  Ask when your test results will be ready. Make sure you get your test results. This information is not intended to replace advice  given to you by your health care provider. Make sure you discuss any questions you have with your health care provider. Document Released: 07/12/2000 Document Revised: 03/05/2018 Document Reviewed: 03/05/2018 Elsevier Patient Education  2020 [employer].

## 2019-02-14 NOTE — Progress Notes (Signed)
I have reviewed the above note and agree.  Zanai Mallari, M.D.  

## 2019-02-24 ENCOUNTER — Encounter: Payer: Self-pay | Admitting: Family Medicine

## 2019-03-23 ENCOUNTER — Other Ambulatory Visit: Payer: Self-pay | Admitting: Family Medicine

## 2019-03-31 ENCOUNTER — Ambulatory Visit: Payer: Self-pay | Admitting: Family Medicine

## 2019-07-14 ENCOUNTER — Other Ambulatory Visit: Payer: Self-pay | Admitting: Family Medicine

## 2019-07-18 NOTE — Telephone Encounter (Signed)
Patient has not been seen since April 2020. She needs to schedule a follow-up to discuss the xanax refill. Thanks.

## 2019-08-09 ENCOUNTER — Other Ambulatory Visit: Payer: Self-pay | Admitting: Family Medicine

## 2019-08-11 NOTE — Telephone Encounter (Signed)
Patient needs to be seen to determine if refilling this is appropriate. Please get her scheduled for a follow-up. Thanks.

## 2019-08-11 NOTE — Telephone Encounter (Signed)
Refilled: 07/13/2018 Last OV: 10/28/2018 Next OV: not available

## 2019-10-05 ENCOUNTER — Other Ambulatory Visit: Payer: Self-pay | Admitting: Family Medicine

## 2019-11-02 ENCOUNTER — Other Ambulatory Visit: Payer: Self-pay | Admitting: Family Medicine

## 2020-01-27 DIAGNOSIS — H0288B Meibomian gland dysfunction left eye, upper and lower eyelids: Secondary | ICD-10-CM | POA: Diagnosis not present

## 2020-01-27 DIAGNOSIS — H43392 Other vitreous opacities, left eye: Secondary | ICD-10-CM | POA: Diagnosis not present

## 2020-01-27 DIAGNOSIS — H0288A Meibomian gland dysfunction right eye, upper and lower eyelids: Secondary | ICD-10-CM | POA: Diagnosis not present

## 2020-01-27 DIAGNOSIS — H2513 Age-related nuclear cataract, bilateral: Secondary | ICD-10-CM | POA: Diagnosis not present

## 2020-02-01 ENCOUNTER — Other Ambulatory Visit: Payer: Self-pay | Admitting: Family Medicine

## 2020-02-11 ENCOUNTER — Ambulatory Visit (INDEPENDENT_AMBULATORY_CARE_PROVIDER_SITE_OTHER): Payer: PPO

## 2020-02-11 VITALS — Ht 66.0 in | Wt 221.0 lb

## 2020-02-11 DIAGNOSIS — Z0000 Encounter for general adult medical examination without abnormal findings: Secondary | ICD-10-CM | POA: Diagnosis not present

## 2020-02-11 NOTE — Patient Instructions (Addendum)
Michele Long , Thank you for taking time to come for your Medicare Wellness Visit. I appreciate your ongoing commitment to your health goals. Please review the following plan we discussed and let me know if I can assist you in the future.   These are the goals we discussed: Goals      Patient Stated   .  Follow up with Primary Care Provider (pt-stated)      Follow up with doctor as needed and directed.  Schedule: Mammogram, bone density.     .  I can walk more for exercise (pt-stated)       This is a list of the screening recommended for you and due dates:  Health Maintenance  Topic Date Due  . COVID-19 Vaccine (1) 02/27/2020*  . Mammogram  02/10/2021*  . DEXA scan (bone density measurement)  02/10/2021*  . Tetanus Vaccine  02/10/2021*  . Pneumonia vaccines (2 of 2 - PPSV23) 02/10/2021*  . Flu Shot  02/27/2020  . Colon Cancer Screening  06/09/2022  .  Hepatitis C: One time screening is recommended by Center for Disease Control  (CDC) for  adults born from [birth year] through 1964.   Completed  *Topic was postponed. The date shown is not the original due date.   Immunizations Immunization History  Administered Date(s) Administered  . Influenza, High Dose Seasonal PF 06/06/2017, 04/22/2018  . Influenza,inj,Quad PF,6+ Mos 06/12/2016  . Pneumococcal Conjugate-13 05/14/2018   Keep all routine maintenance appointments.   Schedule mammogram and bopne density  Conditions/risks identified: none new  Follow up in one year for your annual wellness visit   Educational information re: mammogram, bone density, tdap, pneumococcal, covid precautions and brain health exercises mailed per patient request.    Preventive Care 68 Years and Older, Female Preventive care refers to lifestyle choices and visits with your health care provider that can promote health and wellness. What does preventive care include?  A yearly physical exam. This is also called an annual well check.  Dental exams once  or twice a year.  Routine eye exams. Ask your health care provider how often you should have your eyes checked.  Personal lifestyle choices, including:  Daily care of your teeth and gums.  Regular physical activity.  Eating a healthy diet.  Avoiding tobacco and drug use.  Limiting alcohol use.  Practicing safe sex.  Taking low-dose aspirin every day.  Taking vitamin and mineral supplements as recommended by your health care provider. What happens during an annual well check? The services and screenings done by your health care provider during your annual well check will depend on your age, overall health, lifestyle risk factors, and family history of disease. Counseling  Your health care provider may ask you questions about your:  Alcohol use.  Tobacco use.  Drug use.  Emotional well-being.  Home and relationship well-being.  Sexual activity.  Eating habits.  History of falls.  Memory and ability to understand (cognition).  Work and work Statistician.  Reproductive health. Screening  You may have the following tests or measurements:  Height, weight, and BMI.  Blood pressure.  Lipid and cholesterol levels. These may be checked every 5 years, or more frequently if you are over 68 years old.  Skin check.  Lung cancer screening. You may have this screening every year starting at age 14 if you have a 30-pack-year history of smoking and currently smoke or have quit within the past 15 years.  Fecal occult blood test (FOBT) of the  stool. You may have this test every year starting at age 6.  Flexible sigmoidoscopy or colonoscopy. You may have a sigmoidoscopy every 5 years or a colonoscopy every 10 years starting at age 68.  Hepatitis C blood test.  Hepatitis B blood test.  Sexually transmitted disease (STD) testing.  Diabetes screening. This is done by checking your blood sugar (glucose) after you have not eaten for a while (fasting). You may have this  done every 1-3 years.  Bone density scan. This is done to screen for osteoporosis. You may have this done starting at age 20.  Mammogram. This may be done every 1-2 years. Talk to your health care provider about how often you should have regular mammograms. Talk with your health care provider about your test results, treatment options, and if necessary, the need for more tests. Vaccines  Your health care provider may recommend certain vaccines, such as:  Influenza vaccine. This is recommended every year.  Tetanus, diphtheria, and acellular pertussis (Tdap, Td) vaccine. You may need a Td booster every 10 years.  Zoster vaccine. You may need this after age 37.  Pneumococcal 13-valent conjugate (PCV13) vaccine. One dose is recommended after age 34.  Pneumococcal polysaccharide (PPSV23) vaccine. One dose is recommended after age 29. Talk to your health care provider about which screenings and vaccines you need and how often you need them. This information is not intended to replace advice given to you by your health care provider. Make sure you discuss any questions you have with your health care provider. Document Released: 08/11/2015 Document Revised: 04/03/2016 Document Reviewed: 05/16/2015 Elsevier Interactive Patient Education  2017 [place] Prevention in the Home Falls can cause injuries. They can happen to people of all ages. There are many things you can do to make your home safe and to help prevent falls. What can I do on the outside of my home?  Regularly fix the edges of walkways and driveways and fix any cracks.  Remove anything that might make you trip as you walk through a door, such as a raised step or threshold.  Trim any bushes or trees on the path to your home.  Use bright outdoor lighting.  Clear any walking paths of anything that might make someone trip, such as rocks or tools.  Regularly check to see if handrails are loose or broken. Make sure that  both sides of any steps have handrails.  Any raised decks and porches should have guardrails on the edges.  Have any leaves, snow, or ice cleared regularly.  Use sand or salt on walking paths during winter.  Clean up any spills in your garage right away. This includes oil or grease spills. What can I do in the bathroom?  Use night lights.  Install grab bars by the toilet and in the tub and shower. Do not use towel bars as grab bars.  Use non-skid mats or decals in the tub or shower.  If you need to sit down in the shower, use a plastic, non-slip stool.  Keep the floor dry. Clean up any water that spills on the floor as soon as it happens.  Remove soap buildup in the tub or shower regularly.  Attach bath mats securely with double-sided non-slip rug tape.  Do not have throw rugs and other things on the floor that can make you trip. What can I do in the bedroom?  Use night lights.  Make sure that you have a light by your bed  that is easy to reach.  Do not use any sheets or blankets that are too big for your bed. They should not hang down onto the floor.  Have a firm chair that has side arms. You can use this for support while you get dressed.  Do not have throw rugs and other things on the floor that can make you trip. What can I do in the kitchen?  Clean up any spills right away.  Avoid walking on wet floors.  Keep items that you use a lot in easy-to-reach places.  If you need to reach something above you, use a strong step stool that has a grab bar.  Keep electrical cords out of the way.  Do not use floor polish or wax that makes floors slippery. If you must use wax, use non-skid floor wax.  Do not have throw rugs and other things on the floor that can make you trip. What can I do with my stairs?  Do not leave any items on the stairs.  Make sure that there are handrails on both sides of the stairs and use them. Fix handrails that are broken or loose. Make sure  that handrails are as long as the stairways.  Check any carpeting to make sure that it is firmly attached to the stairs. Fix any carpet that is loose or worn.  Avoid having throw rugs at the top or bottom of the stairs. If you do have throw rugs, attach them to the floor with carpet tape.  Make sure that you have a light switch at the top of the stairs and the bottom of the stairs. If you do not have them, ask someone to add them for you. What else can I do to help prevent falls?  Wear shoes that:  Do not have high heels.  Have rubber bottoms.  Are comfortable and fit you well.  Are closed at the toe. Do not wear sandals.  If you use a stepladder:  Make sure that it is fully opened. Do not climb a closed stepladder.  Make sure that both sides of the stepladder are locked into place.  Ask someone to hold it for you, if possible.  Clearly mark and make sure that you can see:  Any grab bars or handrails.  First and last steps.  Where the edge of each step is.  Use tools that help you move around (mobility aids) if they are needed. These include:  Canes.  Walkers.  Scooters.  Crutches.  Turn on the lights when you go into a dark area. Replace any light bulbs as soon as they burn out.  Set up your furniture so you have a clear path. Avoid moving your furniture around.  If any of your floors are uneven, fix them.  If there are any pets around you, be aware of where they are.  Review your medicines with your doctor. Some medicines can make you feel dizzy. This can increase your chance of falling. Ask your doctor what other things that you can do to help prevent falls. This information is not intended to replace advice given to you by your health care provider. Make sure you discuss any questions you have with your health care provider. Document Released: 05/11/2009 Document Revised: 12/21/2015 Document Reviewed: 08/19/2014 Elsevier Interactive Patient Education   2017 [employer].

## 2020-02-11 NOTE — Progress Notes (Signed)
Subjective:   Michele Long is a 69 y.o. female who presents for Medicare Annual (Subsequent) preventive examination.  Review of Systems    No ROS.  Medicare Wellness Virtual Visit.    Cardiac Risk Factors include: advanced age (>58men, >63 women)     Objective:    Today's Vitals   02/11/20 1141  Weight: 221 lb (100.2 kg)  Height: 5\' 6"  (1.676 m)   Body mass index is 35.67 kg/m.  Advanced Directives 02/11/2020 02/10/2019 01/28/2018  Does Patient Have a Medical Advance Directive? Yes No No  Does patient want to make changes to medical advance directive? No - Patient declined - -  Would patient like information on creating a medical advance directive? - Yes (MAU/Ambulatory/Procedural Areas - Information given) -    Current Medications (verified) Outpatient Encounter Medications as of 02/11/2020  Medication Sig  . ALPRAZolam (XANAX) 0.5 MG tablet Take 0.5 tablets (0.25 mg total) by mouth daily as needed for anxiety.  Marland Kitchen buPROPion (WELLBUTRIN XL) 150 MG 24 hr tablet TAKE 1 TABLET BY MOUTH EVERY DAY  . citalopram (CELEXA) 40 MG tablet TAKE 1 TABLET BY MOUTH EVERY DAY  . furosemide (LASIX) 20 MG tablet TAKE 1 TABLET BY MOUTH EVERY DAY  . HYDROcodone-acetaminophen (NORCO/VICODIN) 5-325 MG tablet Take 1 tablet by mouth every 6 (six) hours as needed for moderate pain.  Marland Kitchen ibuprofen (ADVIL,MOTRIN) 600 MG tablet TAKE 1 TABLET BY MOUTH EVERY 8 HOURS AS NEEDED.  Marland Kitchen pantoprazole (PROTONIX) 40 MG tablet TAKE 1 TABLET BY MOUTH EVERY DAY   No facility-administered encounter medications on file as of 02/11/2020.    Allergies (verified) Erythromycin and Sulfa antibiotics   History: Past Medical History:  Diagnosis Date  . Allergy   . Depression   . DVT (deep venous thrombosis) ([place])   . Edema   . Frequent headaches   . Gastric ulcer 10/27/2015  . GERD (gastroesophageal reflux disease)   . HTN (hypertension)   . Microalbuminuria   . Migraine   . Monocular diplopia of right eye 10/22/2016    . Shingles   . Symptomatic menopausal or female climacteric states   . UTI (lower urinary tract infection)    Past Surgical History:  Procedure Laterality Date  . AUGMENTATION MAMMAPLASTY Bilateral 1983  . BREAST ENHANCEMENT SURGERY  1983  . NASAL SEPTUM SURGERY  2000   Family History  Problem Relation Age of Onset  . Arthritis Mother   . Hypertension Mother   . Hypertension Father   . Osteoarthritis Father   . Migraines Father   . Cancer Maternal Grandmother   . Diabetes Maternal Grandfather   . Heart disease Maternal Grandfather   . Arthritis Paternal Grandmother   . Cancer Paternal Grandfather        Prostate  . Pancreatic cancer Paternal Grandfather   . Diabetes Brother    Social History   Socioeconomic History  . Marital status: Widowed    Spouse name: Not on file  . Number of children: Not on file  . Years of education: Not on file  . Highest education level: Not on file  Occupational History  . Not on file  Tobacco Use  . Smoking status: Never Smoker  . Smokeless tobacco: Never Used  Vaping Use  . Vaping Use: Never used  Substance and Sexual Activity  . Alcohol use: No    Alcohol/week: 0.0 standard drinks  . Drug use: No  . Sexual activity: Never  Other Topics Concern  . Not on  file  Social History Narrative   2 Children (1 daughter 50) and 1 son passed away at 7 months    2 dogs    Caffeine 1 cup coffee   Widower    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    Social Determinants of Radio broadcast assistant Strain:   . Difficulty of Paying Living Expenses:   Food Insecurity:   . Worried About Charity fundraiser in the Last Year:   . Arboriculturist in the Last Year:   Transportation Needs:   . Film/video editor (Medical):   Marland Kitchen Lack of Transportation (Non-Medical):   Physical Activity:   . Days of Exercise per Week:   . Minutes of Exercise per Session:   Stress:   . Feeling of Stress :   Social Connections:   . Frequency of Communication  with Friends and Family:   . Frequency of Social Gatherings with Friends and Family:   . Attends Religious Services:   . Active Member of Clubs or Organizations:   . Attends Archivist Meetings:   Marland Kitchen Marital Status:     Tobacco Counseling Counseling given: Not Answered   Clinical Intake:  Pre-visit preparation completed: Yes        Diabetes: No  How often do you need to have someone help you when you read instructions, pamphlets, or other written materials from your doctor or pharmacy?: 1 - Never   Interpreter Needed?: No      Activities of Daily Living In your present state of health, do you have any difficulty performing the following activities: 02/11/2020  Hearing? N  Vision? N  Difficulty concentrating or making decisions? N  Walking or climbing stairs? N  Dressing or bathing? N  Doing errands, shopping? N  Preparing Food and eating ? N  Using the Toilet? N  In the past six months, have you accidently leaked urine? N  Do you have problems with loss of bowel control? N  Managing your Medications? N  Managing your Finances? N  Housekeeping or managing your Housekeeping? N  Some recent data might be hidden    Patient Care Team: Leone Haven, MD as PCP - General (Family Medicine)  Indicate any recent Medical Services you may have received from other than [facility] providers in the past year (date may be approximate).     Assessment:   This is a routine wellness examination for [name].  I connected with Michele Long today by telephone and verified that I am speaking with the correct person using two identifiers. Location patient: home Location provider: work Persons participating in the virtual visit: patient, Marine scientist.    I discussed the limitations, risks, security and privacy concerns of performing an evaluation and management service by telephone and the availability of in person appointments. The patient expressed understanding and verbally consented  to this telephonic visit.    Interactive audio and video telecommunications were attempted between this provider and patient, however failed, due to patient having technical difficulties OR patient did not have access to video capability.  We continued and completed visit with audio only.  Some vital signs may be absent or patient reported.   Hearing/Vision screen  Hearing Screening   125Hz  250Hz  500Hz  1000Hz  2000Hz  3000Hz  4000Hz  6000Hz  8000Hz   Right ear:           Left ear:           Comments: Patient is able to hear conversational tones without  difficulty.  No issues reported.  Vision Screening Comments: Followed by Dr. Ellin Mayhew Wears corrective lenses Visual acuity not assessed, virtual visit.  They have seen their ophthalmologist.     Dietary issues and exercise activities discussed: Current Exercise Habits: Home exercise routine, Type of exercise: walking, Intensity: Mild  Goals      Patient Stated   .  Follow up with Primary Care Provider (pt-stated)      Follow up with doctor as needed and directed.  Schedule: Mammogram, bone density.     .  I can walk more for exercise (pt-stated)      Depression Screen PHQ 2/9 Scores 02/11/2020 02/10/2019 07/13/2018 01/30/2016 12/27/2015  PHQ - 2 Score 0 - - 1 3  PHQ- 9 Score - - - 6 15  Exception Documentation - Medical reason Medical reason - -    Fall Risk Fall Risk  02/11/2020 02/10/2019 07/13/2018 01/06/2018 09/09/2017  Falls in the past year? 0 0 0 No Yes  Number falls in past yr: 0 - 0 - 1  Injury with Fall? - - 0 - No  Follow up Falls evaluation completed - - - -   Handrails in use when climbing stairs? Yes  Home free of loose throw rugs in walkways, pet beds, electrical cords, etc? Yes  Adequate lighting in your home to reduce risk of falls? Yes   TIMED UP AND GO: Was the test performed? Yes .   Cognitive Function:  Patient is alert and oriented x3.   6CIT Screen 02/11/2020 02/10/2019  What Year? 0 points 0 points  What  month? 0 points 0 points  What time? 0 points 0 points  Count back from 20 0 points 0 points  Months in reverse 0 points 0 points  Repeat phrase 0 points 0 points  Total Score 0 0   Immunizations Immunization History  Administered Date(s) Administered  . Influenza, High Dose Seasonal PF 06/06/2017, 04/22/2018  . Influenza,inj,Quad PF,6+ Mos 06/12/2016  . Pneumococcal Conjugate-13 05/14/2018   Covid vaccine- declined Tdap and Pneumococcal vaccine- deferred per patient  Health Maintenance Health Maintenance  Topic Date Due  . COVID-19 Vaccine (1) 02/27/2020 (Originally 05/11/1963)  . MAMMOGRAM  02/10/2021 (Originally 10/15/2018)  . DEXA SCAN  02/10/2021 (Originally 05/11/2017)  . TETANUS/TDAP  02/10/2021 (Originally 05/12/1970)  . PNA vac Low Risk Adult (2 of 2 - PPSV23) 02/10/2021 (Originally 05/15/2019)  . INFLUENZA VACCINE  02/27/2020  . COLONOSCOPY  06/09/2022  . Hepatitis C Screening  Completed   Mammogram- deferred per patient  Dexa Scan- deferred per patient  Dental Screening: Recommended annual dental exams for proper oral hygiene.   Community Resource Referral / Chronic Care Management: CRR required this visit?  No   CCM required this visit?  No      Plan:   Keep all routine maintenance appointments.   Schedule mammogram and bopne density  Educational information re: mammogram, bone density, tdap, pneumococcal, covid precautions and brain health exercises mailed per patient request.  I have personally reviewed and noted the following in the patient's chart:   . Medical and social history . Use of alcohol, tobacco or illicit drugs  . Current medications and supplements . Functional ability and status . Nutritional status . Physical activity . Advanced directives . List of other physicians . Hospitalizations, surgeries, and ER visits in previous 12 months . Vitals . Screenings to include cognitive, depression, and falls . Referrals and  appointments  In addition, I have reviewed and discussed with  patient certain preventive protocols, quality metrics, and best practice recommendations. A written personalized care plan for preventive services as well as general preventive health recommendations were provided to patient via mychart.     Varney Biles, LPN   [id number]

## 2020-03-14 ENCOUNTER — Other Ambulatory Visit: Payer: Self-pay | Admitting: Family Medicine

## 2020-03-22 ENCOUNTER — Telehealth: Payer: Self-pay | Admitting: Family Medicine

## 2020-03-22 NOTE — Telephone Encounter (Signed)
Michele Long  Patient Appointment Schedule Request Pool 35 minutes ago (3:47 PM)   Appointment Request From: Michele Long  With Provider: Tommi Rumps, MD [facility] ]  Preferred Date Range: Any  Preferred Times: Any Time  Reason for visit: Request an Appointment  Comments: Am thinking my antidepressant may need to be adjusted or changed. Having frequent crying spells, very emotional

## 2020-03-23 ENCOUNTER — Telehealth: Payer: Self-pay

## 2020-03-23 NOTE — Telephone Encounter (Signed)
Spoke with the patient and asked her about her crying spells and her emotions, patient stated she is just feeling emotional. Patient stated she is not having any suicidal thoughts at all, patient was scheduled for a visit next Friday permission from provider granted,  to adjust her antidepressant medication with the provider.  Blyss Lugar,cma

## 2020-03-31 ENCOUNTER — Encounter: Payer: Self-pay | Admitting: Family Medicine

## 2020-03-31 ENCOUNTER — Other Ambulatory Visit: Payer: Self-pay

## 2020-03-31 ENCOUNTER — Telehealth (INDEPENDENT_AMBULATORY_CARE_PROVIDER_SITE_OTHER): Payer: PPO | Admitting: Family Medicine

## 2020-03-31 DIAGNOSIS — F418 Other specified anxiety disorders: Secondary | ICD-10-CM | POA: Diagnosis not present

## 2020-03-31 MED ORDER — BUPROPION HCL ER (XL) 150 MG PO TB24: 300.0000 mg | ORAL_TABLET | Freq: Every day | ORAL | 1 refills | Status: DC

## 2020-03-31 MED ORDER — HYDROXYZINE HCL 10 MG PO TABS: 10.0000 mg | ORAL_TABLET | Freq: Three times a day (TID) | ORAL | 0 refills | Status: DC | PRN

## 2020-03-31 NOTE — Assessment & Plan Note (Signed)
Worsening issues with this.  We will increase her Wellbutrin to 300 mg once daily.  She will continue Celexa.  She will send [place] a message in 2 weeks if this has not provided some benefit.  Would consider changing her Celexa to an alternative treatment at that time.  She will check with her insurance regarding coverage for therapy.  If she would like to proceed with that she will let us know.

## 2020-03-31 NOTE — Progress Notes (Signed)
Virtual Visit via telephone Note  This visit type was conducted due to national recommendations for restrictions regarding the COVID-19 pandemic (e.g. social distancing).  This format is felt to be most appropriate for this patient at this time.  All issues noted in this document were discussed and addressed.  No physical exam was performed (except for noted visual exam findings with Video Visits).   I connected with Michele Long today at  3:15 PM EDT by telephone and verified that I am speaking with the correct person using two identifiers. Location patient: home Location provider: work  Persons participating in the virtual visit: patient, provider  I discussed the limitations, risks, security and privacy concerns of performing an evaluation and management service by telephone and the availability of in person appointments. I also discussed with the patient that there may be a patient responsible charge related to this service. The patient expressed understanding and agreed to proceed.  Interactive audio and video telecommunications were attempted between this provider and patient, however failed, due to patient having technical difficulties OR patient did not have access to video capability.  We continued and completed visit with audio only.   Reason for visit: f/u  HPI: Anxiety/depression: Notes her depression is worsened over the last couple of months.  She has episodes of crying.  She feels inadequate.  She has had a hard time dealing with her daughter and has had several fights with her.  She also notes the stress of her and her daughter not being vaccinated and some of her family members who have been vaccinated have been avoiding seeing her.  Notes that has been difficult.  She has been on Celexa for a long time.  She is currently on Wellbutrin as well.  No SI.   ROS: See pertinent positives and negatives per HPI.  Past Medical History:  Diagnosis Date  . Allergy   . Depression     . DVT (deep venous thrombosis) ([place])   . Edema   . Frequent headaches   . Gastric ulcer 10/27/2015  . GERD (gastroesophageal reflux disease)   . HTN (hypertension)   . Microalbuminuria   . Migraine   . Monocular diplopia of right eye 10/22/2016  . Shingles   . Symptomatic menopausal or female climacteric states   . UTI (lower urinary tract infection)     Past Surgical History:  Procedure Laterality Date  . AUGMENTATION MAMMAPLASTY Bilateral 1983  . BREAST ENHANCEMENT SURGERY  1983  . NASAL SEPTUM SURGERY  2000    Family History  Problem Relation Age of Onset  . Arthritis Mother   . Hypertension Mother   . Hypertension Father   . Osteoarthritis Father   . Migraines Father   . Cancer Maternal Grandmother   . Diabetes Maternal Grandfather   . Heart disease Maternal Grandfather   . Arthritis Paternal Grandmother   . Cancer Paternal Grandfather        Prostate  . Pancreatic cancer Paternal Grandfather   . Diabetes Brother     SOCIAL HX: Non-smoker   Current Outpatient Medications:  .  buPROPion (WELLBUTRIN XL) 150 MG 24 hr tablet, Take 2 tablets (300 mg total) by mouth daily., Disp: 180 tablet, Rfl: 1 .  citalopram (CELEXA) 40 MG tablet, TAKE 1 TABLET BY MOUTH EVERY DAY, Disp: 90 tablet, Rfl: 1 .  furosemide (LASIX) 20 MG tablet, TAKE 1 TABLET BY MOUTH EVERY DAY, Disp: 90 tablet, Rfl: 1 .  ibuprofen (ADVIL,MOTRIN) 600 MG tablet, TAKE  1 TABLET BY MOUTH EVERY 8 HOURS AS NEEDED., Disp: 30 tablet, Rfl: 0 .  pantoprazole (PROTONIX) 40 MG tablet, TAKE 1 TABLET BY MOUTH EVERY DAY, Disp: 90 tablet, Rfl: 1 .  hydrOXYzine (ATARAX/VISTARIL) 10 MG tablet, Take 1 tablet (10 mg total) by mouth 3 (three) times daily as needed for anxiety., Disp: 30 tablet, Rfl: 0  EXAM: This is a telephone visit and thus no physical exam was completed.  ASSESSMENT AND PLAN:  Discussed the following assessment and plan:  Depression with anxiety Worsening issues with this.  We will increase her  Wellbutrin to 300 mg once daily.  She will continue Celexa.  She will send [place] a message in 2 weeks if this has not provided some benefit.  Would consider changing her Celexa to an alternative treatment at that time.  She will check with her insurance regarding coverage for therapy.  If she would like to proceed with that she will let us know.   No orders of the defined types were placed in this encounter.   Meds ordered this encounter  Medications  . buPROPion (WELLBUTRIN XL) 150 MG 24 hr tablet    Sig: Take 2 tablets (300 mg total) by mouth daily.    Dispense:  180 tablet    Refill:  1  . hydrOXYzine (ATARAX/VISTARIL) 10 MG tablet    Sig: Take 1 tablet (10 mg total) by mouth 3 (three) times daily as needed for anxiety.    Dispense:  30 tablet    Refill:  0    Health maintenance: I encouraged the patient to get the COVID-19 vaccine.  Discussed safety of the vaccine.  Discussed risk of getting COVID-19.  Discussed the Pfizer vaccine is FDA approved.  Encouraged her to get any vaccine that was available to her though the mRNA vaccines do have a plan in place for booster vaccines and it may be best if she gets 1 of those.   I discussed the assessment and treatment plan with the patient. The patient was provided an opportunity to ask questions and all were answered. The patient agreed with the plan and demonstrated an understanding of the instructions.   The patient was advised to call back or seek an in-person evaluation if the symptoms worsen or if the condition fails to improve as anticipated.  I provided 12 minutes of non-face-to-face time during this encounter.   Tommi Rumps, MD

## 2020-05-16 ENCOUNTER — Ambulatory Visit: Payer: PPO | Admitting: Family Medicine

## 2020-05-31 DIAGNOSIS — J019 Acute sinusitis, unspecified: Secondary | ICD-10-CM | POA: Diagnosis not present

## 2020-05-31 DIAGNOSIS — Z20822 Contact with and (suspected) exposure to covid-19: Secondary | ICD-10-CM | POA: Diagnosis not present

## 2020-05-31 DIAGNOSIS — B9689 Other specified bacterial agents as the cause of diseases classified elsewhere: Secondary | ICD-10-CM | POA: Diagnosis not present

## 2020-05-31 DIAGNOSIS — Z03818 Encounter for observation for suspected exposure to other biological agents ruled out: Secondary | ICD-10-CM | POA: Diagnosis not present

## 2020-07-18 DIAGNOSIS — H903 Sensorineural hearing loss, bilateral: Secondary | ICD-10-CM | POA: Diagnosis not present

## 2020-08-29 ENCOUNTER — Other Ambulatory Visit: Payer: Self-pay | Admitting: Family Medicine

## 2020-11-10 ENCOUNTER — Other Ambulatory Visit: Payer: Self-pay | Admitting: Family Medicine

## 2020-12-21 DIAGNOSIS — L298 Other pruritus: Secondary | ICD-10-CM | POA: Diagnosis not present

## 2021-02-13 ENCOUNTER — Ambulatory Visit (INDEPENDENT_AMBULATORY_CARE_PROVIDER_SITE_OTHER): Payer: PPO

## 2021-02-13 VITALS — Ht 66.0 in | Wt 200.0 lb

## 2021-02-13 DIAGNOSIS — Z0000 Encounter for general adult medical examination without abnormal findings: Secondary | ICD-10-CM

## 2021-02-13 NOTE — Progress Notes (Signed)
Subjective:   Michele Long is a 70 y.o. female who presents for Medicare Annual (Subsequent) preventive examination.  Review of Systems    No ROS.  Medicare Wellness Virtual Visit.  Visual/audio telehealth visit, UTA vital signs.   See social history for additional risk factors.         Objective:    Today's Vitals   02/13/21 1123  Weight: 200 lb (90.7 kg)  Height: 5\' 6"  (1.676 m)   Body mass index is 32.28 kg/m.  Advanced Directives 02/13/2021 02/11/2020 02/10/2019 01/28/2018  Does Patient Have a Medical Advance Directive? No Yes No No  Does patient want to make changes to medical advance directive? - No - Patient declined - -  Would patient like information on creating a medical advance directive? No - Patient declined - Yes (MAU/Ambulatory/Procedural Areas - Information given) -    Current Medications (verified) Outpatient Encounter Medications as of 02/13/2021  Medication Sig   buPROPion (WELLBUTRIN XL) 150 MG 24 hr tablet TAKE 2 TABLETS (300 MG TOTAL) BY MOUTH DAILY.   citalopram (CELEXA) 40 MG tablet TAKE 1 TABLET BY MOUTH EVERY DAY   furosemide (LASIX) 20 MG tablet TAKE 1 TABLET BY MOUTH EVERY DAY   hydrOXYzine (ATARAX/VISTARIL) 10 MG tablet Take 1 tablet (10 mg total) by mouth 3 (three) times daily as needed for anxiety.   ibuprofen (ADVIL,MOTRIN) 600 MG tablet TAKE 1 TABLET BY MOUTH EVERY 8 HOURS AS NEEDED.   pantoprazole (PROTONIX) 40 MG tablet TAKE 1 TABLET BY MOUTH EVERY DAY   No facility-administered encounter medications on file as of 02/13/2021.    Allergies (verified) Erythromycin and Sulfa antibiotics   History: Past Medical History:  Diagnosis Date   Allergy    Depression    DVT (deep venous thrombosis) ([facility])    Edema    Frequent headaches    Gastric ulcer 10/27/2015   GERD (gastroesophageal reflux disease)    HTN (hypertension)    Microalbuminuria    Migraine    Monocular diplopia of right eye 10/22/2016   Shingles    Symptomatic menopausal or  female climacteric states    UTI (lower urinary tract infection)    Past Surgical History:  Procedure Laterality Date   AUGMENTATION MAMMAPLASTY Bilateral 1983   BREAST ENHANCEMENT SURGERY  1983   NASAL SEPTUM SURGERY  2000   Family History  Problem Relation Age of Onset   Arthritis Mother    Hypertension Mother    Hypertension Father    Osteoarthritis Father    Migraines Father    Cancer Maternal Grandmother    Diabetes Maternal Grandfather    Heart disease Maternal Grandfather    Arthritis Paternal Grandmother    Cancer Paternal Grandfather        Prostate   Pancreatic cancer Paternal Grandfather    Diabetes Brother    Social History   Socioeconomic History   Marital status: Widowed    Spouse name: Not on file   Number of children: Not on file   Years of education: Not on file   Highest education level: Not on file  Occupational History   Not on file  Tobacco Use   Smoking status: Never   Smokeless tobacco: Never  Vaping Use   Vaping Use: Never used  Substance and Sexual Activity   Alcohol use: No    Alcohol/week: 0.0 standard drinks   Drug use: No   Sexual activity: Never  Other Topics Concern   Not on file  Social History  Narrative   2 Children (1 daughter 57) and 1 son passed away at 7 months    2 dogs    Caffeine 1 cup coffee   Widower    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    Social Determinants of Radio broadcast assistant Strain: Low Risk    Difficulty of Paying Living Expenses: Not hard at all  Food Insecurity: No Food Insecurity   Worried About Charity fundraiser in the Last Year: Never true   Arboriculturist in the Last Year: Never true  Transportation Needs: No Transportation Needs   Lack of Transportation (Medical): No   Lack of Transportation (Non-Medical): No  Physical Activity: Not on file  Stress: No Stress Concern Present   Feeling of Stress : Only a little  Social Connections: Unknown   Frequency of Communication with Friends and  Family: More than three times a week   Frequency of Social Gatherings with Friends and Family: More than three times a week   Attends Religious Services: Not on Electrical engineer or Organizations: Not on file   Attends Archivist Meetings: Not on file   Marital Status: Married    Tobacco Counseling Counseling given: Not Answered   Clinical Intake:  Pre-visit preparation completed: Yes        Diabetes: No  How often do you need to have someone help you when you read instructions, pamphlets, or other written materials from your doctor or pharmacy?: 1 - Never  Interpreter Needed?: No      Activities of Daily Living No flowsheet data found.  Patient Care Team: Leone Haven, MD as PCP - General (Family Medicine)  Indicate any recent Medical Services you may have received from other than [facility] providers in the past year (date may be approximate).     Assessment:   This is a routine wellness examination for [place].  I connected with Izora Gala today by telephone and verified that I am speaking with the correct person using two identifiers. Location patient: home Location provider: work Persons participating in the virtual visit: patient, Marine scientist.    I discussed the limitations, risks, security and privacy concerns of performing an evaluation and management service by telephone and the availability of in person appointments. The patient expressed understanding and verbally consented to this telephonic visit.    Interactive audio and video telecommunications were attempted between this provider and patient, however failed, due to patient having technical difficulties OR patient did not have access to video capability.  We continued and completed visit with audio only.  Some vital signs may be absent or patient reported.   Hearing/Vision screen Hearing Screening - Comments:: Hearing aid, bilateral  Vision Screening - Comments:: Followed by  Wears  corrective lenses Cataract extraction, bilateral Visual acuity not assessed, virtual visit.  They have seen their ophthalmologist in the last 12 months.    Dietary issues and exercise activities discussed:  Regular diet Good water intake   Goals Addressed   None    Depression Screen PHQ 2/9 Scores 02/13/2021 03/31/2020 02/11/2020 02/10/2019 07/13/2018 01/30/2016 12/27/2015  PHQ - 2 Score 0 4 0 - - 1 3  PHQ- 9 Score - 17 - - - 6 15  Exception Documentation - - - Medical reason Medical reason - -    Fall Risk Fall Risk  02/11/2020 02/10/2019 07/13/2018 01/06/2018 09/09/2017  Falls in the past year? 0 0 0 No Yes  Number falls  in past yr: 0 - 0 - 1  Injury with Fall? - - 0 - No  Follow up Falls evaluation completed - - - -    FALL RISK PREVENTION PERTAINING TO THE HOME: Adequate lighting in your home to reduce risk of falls? Yes   ASSISTIVE DEVICES UTILIZED TO PREVENT FALLS: Use of a cane, walker or w/c? No   TIMED UP AND GO: Was the test performed? No .   Cognitive Function:  Patient is alert and oriented x3.    6CIT Screen 02/11/2020 02/10/2019  What Year? 0 points 0 points  What month? 0 points 0 points  What time? 0 points 0 points  Count back from 20 0 points 0 points  Months in reverse 0 points 0 points  Repeat phrase 0 points 0 points  Total Score 0 0    Immunizations Immunization History  Administered Date(s) Administered   Influenza, High Dose Seasonal PF 06/06/2017, 04/22/2018   Influenza,inj,Quad PF,6+ Mos 06/12/2016   PFIZER(Purple Top)SARS-COV-2 Vaccination 04/26/2020, 06/05/2020   Pneumococcal Conjugate-13 05/14/2018    TDAP status: Due, Education has been provided regarding the importance of this vaccine. Advised may receive this vaccine at local pharmacy or Health Dept. Aware to provide a copy of the vaccination record if obtained from local pharmacy or Health Dept. Verbalized acceptance and understanding. Deferred.   Shingles deferred.        Health  Maintenance Health Maintenance  Topic Date Due   COVID-19 Vaccine (3 - Booster for Pfizer series) 03/01/2021 (Originally 11/03/2020)   Zoster Vaccines- Shingrix (1 of 2) 05/16/2021 (Originally 05/11/2002)   MAMMOGRAM  02/13/2022 (Originally 10/15/2018)   DEXA SCAN  02/13/2022 (Originally 05/11/2017)   TETANUS/TDAP  02/13/2022 (Originally 05/12/1970)   PNA vac Low Risk Adult (2 of 2 - PPSV23) 02/13/2022 (Originally 05/15/2019)   INFLUENZA VACCINE  02/26/2021   COLONOSCOPY (Pts 45-67yrs Insurance coverage will need to be confirmed)  06/09/2022   Hepatitis C Screening  Completed   HPV VACCINES  Aged Out   Mammogram- deferred per patient  Dexa Scan- deferred per patient  Lung Cancer Screening: (Low Dose CT Chest recommended if Age 10-80 years, 30 pack-year currently smoking OR have quit w/in 15years.) does not qualify.   Vision Screening: Recommended annual ophthalmology exams for early detection of glaucoma and other disorders of the eye. Is the patient up to date with their annual eye exam?  Yes   Dental Screening: Recommended annual dental exams for proper oral hygiene  Community Resource Referral / Chronic Care Management: CRR required this visit?  No   CCM required this visit?  No      Plan:   Keep all routine maintenance appointments.   Offered annual visit with pcp. Declined. Plans to call the office back and schedule.   I have personally reviewed and noted the following in the patient's chart:   Medical and social history Use of alcohol, tobacco or illicit drugs  Current medications and supplements including opioid prescriptions.  Functional ability and status Nutritional status Physical activity Advanced directives List of other physicians Hospitalizations, surgeries, and ER visits in previous 12 months Vitals Screenings to include cognitive, depression, and falls Referrals and appointments  In addition, I have reviewed and discussed with patient certain  preventive protocols, quality metrics, and best practice recommendations. A written personalized care plan for preventive services as well as general preventive health recommendations were provided to patient via mychart.     Varney Biles, LPN   03/15/5630

## 2021-02-13 NOTE — Patient Instructions (Addendum)
  Michele Long , Thank you for taking time to come for your Medicare Wellness Visit. I appreciate your ongoing commitment to your health goals. Please review the following plan we discussed and let me know if I can assist you in the future.   These are the goals we discussed:  Goals       Patient Stated     Follow up with Primary Care Provider (pt-stated)      Follow up with doctor as needed and directed.  Schedule: Mammogram, bone density.       I can walk more for exercise (pt-stated)        This is a list of the screening recommended for you and due dates:  Health Maintenance  Topic Date Due   COVID-19 Vaccine (3 - Booster for Pfizer series) 03/01/2021*   Zoster (Shingles) Vaccine (1 of 2) 05/16/2021*   Mammogram  02/13/2022*   DEXA scan (bone density measurement)  02/13/2022*   Tetanus Vaccine  02/13/2022*   Pneumonia vaccines (2 of 2 - PPSV23) 02/13/2022*   Flu Shot  02/26/2021   Colon Cancer Screening  06/09/2022   Hepatitis C Screening: USPSTF Recommendation to screen - Ages 18-79 yo.  Completed   HPV Vaccine  Aged Out  *Topic was postponed. The date shown is not the original due date.

## 2021-02-23 ENCOUNTER — Other Ambulatory Visit: Payer: Self-pay | Admitting: Family Medicine

## 2021-03-15 ENCOUNTER — Telehealth: Payer: Self-pay

## 2021-03-15 NOTE — Telephone Encounter (Signed)
Spoken to patient, she is currently having fever, chills, body aches, headache, nasal congestions, coughing, and sore throat. Sx started yesterday. Patient has only taken tylenol for her sx. Patient does not have SX of SOB, heart palpitations, blurry vision, and no loss of taste or smell.

## 2021-03-15 NOTE — Telephone Encounter (Signed)
Noted.  I would encourage her to seek evaluation at urgent care or the [facility] walk-in clinic to consider treatment with one of the oral medications used to treat COVID-19.  She needs to remain quarantined for 10 days from onset of her symptoms and test to have improving symptoms with no fever in the prior 24 hours prior to coming off of quarantine.  If she develops shortness of breath, chest pain, or fevers greater than 103 F she needs to be evaluated in person in the emergency department right away.

## 2021-03-15 NOTE — Telephone Encounter (Signed)
Unable to leave message for patient to return call back. Due to vm box not being set up yet

## 2021-03-16 NOTE — Telephone Encounter (Signed)
Patient has been informed.

## 2021-05-09 ENCOUNTER — Other Ambulatory Visit (HOSPITAL_COMMUNITY): Payer: Self-pay | Admitting: Internal Medicine

## 2021-05-09 ENCOUNTER — Other Ambulatory Visit: Payer: Self-pay

## 2021-05-09 ENCOUNTER — Other Ambulatory Visit: Payer: Self-pay | Admitting: Internal Medicine

## 2021-05-09 ENCOUNTER — Ambulatory Visit
Admission: RE | Admit: 2021-05-09 | Discharge: 2021-05-09 | Disposition: A | Discharge status: Home / Self Care | Payer: PPO | Source: Ambulatory Visit | Attending: Internal Medicine | Admitting: Internal Medicine

## 2021-05-09 DIAGNOSIS — M79662 Pain in left lower leg: Secondary | ICD-10-CM | POA: Insufficient documentation

## 2021-05-09 DIAGNOSIS — R600 Localized edema: Secondary | ICD-10-CM | POA: Diagnosis not present

## 2021-05-09 DIAGNOSIS — M7989 Other specified soft tissue disorders: Secondary | ICD-10-CM

## 2021-06-02 ENCOUNTER — Emergency Department: Payer: PPO

## 2021-06-02 ENCOUNTER — Encounter: Payer: Self-pay | Admitting: Emergency Medicine

## 2021-06-02 ENCOUNTER — Emergency Department
Admission: EM | Admit: 2021-06-02 | Discharge: 2021-06-02 | Disposition: A | Discharge status: Home / Self Care | Payer: PPO | Attending: Emergency Medicine | Admitting: Emergency Medicine

## 2021-06-02 DIAGNOSIS — Z23 Encounter for immunization: Secondary | ICD-10-CM | POA: Diagnosis not present

## 2021-06-02 DIAGNOSIS — W540XXA Bitten by dog, initial encounter: Secondary | ICD-10-CM | POA: Insufficient documentation

## 2021-06-02 DIAGNOSIS — I10 Essential (primary) hypertension: Secondary | ICD-10-CM | POA: Diagnosis not present

## 2021-06-02 DIAGNOSIS — S71051A Open bite, right hip, initial encounter: Secondary | ICD-10-CM | POA: Diagnosis not present

## 2021-06-02 DIAGNOSIS — S71151A Open bite, right thigh, initial encounter: Secondary | ICD-10-CM | POA: Insufficient documentation

## 2021-06-02 MED ORDER — ONDANSETRON 4 MG PO TBDP: 4.0000 mg | ORAL_TABLET | Freq: Three times a day (TID) | ORAL | 0 refills | Status: DC | PRN

## 2021-06-02 MED ORDER — TETANUS-DIPHTH-ACELL PERTUSSIS 5-2.5-18.5 LF-MCG/0.5 IM SUSY
0.5000 mL | PREFILLED_SYRINGE | Freq: Once | INTRAMUSCULAR | Status: AC
  Administered 2021-06-02: 0.5 mL via INTRAMUSCULAR
  Filled 2021-06-02: qty 0.5

## 2021-06-02 MED ORDER — TRAMADOL HCL 50 MG PO TABS: 50.0000 mg | ORAL_TABLET | Freq: Four times a day (QID) | ORAL | 0 refills | Status: DC | PRN

## 2021-06-02 MED ORDER — AMOXICILLIN-POT CLAVULANATE 875-125 MG PO TABS: 1.0000 | ORAL_TABLET | Freq: Two times a day (BID) | ORAL | 0 refills | Status: AC

## 2021-06-02 NOTE — ED Triage Notes (Signed)
Pt via POV from home. Pt is was bite by a family dog on her R hip, shots are up to date. Last tetanus was approx 9 years ago.  Pt is A&Ox4 and NAD.

## 2021-06-02 NOTE — ED Notes (Signed)
Patient stable and discharged with all personal belongings and AVS. AVS and discharge instructions reviewed with patient and opportunity for questions provided.   

## 2021-06-02 NOTE — ED Notes (Signed)
Wound cleansed with saline.

## 2021-06-02 NOTE — ED Provider Notes (Signed)
[facility] Emergency Department Provider Note  ____________________________________________   Event Date/Time   First MD Initiated Contact with Patient 06/02/21 1642     (approximate)  I have reviewed the triage vital signs and the nursing notes.   HISTORY  Chief Complaint Animal Bite    HPI Michele Long is a 70 y.o. female presents emergency department with a dog bite to the right hip.  Patient was at a family member's house when the dog bit her.  Dog's immunizations are up-to-date.  Patient's last Tdap was 9 years ago.  Past Medical History:  Diagnosis Date   Allergy    Depression    DVT (deep venous thrombosis) (HCC)    Edema    Frequent headaches    Gastric ulcer 10/27/2015   GERD (gastroesophageal reflux disease)    HTN (hypertension)    Microalbuminuria    Migraine    Monocular diplopia of right eye 10/22/2016   Shingles    Symptomatic menopausal or female climacteric states    UTI (lower urinary tract infection)     Patient Active Problem List   Diagnosis Date Noted   Shingles 07/13/2018   Acute pain of right shoulder 01/06/2018   Cervical stenosis (uterine cervix) 09/09/2017   Elevated BP without diagnosis of hypertension 06/06/2017   Bilateral lower extremity edema 01/30/2016   Obesity 12/27/2015   Frequent headaches 10/27/2015   Environmental allergies 10/27/2015   Depression with anxiety 10/23/2015   GERD (gastroesophageal reflux disease) 10/23/2015   Vitamin D deficiency 10/23/2015    Past Surgical History:  Procedure Laterality Date   AUGMENTATION MAMMAPLASTY Bilateral [place]   NASAL SEPTUM SURGERY  2000    Prior to Admission medications   Medication Sig Start Date End Date Taking? Authorizing Provider  amoxicillin-clavulanate (AUGMENTIN) 875-125 MG tablet Take 1 tablet by mouth 2 (two) times daily for 7 days. 06/02/21 06/09/21 Yes Ianmichael Amescua, Linden Dolin, PA-C  ondansetron (ZOFRAN-ODT) 4 MG  disintegrating tablet Take 1 tablet (4 mg total) by mouth every 8 (eight) hours as needed. 06/02/21  Yes Modestine Scherzinger, Linden Dolin, PA-C  traMADol (ULTRAM) 50 MG tablet Take 1 tablet (50 mg total) by mouth every 6 (six) hours as needed. 06/02/21  Yes Jenaya Saar, Linden Dolin, PA-C  buPROPion (WELLBUTRIN XL) 150 MG 24 hr tablet TAKE 2 TABLETS BY MOUTH DAILY. 02/23/21   Leone Haven, MD  citalopram (CELEXA) 40 MG tablet TAKE 1 TABLET BY MOUTH EVERY DAY 11/13/20   Leone Haven, MD  furosemide (LASIX) 20 MG tablet TAKE 1 TABLET BY MOUTH EVERY DAY 02/23/21   Leone Haven, MD  hydrOXYzine (ATARAX/VISTARIL) 10 MG tablet Take 1 tablet (10 mg total) by mouth 3 (three) times daily as needed for anxiety. 03/31/20   Leone Haven, MD  ibuprofen (ADVIL,MOTRIN) 600 MG tablet TAKE 1 TABLET BY MOUTH EVERY 8 HOURS AS NEEDED. 02/27/18   Leone Haven, MD  pantoprazole (PROTONIX) 40 MG tablet TAKE 1 TABLET BY MOUTH EVERY DAY 02/23/21   Leone Haven, MD    Allergies Erythromycin and Sulfa antibiotics  Family History  Problem Relation Age of Onset   Arthritis Mother    Hypertension Mother    Hypertension Father    Osteoarthritis Father    Migraines Father    Cancer Maternal Grandmother    Diabetes Maternal Grandfather    Heart disease Maternal Grandfather    Arthritis Paternal Grandmother    Cancer Paternal Grandfather  Prostate   Pancreatic cancer Paternal Grandfather    Diabetes Brother     Social History Social History   Tobacco Use   Smoking status: Never   Smokeless tobacco: Never  Vaping Use   Vaping Use: Never used  Substance Use Topics   Alcohol use: No    Alcohol/week: 0.0 standard drinks   Drug use: No    Review of Systems  Constitutional: No fever/chills Eyes: No visual changes. ENT: No sore throat. Respiratory: Denies cough Cardiovascular: Denies chest pain Gastrointestinal: Denies abdominal pain Genitourinary: Negative for dysuria. Musculoskeletal: Negative for  back pain. Skin: Negative for rash. Psychiatric: no mood changes,     ____________________________________________   PHYSICAL EXAM:  VITAL SIGNS: ED Triage Vitals  Enc Vitals Group     BP 06/02/21 1442 (!) 151/73     Pulse Rate 06/02/21 1442 89     Resp 06/02/21 1442 20     Temp 06/02/21 1442 98.2 F (36.8 C)     Temp Source 06/02/21 1442 Oral     SpO2 06/02/21 1442 94 %     Weight 06/02/21 1442 220 lb (99.8 kg)     Height 06/02/21 1442 5\' 5"  (1.651 m)     Head Circumference --      Peak Flow --      Pain Score 06/02/21 1439 4     Pain Loc --      Pain Edu? --      Excl. in [place]? --     Constitutional: Alert and oriented. Well appearing and in no acute distress. Eyes: Conjunctivae are normal.  Head: Atraumatic. Nose: No congestion/rhinnorhea. Mouth/Throat: Mucous membranes are moist.   Neck:  supple no lymphadenopathy noted Cardiovascular: Normal rate, regular rhythm. Heart sounds are normal Respiratory: Normal respiratory effort.  No retractions, lungs c t a  GU: deferred Musculoskeletal: FROM all extremities, warm and well perfused, bite marks noted on the right upper thigh, bruising noted, no foreign body noted Neurologic:  Normal speech and language.  Skin:  Skin is warm, dry  No rash noted. Psychiatric: Mood and affect are normal. Speech and behavior are normal.  ____________________________________________   LABS (all labs ordered are listed, but only abnormal results are displayed)  Labs Reviewed - No data to display ____________________________________________   ____________________________________________  RADIOLOGY  X-ray of the right femur  ____________________________________________   PROCEDURES  Procedure(s) performed: No  Procedures    ____________________________________________   INITIAL IMPRESSION / ASSESSMENT AND PLAN / ED COURSE  Pertinent labs & imaging results that were available during my care of the patient were  reviewed by me and considered in my medical decision making (see chart for details).   Patient is a 70 year old female presents emergency department with a dog bite.  See HPI.  Physical exam shows patient be stable  X-ray of the right femur to rule out foreign body is negative  Patient's Tdap was updated.  She was given a prescription for Augmentin.  She states penicillin makes her nauseated, so we gave her Zofran ODT to prevent nausea, tramadol for pain if needed.  She can also take Tylenol and ibuprofen.  She is to clean the wound daily.  Soap and water would be appropriate.  Return if any sign of infection.  She was discharged in stable condition.     Nayelie Gionfriddo was evaluated in Emergency Department on 06/02/2021 for the symptoms described in the history of present illness. She was evaluated in the context of the global  COVID-19 pandemic, which necessitated consideration that the patient might be at risk for infection with the SARS-CoV-2 virus that causes COVID-19. Institutional protocols and algorithms that pertain to the evaluation of patients at risk for COVID-19 are in a state of rapid change based on information released by regulatory bodies including the CDC and federal and state organizations. These policies and algorithms were followed during the patient's care in the ED.    As part of my medical decision making, I reviewed the following data within the [place] notes reviewed and incorporated, Old chart reviewed, Radiograph reviewed , Notes from prior ED visits, and [place] Controlled Substance Database  ____________________________________________   FINAL CLINICAL IMPRESSION(S) / ED DIAGNOSES  Final diagnoses:  Dog bite, initial encounter      NEW MEDICATIONS STARTED DURING THIS VISIT:  New Prescriptions   AMOXICILLIN-CLAVULANATE (AUGMENTIN) 875-125 MG TABLET    Take 1 tablet by mouth 2 (two) times daily for 7 days.   ONDANSETRON (ZOFRAN-ODT) 4 MG  DISINTEGRATING TABLET    Take 1 tablet (4 mg total) by mouth every 8 (eight) hours as needed.   TRAMADOL (ULTRAM) 50 MG TABLET    Take 1 tablet (50 mg total) by mouth every 6 (six) hours as needed.     Note:  This document was prepared using Dragon voice recognition software and may include unintentional dictation errors.    Versie Starks, PA-C 06/02/21 1652    Naaman Plummer, MD 06/02/21 [phone]

## 2021-06-02 NOTE — ED Provider Notes (Signed)
Emergency Medicine Provider Triage Evaluation Note  Michele Long , a 70 y.o. female  was evaluated in triage.  Pt complains of dog bite, [place], to the right hip/thigh.-1.5 hours prior to arrival.  Tdap is greater than 9 years ago.  Review of Systems  Positive: Dog bite Negative: Fever, chills patient has no shortness of breath  Physical Exam  There were no vitals taken for this visit. Gen:   Awake, no distress   Resp:  Normal effort  MSK:   Moves extremities without difficulty, puncture wounds bite marks noted, nothing that needs suturing Other:    Medical Decision Making  Medically screening exam initiated at 2:42 PM.  Appropriate orders placed.  [facility] was informed that the remainder of the evaluation will be completed by another provider, this initial triage assessment does not replace that evaluation, and the importance of remaining in the ED until their evaluation is complete.     Versie Starks, PA-C 06/02/21 1442    Naaman Plummer, MD 06/02/21 [phone]

## 2021-06-02 NOTE — Discharge Instructions (Signed)
Wash the wounds with soap and water daily. Take the antibiotic as prescribed. Return the emergency department if worsening

## 2021-07-09 ENCOUNTER — Other Ambulatory Visit: Payer: Self-pay | Admitting: Family Medicine

## 2021-09-19 DIAGNOSIS — M7072 Other bursitis of hip, left hip: Secondary | ICD-10-CM | POA: Diagnosis not present

## 2021-09-19 DIAGNOSIS — M7071 Other bursitis of hip, right hip: Secondary | ICD-10-CM | POA: Diagnosis not present

## 2021-09-19 DIAGNOSIS — M25551 Pain in right hip: Secondary | ICD-10-CM | POA: Diagnosis not present

## 2021-09-19 DIAGNOSIS — M25552 Pain in left hip: Secondary | ICD-10-CM | POA: Diagnosis not present

## 2021-11-16 ENCOUNTER — Other Ambulatory Visit: Payer: Self-pay

## 2021-11-16 ENCOUNTER — Emergency Department
Admission: EM | Admit: 2021-11-16 | Discharge: 2021-11-16 | Disposition: A | Discharge status: Home / Self Care | Payer: PPO | Attending: Emergency Medicine | Admitting: Emergency Medicine

## 2021-11-16 ENCOUNTER — Emergency Department: Payer: PPO

## 2021-11-16 DIAGNOSIS — W010XXA Fall on same level from slipping, tripping and stumbling without subsequent striking against object, initial encounter: Secondary | ICD-10-CM | POA: Insufficient documentation

## 2021-11-16 DIAGNOSIS — S80212A Abrasion, left knee, initial encounter: Secondary | ICD-10-CM | POA: Insufficient documentation

## 2021-11-16 DIAGNOSIS — M25562 Pain in left knee: Secondary | ICD-10-CM | POA: Diagnosis not present

## 2021-11-16 DIAGNOSIS — M25462 Effusion, left knee: Secondary | ICD-10-CM | POA: Diagnosis not present

## 2021-11-16 DIAGNOSIS — S8002XA Contusion of left knee, initial encounter: Secondary | ICD-10-CM | POA: Insufficient documentation

## 2021-11-16 DIAGNOSIS — S8992XA Unspecified injury of left lower leg, initial encounter: Secondary | ICD-10-CM | POA: Diagnosis not present

## 2021-11-16 MED ORDER — HYDROCODONE-ACETAMINOPHEN 5-325 MG PO TABS
1.0000 | ORAL_TABLET | ORAL | Status: AC
Start: 2021-11-16 — End: 2021-11-16
  Administered 2021-11-16: 1 via ORAL
  Filled 2021-11-16: qty 1

## 2021-11-16 MED ORDER — HYDROCODONE-ACETAMINOPHEN 10-325 MG PO TABS: 0.5000 | ORAL_TABLET | Freq: Four times a day (QID) | ORAL | 0 refills | Status: DC | PRN

## 2021-11-16 NOTE — ED Provider Notes (Signed)
?[place] EMERGENCY DEPARTMENT ?Provider Note ? ? ?CSN: [id number] ?Arrival date & time: 11/16/21  1723 ? ?  ? ?History ? ?Chief Complaint  ?Patient presents with  ? Knee Injury  ? ? ?Michele Long is a 71 y.o. female presents to the emergency department for evaluation of left knee pain.  Just prior to arrival patient tripped and fell landing on her left knee.  She suffered a mechanical fall.  Has left anterior knee pain.  She developed some swelling.  She has been able to bear weight on the left leg but not walking.  She denies feeling or hearing a pop catch or click.  She denies any groin or thigh pain.  No other injury to her body.  She has not had any medications for pain. ? ?HPI ? ?  ? ?Home Medications ?Prior to Admission medications   ?Medication Sig Start Date End Date Taking? Authorizing Provider  ?HYDROcodone-acetaminophen (NORCO) 10-325 MG tablet Take 0.5 tablets by mouth every 6 (six) hours as needed. 11/16/21  Yes Duanne Guess, PA-C  ?buPROPion (WELLBUTRIN XL) 150 MG 24 hr tablet TAKE 2 TABLETS BY MOUTH DAILY 07/09/21   Leone Haven, MD  ?citalopram (CELEXA) 40 MG tablet TAKE 1 TABLET BY MOUTH EVERY DAY 07/09/21   Leone Haven, MD  ?furosemide (LASIX) 20 MG tablet TAKE 1 TABLET BY MOUTH EVERY DAY 07/09/21   Leone Haven, MD  ?hydrOXYzine (ATARAX/VISTARIL) 10 MG tablet Take 1 tablet (10 mg total) by mouth 3 (three) times daily as needed for anxiety. 03/31/20   Leone Haven, MD  ?ibuprofen (ADVIL,MOTRIN) 600 MG tablet TAKE 1 TABLET BY MOUTH EVERY 8 HOURS AS NEEDED. 02/27/18   Leone Haven, MD  ?ondansetron (ZOFRAN-ODT) 4 MG disintegrating tablet Take 1 tablet (4 mg total) by mouth every 8 (eight) hours as needed. 06/02/21   Versie Starks, PA-C  ?pantoprazole (PROTONIX) 40 MG tablet TAKE 1 TABLET BY MOUTH EVERY DAY 07/09/21   Leone Haven, MD  ?traMADol (ULTRAM) 50 MG tablet Take 1 tablet (50 mg total) by mouth every 6 (six) hours as needed. 06/02/21    Versie Starks, PA-C  ?   ? ?Allergies    ?Erythromycin and Sulfa antibiotics   ? ?Review of Systems   ?Review of Systems ? ?Physical Exam ?Updated Vital Signs ?BP (!) 153/79 (BP Location: Right Arm)   Pulse 78   Temp 98.3 ?F (36.8 ?C) (Oral)   Resp 17   Ht '5\' 5"'$  (1.651 m)   Wt 99.8 kg   SpO2 93%   BMI 36.61 kg/m?  ?Physical Exam ?Constitutional:   ?   Appearance: She is well-developed.  ?HENT:  ?   Head: Normocephalic and atraumatic.  ?   Right Ear: External ear normal.  ?   Left Ear: External ear normal.  ?   Nose: Nose normal.  ?Eyes:  ?   Conjunctiva/sclera: Conjunctivae normal.  ?   Pupils: Pupils are equal, round, and reactive to light.  ?Cardiovascular:  ?   Rate and Rhythm: Normal rate.  ?Pulmonary:  ?   Effort: Pulmonary effort is normal. No respiratory distress.  ?   Breath sounds: Normal breath sounds.  ?Abdominal:  ?   Palpations: Abdomen is soft.  ?   Tenderness: There is no abdominal tenderness.  ?Musculoskeletal:  ?   Cervical back: Normal range of motion.  ?   Comments: Left knee with superficial abrasion to the anterior knee, she is  able to actively straight leg raise the lower leg and maintain full active extension.  No palpable defect in quad tendon or patellar tendon.  Patella tracks well.  She is tender along the tibial tubercle, has no laxity with valgus or varus stress testing.  Patella stable.  She is able to flex the knee to 90 degrees with some discomfort, slight swelling throughout the left knee.  No warmth or redness.  Hip moves well with internal ex rotation with no discomfort.  She has negative Homans' sign.  ?Skin: ?   General: Skin is warm and dry.  ?   Findings: No rash.  ?Neurological:  ?   Mental Status: She is alert and oriented to person, place, and time.  ?   Cranial Nerves: No cranial nerve deficit.  ?   Coordination: Coordination normal.  ?Psychiatric:     ?   Behavior: Behavior normal.  ? ? ?ED Results / Procedures / Treatments   ?Labs ?(all labs ordered are listed,  but only abnormal results are displayed) ?Labs Reviewed - No data to display ? ?EKG ?None ? ?Radiology ?DG Knee Complete 4 Views Left ? ?Result Date: 11/16/2021 ?CLINICAL DATA:  Fall trauma pain EXAM: LEFT KNEE - COMPLETE 4+ VIEW COMPARISON:  None. FINDINGS: No acute fracture or dislocation. Trace joint effusion. Alignment is unremarkable. The joint spaces are preserved. No focal soft tissue injury. IMPRESSION: No acute osseous abnormality. Electronically Signed   By: Merilyn Baba M.D.   On: 11/16/2021 17:51   ? ?Procedures ?Procedures  ? ? ?Medications Ordered in ED ?Medications  ?HYDROcodone-acetaminophen (NORCO/VICODIN) 5-325 MG per tablet 1 tablet (has no administration in time range)  ? ? ?ED Course/ Medical Decision Making/ A&P ?  ?                        ?Medical Decision Making ?Amount and/or Complexity of Data Reviewed ?Radiology: ordered. ? ?Risk ?Prescription drug management. ? ? ?71 year old female with acute left knee pain from a fall.  She is able to bear weight on the left lower extremity.  No ligamentous laxity on exam.  X-rays ordered and reviewed by me today show no evidence of acute fracture.  She has no significant effusion on exam.  Patient is placed into a knee immobilizer and recommend she use a walker to help with ambulation.  She will call orthopedic office on Monday to schedule follow-up appointment for recheck.  She will use Tylenol for mild to moderate pain and Norco as needed for severe pain.  She understands signs symptoms return to the ER for ?Final Clinical Impression(s) / ED Diagnoses ?Final diagnoses:  ?Contusion of left knee, initial encounter  ? ? ?Rx / DC Orders ?ED Discharge Orders   ? ?      Ordered  ?  HYDROcodone-acetaminophen (NORCO) 10-325 MG tablet  Every 6 hours PRN       ? 11/16/21 1840  ? ?  ?  ? ?  ? ? ?  ?Renata Caprice ?11/16/21 1854 ? ?  ?Naaman Plummer, MD ?11/17/21 1150 ? ?

## 2021-11-16 NOTE — ED Triage Notes (Signed)
Pt states she tripped and fell today and injured her left knee denies any other injuries ?

## 2021-11-16 NOTE — Discharge Instructions (Signed)
Please rest ice and elevate the knee.  Take Tylenol as needed for mild to moderate pain and Norco as needed for moderate to severe pain.  You may use knee immobilizer as needed for comfort with ambulation and transferring.  Okay to remove knee immobilizer at night when asleep.  If no improvement over the next few days, recommend calling on Monday to schedule follow-up appointment with orthopedics. ?

## 2021-11-26 DIAGNOSIS — M7062 Trochanteric bursitis, left hip: Secondary | ICD-10-CM | POA: Diagnosis not present

## 2021-11-26 DIAGNOSIS — S8002XA Contusion of left knee, initial encounter: Secondary | ICD-10-CM | POA: Insufficient documentation

## 2021-12-27 ENCOUNTER — Other Ambulatory Visit: Payer: Self-pay | Admitting: Family Medicine

## 2021-12-31 DIAGNOSIS — S8002XA Contusion of left knee, initial encounter: Secondary | ICD-10-CM | POA: Diagnosis not present

## 2021-12-31 DIAGNOSIS — M7062 Trochanteric bursitis, left hip: Secondary | ICD-10-CM | POA: Diagnosis not present

## 2022-01-22 ENCOUNTER — Other Ambulatory Visit: Payer: Self-pay | Admitting: Family Medicine

## 2022-01-31 ENCOUNTER — Other Ambulatory Visit: Payer: Self-pay | Admitting: Family Medicine

## 2022-02-06 ENCOUNTER — Ambulatory Visit (INDEPENDENT_AMBULATORY_CARE_PROVIDER_SITE_OTHER): Payer: PPO | Admitting: Family Medicine

## 2022-02-06 ENCOUNTER — Encounter: Payer: Self-pay | Admitting: Family Medicine

## 2022-02-06 VITALS — BP 130/70 | HR 66 | Temp 98.5°F | Ht 66.0 in | Wt 215.4 lb

## 2022-02-06 DIAGNOSIS — E559 Vitamin D deficiency, unspecified: Secondary | ICD-10-CM | POA: Diagnosis not present

## 2022-02-06 DIAGNOSIS — K219 Gastro-esophageal reflux disease without esophagitis: Secondary | ICD-10-CM

## 2022-02-06 DIAGNOSIS — Z13220 Encounter for screening for lipoid disorders: Secondary | ICD-10-CM

## 2022-02-06 DIAGNOSIS — F418 Other specified anxiety disorders: Secondary | ICD-10-CM

## 2022-02-06 DIAGNOSIS — M7062 Trochanteric bursitis, left hip: Secondary | ICD-10-CM

## 2022-02-06 DIAGNOSIS — R42 Dizziness and giddiness: Secondary | ICD-10-CM | POA: Diagnosis not present

## 2022-02-06 DIAGNOSIS — Z6834 Body mass index (BMI) 34.0-34.9, adult: Secondary | ICD-10-CM

## 2022-02-06 DIAGNOSIS — E6609 Other obesity due to excess calories: Secondary | ICD-10-CM | POA: Diagnosis not present

## 2022-02-06 DIAGNOSIS — M7071 Other bursitis of hip, right hip: Secondary | ICD-10-CM | POA: Insufficient documentation

## 2022-02-06 DIAGNOSIS — M7061 Trochanteric bursitis, right hip: Secondary | ICD-10-CM

## 2022-02-06 LAB — CBC
HCT: 41.3 % (ref 36.0–46.0)
Hemoglobin: 13.7 g/dL (ref 12.0–15.0)
MCHC: 33.1 g/dL (ref 30.0–36.0)
MCV: 89 fl (ref 78.0–100.0)
Platelets: 180 10*3/uL (ref 150.0–400.0)
RBC: 4.64 Mil/uL (ref 3.87–5.11)
RDW: 14.3 % (ref 11.5–15.5)
WBC: 6 10*3/uL (ref 4.0–10.5)

## 2022-02-06 LAB — LIPID PANEL
Cholesterol: 196 mg/dL (ref 0–200)
HDL: 63.9 mg/dL (ref >39.00)
LDL Cholesterol: 114 mg/dL — ABNORMAL HIGH (ref 0–99)
NonHDL: 132.47
Total CHOL/HDL Ratio: 3
Triglycerides: 91 mg/dL (ref 0.0–149.0)
VLDL: 18.2 mg/dL (ref 0.0–40.0)

## 2022-02-06 LAB — COMPREHENSIVE METABOLIC PANEL
ALT: 10 U/L (ref 0–35)
AST: 13 U/L (ref 0–37)
Albumin: 4.3 g/dL (ref 3.5–5.2)
Alkaline Phosphatase: 45 U/L (ref 39–117)
BUN: 22 mg/dL (ref 6–23)
CO2: 26 mEq/L (ref 19–32)
Calcium: 9.4 mg/dL (ref 8.4–10.5)
Chloride: 107 mEq/L (ref 96–112)
Creatinine, Ser: 1.09 mg/dL (ref 0.40–1.20)
GFR: 51.75 mL/min — ABNORMAL LOW (ref >60.00)
Glucose, Bld: 88 mg/dL (ref 70–99)
Potassium: 4.2 mEq/L (ref 3.5–5.1)
Sodium: 140 mEq/L (ref 135–145)
Total Bilirubin: 1.3 mg/dL — ABNORMAL HIGH (ref 0.2–1.2)
Total Protein: 6.5 g/dL (ref 6.0–8.3)

## 2022-02-06 LAB — HEMOGLOBIN A1C: Hgb A1c MFr Bld: 5.8 % (ref 4.6–6.5)

## 2022-02-06 LAB — VITAMIN D 25 HYDROXY (VIT D DEFICIENCY, FRACTURES): VITD: 24.04 ng/mL — ABNORMAL LOW (ref 30.00–100.00)

## 2022-02-06 LAB — TSH: TSH: 2.81 u[IU]/mL (ref 0.35–5.50)

## 2022-02-06 NOTE — Assessment & Plan Note (Signed)
Continue Protonix 40 mg daily.  Refer to GI given intermittent dysphagia.  Discussed the need for an EGD.

## 2022-02-06 NOTE — Assessment & Plan Note (Signed)
Generally improved since I saw her several years ago.  She will continue on Wellbutrin 300 mg daily and Celexa 40 mg daily.  She defers seeing a therapist.  She will monitor.

## 2022-02-06 NOTE — Assessment & Plan Note (Signed)
Check vitamin D. 

## 2022-02-06 NOTE — Progress Notes (Signed)
Tommi Rumps, MD Phone: [phone]  Michele Long is a 71 y.o. female who presents today for f/u.  GERD:   Reflux symptoms: only when she misses her medication   Abd pain: no, does note some occasional bloating, she takes gas-x and this improves   Blood in stool: no  Dysphagia: occasionally   EGD: decades agoe  Medication: protonix  Anxiety/depression: Patient notes this has improved from a 10 out of 10 at the worst now up to a 4 out of 10.  She is happy with how controlled this is.  She continues on Wellbutrin and Celexa.  She rarely uses hydroxyzine.  No SI.  Obesity: Patient has recently started eating less carbohydrates and eating more fruits and vegetables.  She is eating more protein.  No soda or sweet tea.  She stays active though does not do any specific exercise.  Hip bursitis: Patient has been seeing orthopedics for this.  This limits her ability to walk for exercise.  Falls/lightheadedness: Patient has had a few falls since I last saw her.  She notes she will occasionally trip over something or she will get lightheaded and her balance will be off with this.  No vertigo.  Notes the lightheadedness typically occurs when she has been rising from a seated position.  Occasionally it occurs with head movement.  She does take Lasix most days during the summer.  She only drinks 16 ounces of water daily.  Social History   Tobacco Use  Smoking Status Never  Smokeless Tobacco Never    Current Outpatient Medications on File Prior to Visit  Medication Sig Dispense Refill   buPROPion (WELLBUTRIN XL) 150 MG 24 hr tablet TAKE 2 TABLETS BY MOUTH DAILY 60 tablet 1   citalopram (CELEXA) 40 MG tablet TAKE 1 TABLET BY MOUTH EVERY DAY 90 tablet 1   furosemide (LASIX) 20 MG tablet TAKE 1 TABLET BY MOUTH EVERY DAY 90 tablet 1   HYDROcodone-acetaminophen (NORCO) 10-325 MG tablet Take 0.5 tablets by mouth every 6 (six) hours as needed. 20 tablet 0   hydrOXYzine (ATARAX) 10 MG tablet TAKE 1  TABLET (10 MG TOTAL) BY MOUTH 3 (THREE) TIMES DAILY AS NEEDED FOR ANXIETY. 30 tablet 0   ibuprofen (ADVIL,MOTRIN) 600 MG tablet TAKE 1 TABLET BY MOUTH EVERY 8 HOURS AS NEEDED. 30 tablet 0   meloxicam (MOBIC) 15 MG tablet Take 15 mg by mouth daily.     methocarbamol (ROBAXIN) 500 MG tablet Take 500 mg by mouth 3 (three) times daily.     ondansetron (ZOFRAN-ODT) 4 MG disintegrating tablet Take 1 tablet (4 mg total) by mouth every 8 (eight) hours as needed. 20 tablet 0   pantoprazole (PROTONIX) 40 MG tablet TAKE 1 TABLET BY MOUTH EVERY DAY 30 tablet 1   traMADol (ULTRAM) 50 MG tablet Take 1 tablet (50 mg total) by mouth every 6 (six) hours as needed. 15 tablet 0   No current facility-administered medications on file prior to visit.     ROS see history of present illness  Objective  Physical Exam Vitals:   02/06/22 0909  BP: 130/70  Pulse: 66  Temp: 98.5 F (36.9 C)  SpO2: 97%   Lying blood pressure 163/81 pulse 64 Sitting blood pressure 155/90 pulse 68 Standing blood pressure 134/84 pulse 73  BP Readings from Last 3 Encounters:  02/06/22 130/70  11/16/21 (!) 153/79  06/02/21 (!) 151/73   Wt Readings from Last 3 Encounters:  02/06/22 215 lb 6.4 oz (97.7 kg)  11/16/21  220 lb (99.8 kg)  06/02/21 220 lb (99.8 kg)    Physical Exam Constitutional:      General: She is not in acute distress.    Appearance: She is not diaphoretic.  Cardiovascular:     Rate and Rhythm: Normal rate and regular rhythm.     Heart sounds: Normal heart sounds.  Pulmonary:     Effort: Pulmonary effort is normal.     Breath sounds: Normal breath sounds.  Abdominal:     General: Bowel sounds are normal. There is no distension.     Palpations: Abdomen is soft.     Tenderness: There is no abdominal tenderness.  Skin:    General: Skin is warm and dry.  Neurological:     Mental Status: She is alert.      Assessment/Plan: Please see individual problem list.  Problem List Items Addressed This  Visit     Depression with anxiety (Chronic)    Generally improved since I saw her several years ago.  She will continue on Wellbutrin 300 mg daily and Celexa 40 mg daily.  She defers seeing a therapist.  She will monitor.      GERD (gastroesophageal reflux disease) - Primary (Chronic)    Continue Protonix 40 mg daily.  Refer to GI given intermittent dysphagia.  Discussed the need for an EGD.      Relevant Orders   Ambulatory referral to Gastroenterology   Obesity (Chronic)    Encouraged adding in some exercise.  Discussed continuing with healthy diet.      Relevant Orders   HgB A1c   Vitamin D deficiency (Chronic)    Check vitamin D.      Relevant Orders   Vitamin D (25 hydroxy)   Bursitis of both hips    She will continue to see orthopedics.      Lightheadedness    Related to orthostasis.  Discussed the patient is not drinking enough water.  I encouraged her to get at least 48 ounces of water daily and if she could increase up to 64 ounces that would be great.  This is likely contributing to some of her falls.      Relevant Orders   TSH   CBC   Other Visit Diagnoses     Lipid screening       Relevant Orders   Comp Met (CMET)   Lipid panel       Return in about 3 months (around 05/09/2022) for Weight follow-up.   Tommi Rumps, MD [place]

## 2022-02-06 NOTE — Patient Instructions (Signed)
Nice to see you. You need to increase your water intake significantly.  Please drink 48 ounces of water daily and if he can increase to 64 ounces that would be great. We will get lab work today and contact you with the results GI should contact you to schedule an event to evaluate your reflux and swallowing issues.  You may also be due for colonoscopy.

## 2022-02-06 NOTE — Assessment & Plan Note (Signed)
She will continue to see orthopedics. 

## 2022-02-06 NOTE — Assessment & Plan Note (Signed)
Encouraged adding in some exercise.  Discussed continuing with healthy diet.

## 2022-02-06 NOTE — Assessment & Plan Note (Signed)
Related to orthostasis.  Discussed the patient is not drinking enough water.  I encouraged her to get at least 48 ounces of water daily and if she could increase up to 64 ounces that would be great.  This is likely contributing to some of her falls.

## 2022-02-07 ENCOUNTER — Other Ambulatory Visit: Payer: Self-pay | Admitting: Family

## 2022-02-07 MED ORDER — VITAMIN D (ERGOCALCIFEROL) 1.25 MG (50000 UNIT) PO CAPS: 50000.0000 [IU] | ORAL_CAPSULE | ORAL | 0 refills | Status: DC

## 2022-02-25 ENCOUNTER — Telehealth: Payer: Self-pay | Admitting: Family Medicine

## 2022-02-25 NOTE — Telephone Encounter (Signed)
Pt called stating she tested positive for covid on yesterday with a  home test. Pt would like a medication called in

## 2022-02-26 ENCOUNTER — Ambulatory Visit: Payer: PPO

## 2022-02-26 ENCOUNTER — Telehealth (INDEPENDENT_AMBULATORY_CARE_PROVIDER_SITE_OTHER): Payer: PPO | Admitting: Family

## 2022-02-26 ENCOUNTER — Encounter: Payer: Self-pay | Admitting: Family

## 2022-02-26 VITALS — Ht 66.0 in | Wt 215.0 lb

## 2022-02-26 DIAGNOSIS — U071 COVID-19: Secondary | ICD-10-CM | POA: Diagnosis not present

## 2022-02-26 MED ORDER — BENZONATATE 100 MG PO CAPS: 100.0000 mg | ORAL_CAPSULE | Freq: Three times a day (TID) | ORAL | 0 refills | Status: DC | PRN

## 2022-02-26 MED ORDER — NIRMATRELVIR/RITONAVIR (PAXLOVID) TABLET (RENAL DOSING)
2.0000 | ORAL_TABLET | Freq: Two times a day (BID) | ORAL | 0 refills | Status: AC
Start: 2022-02-26 — End: 2022-03-03

## 2022-02-26 NOTE — Patient Instructions (Signed)
Please stay in quarantine per cdc guidelines.   If you test positive for COVID-19, stay home for at least 5 days and isolate from others in your home. You are likely most infectious during these first 5 days. Wear a high-quality mask if you must be around others at home and in public. Do not go places where you are unable to wear a mask.  We discussed starting Paxlovid which is an unapproved drug that is authorized for use under an Emergency Use Authorization.  There are no adequate, approved, available products for the treatment of COVID-19 in adults who have mild-to-moderate COVID-19 and are at high risk for progressing to severe COVID-19, including hospitalization or death.  There are benefits and risks of taking this treatment as outlined in the "Fact Sheet for Patients and Caregivers." You may find this document here and please read in detail   https://www.fda.gov/media/155051/download   I have sent Paxlovid to your pharmacy. Please call pharmacy so they bring medication out to your car and you do not have to go inside.   PAXLOVID ADMINISTRATION INSTRUCTIONS:  Take with or without food. Swallow the tablets whole. Don't chew, crush, or break the medications because it might not work as well  For each dose of the medication, you should be taking 3 tablets together (2 pink oval and 1 white oval) TWICE a day for FIVE days   Finish your full five-day course of Paxlovid even if you feel better before you're done. Stopping this medication too early can make it less effective to prevent severe illness related to COVID19.    Paxlovid is prescribed for YOU ONLY. Don't share it with others, even if they have similar symptoms as you. This medication might not be right for everyone.  Make sure to take steps to protect yourself and others while you're taking this medication in order to get well soon and to prevent others from getting sick with COVID-19.  Paxlovid (nirmatrelvir / ritonavir) can cause  hormonal birth control medications to not work well. If you or your partner is currently taking hormonal birth control, use condoms or other birth control methods to prevent unintended pregnancies.   COMMON SIDE EFFECTS: Altered or bad taste in your mouth  Diarrhea  High blood pressure (1% of people) Muscle aches (1% of people)    If your COVID-19 symptoms get worse, get medical help right away. Call 911 if you experience symptoms such as worsening cough, trouble breathing, chest pain that doesn't go away, confusion, a hard time staying awake, and pale or blue-colored skin.This medication won't prevent all COVID-19 cases from getting worse.   

## 2022-02-26 NOTE — Progress Notes (Signed)
Virtual Visit via Video Note  I connected with@  on 02/26/22 at 11:15 AM EDT by a video enabled telemedicine application and verified that I am speaking with the correct person using two identifiers.  Location patient: home Location provider:work  Persons participating in the virtual visit: patient, provider  I discussed the limitations of evaluation and management by telemedicine and the availability of in person appointments. The patient expressed understanding and agreed to proceed.   HPI: Acute visit for COVID-positive Test was positive 2 days ago .  Symptoms started abruptly 2 days ago Endorses fever, fatigue, generalized body aches, cough and headache.  She has been taking Sudafed and Tylenol with some improvement with sinus congestion. Temperature 96 on tylenol ( last dose this morning) .   She is drinking water. Lives with daughter.  She has covid in the past and doesn't 'feel nearly as bad'. She hasnt the covid booster.    GFR 51 History of GERD, depression, DVT  ROS: See pertinent positives and negatives per HPI.    EXAM:  VITALS per patient if applicable: Ht '5\' 6"'  (1.676 m)   Wt 215 lb (97.5 kg)   BMI 34.70 kg/m  BP Readings from Last 3 Encounters:  02/06/22 130/70  11/16/21 (!) 153/79  06/02/21 (!) 151/73   Wt Readings from Last 3 Encounters:  02/26/22 215 lb (97.5 kg)  02/06/22 215 lb 6.4 oz (97.7 kg)  11/16/21 220 lb (99.8 kg)    GENERAL: alert, oriented, appears well and in no acute distress  HEENT: atraumatic, conjunttiva clear, no obvious abnormalities on inspection of external nose and ears  NECK: normal movements of the head and neck  LUNGS: on inspection no signs of respiratory distress, breathing rate appears normal, no obvious gross SOB, gasping or wheezing  CV: no obvious cyanosis  MS: moves all visible extremities without noticeable abnormality  PSYCH/NEURO: pleasant and cooperative, no obvious depression or anxiety, speech and thought  processing grossly intact  ASSESSMENT AND PLAN:  Discussed the following assessment and plan:  Problem List Items Addressed This Visit       Other   COVID-19 - Primary    No acute respiratory distress.  Counseled on lacking long term safely and effectiveness data of medication, Paxlovid. Explained EUA for Paxlovid. Criteria met for consideration of Paxlovid,  patient older than 12 years and weight > 40kg, started within 5 days of symptom onset and risk factor for severe disease include:  Age > 65, no covid bivalant booster GFR 51 and paxlovid renally dosed.  Counseled on adverse effects including altered taste, diarrhea, HTN, and myalgia.    Patient is most comfortable and desires to start Paxlovid and understands to call me with concerns or new symptoms.        Relevant Medications   benzonatate (TESSALON) 100 MG capsule   nirmatrelvir/ritonavir EUA, renal dosing, (PAXLOVID) 10 x 150 MG & 10 x 100MG TABS    -we discussed possible serious and likely etiologies, options for evaluation and workup, limitations of telemedicine visit vs in person visit, treatment, treatment risks and precautions. Pt prefers to treat via telemedicine empirically rather then risking or undertaking an in person visit at this moment.  .   I discussed the assessment and treatment plan with the patient. The patient was provided an opportunity to ask questions and all were answered. The patient agreed with the plan and demonstrated an understanding of the instructions.   The patient was advised to call back or seek an  in-person evaluation if the symptoms worsen or if the condition fails to improve as anticipated.   Mable Paris, FNP

## 2022-02-26 NOTE — Telephone Encounter (Signed)
I called the patient and she is scheduled with M. Arnette this morning to be see virtually. Yaslin Kirtley,ca

## 2022-02-26 NOTE — Assessment & Plan Note (Signed)
No acute respiratory distress.  Counseled on lacking long term safely and effectiveness data of medication, Paxlovid. Explained EUA for Paxlovid. Criteria met for consideration of Paxlovid,  patient older than 12 years and weight > 40kg, started within 5 days of symptom onset and risk factor for severe disease include:  Age > 65, no covid bivalant booster GFR 51 and paxlovid renally dosed.  Counseled on adverse effects including altered taste, diarrhea, HTN, and myalgia.    Patient is most comfortable and desires to start Paxlovid and understands to call me with concerns or new symptoms.

## 2022-03-27 ENCOUNTER — Ambulatory Visit (INDEPENDENT_AMBULATORY_CARE_PROVIDER_SITE_OTHER): Payer: PPO

## 2022-03-27 VITALS — Ht 66.0 in | Wt 215.0 lb

## 2022-03-27 DIAGNOSIS — Z0000 Encounter for general adult medical examination without abnormal findings: Secondary | ICD-10-CM | POA: Diagnosis not present

## 2022-03-27 NOTE — Progress Notes (Signed)
Subjective:   Michele Long is a 71 y.o. female who presents for Medicare Annual (Subsequent) preventive examination.  Review of Systems    No ROS.  Medicare Wellness Virtual Visit.  Visual/audio telehealth visit, UTA vital signs.   See social history for additional risk factors.   Cardiac Risk Factors include: advanced age (>76mn, >>19women)     Objective:    Today's Vitals   03/27/22 1008  Weight: 215 lb (97.5 kg)  Height: '5\' 6"'$  (1.676 m)   Body mass index is 34.7 kg/m.     03/27/2022   10:17 AM 11/16/2021    5:27 PM 06/02/2021    2:41 PM 02/13/2021   11:29 AM 02/11/2020   12:34 PM 02/10/2019   12:12 PM 01/28/2018    9:11 AM  Advanced Directives  Does Patient Have a Medical Advance Directive? No No No No Yes No No  Does patient want to make changes to medical advance directive?     No - Patient declined    Would patient like information on creating a medical advance directive? No - Patient declined No - Patient declined  No - Patient declined  Yes (MAU/Ambulatory/Procedural Areas - Information given)     Current Medications (verified) Outpatient Encounter Medications as of 03/27/2022  Medication Sig   benzonatate (TESSALON) 100 MG capsule Take 1 capsule (100 mg total) by mouth 3 (three) times daily as needed for cough.   buPROPion (WELLBUTRIN XL) 150 MG 24 hr tablet TAKE 2 TABLETS BY MOUTH DAILY   citalopram (CELEXA) 40 MG tablet TAKE 1 TABLET BY MOUTH EVERY DAY   furosemide (LASIX) 20 MG tablet TAKE 1 TABLET BY MOUTH EVERY DAY (Patient taking differently: Take 20 mg by mouth as needed.)   HYDROcodone-acetaminophen (NORCO) 10-325 MG tablet Take 0.5 tablets by mouth every 6 (six) hours as needed.   hydrOXYzine (ATARAX) 10 MG tablet TAKE 1 TABLET (10 MG TOTAL) BY MOUTH 3 (THREE) TIMES DAILY AS NEEDED FOR ANXIETY. (Patient taking differently: Take 10 mg by mouth 2 (two) times daily as needed for anxiety.)   meloxicam (MOBIC) 15 MG tablet Take 15 mg by mouth daily.    methocarbamol (ROBAXIN) 500 MG tablet Take 500 mg by mouth 3 (three) times daily.   ondansetron (ZOFRAN-ODT) 4 MG disintegrating tablet Take 1 tablet (4 mg total) by mouth every 8 (eight) hours as needed.   pantoprazole (PROTONIX) 40 MG tablet TAKE 1 TABLET BY MOUTH EVERY DAY   traMADol (ULTRAM) 50 MG tablet Take 1 tablet (50 mg total) by mouth every 6 (six) hours as needed.   Vitamin D, Ergocalciferol, (DRISDOL) 1.25 MG (50000 UNIT) CAPS capsule Take 1 capsule (50,000 Units total) by mouth every 7 (seven) days.   No facility-administered encounter medications on file as of 03/27/2022.    Allergies (verified) Erythromycin and Sulfa antibiotics   History: Past Medical History:  Diagnosis Date   Allergy    Depression    DVT (deep venous thrombosis) (HCC)    Edema    Frequent headaches    Gastric ulcer 10/27/2015   GERD (gastroesophageal reflux disease)    HTN (hypertension)    Microalbuminuria    Migraine    Monocular diplopia of right eye 10/22/2016   Shingles    Symptomatic menopausal or female climacteric states    UTI (lower urinary tract infection)    Past Surgical History:  Procedure Laterality Date   AUGMENTATION MAMMAPLASTY Bilateral [place]  NASAL  SEPTUM SURGERY  2000   Family History  Problem Relation Age of Onset   Arthritis Mother    Hypertension Mother    Hypertension Father    Osteoarthritis Father    Migraines Father    Cancer Maternal Grandmother    Diabetes Maternal Grandfather    Heart disease Maternal Grandfather    Arthritis Paternal Grandmother    Cancer Paternal Grandfather        Prostate   Pancreatic cancer Paternal Grandfather    Diabetes Brother    Social History   Socioeconomic History   Marital status: Widowed    Spouse name: Not on file   Number of children: Not on file   Years of education: Not on file   Highest education level: Not on file  Occupational History   Not on file  Tobacco Use    Smoking status: Never   Smokeless tobacco: Never  Vaping Use   Vaping Use: Never used  Substance and Sexual Activity   Alcohol use: No    Alcohol/week: 0.0 standard drinks of alcohol   Drug use: No   Sexual activity: Never  Other Topics Concern   Not on file  Social History Narrative   2 Children (1 daughter 71) and 1 son passed away at 7 months    2 dogs    Caffeine 1 cup coffee   Widower    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    Social Determinants of Health   Financial Resource Strain: [place]  (03/27/2022)   Overall Financial Resource Strain (CARDIA)    Difficulty of Paying Living Expenses: Not hard at all  Food Insecurity: No Food Insecurity (03/27/2022)   Hunger Vital Sign    Worried About Running Out of Food in the Last Year: Never true    [place] in the Last Year: Never true  Transportation Needs: No Transportation Needs (03/27/2022)   PRAPARE - Hydrologist (Medical): No    Lack of Transportation (Non-Medical): No  Physical Activity: Not on file  Stress: No Stress Concern Present (03/27/2022)   [place]    Feeling of Stress : Only a little  Social Connections: Unknown (03/27/2022)   Social Connection and Isolation Panel [NHANES]    Frequency of Communication with Friends and Family: Twice a week    Frequency of Social Gatherings with Friends and Family: More than three times a week    Attends Religious Services: Not on Advertising copywriter or Organizations: Not on file    Attends Archivist Meetings: Not on file    Marital Status: Married    Tobacco Counseling Counseling given: Not Answered   Clinical Intake:  Pre-visit preparation completed: Yes        Diabetes: No  How often do you need to have someone help you when you read instructions, pamphlets, or other written materials from your doctor or pharmacy?: 1 - Never   Interpreter  Needed?: No    Activities of Daily Living    03/27/2022   10:20 AM  In your present state of health, do you have any difficulty performing the following activities:  Hearing? 1  Vision? 0  Difficulty concentrating or making decisions? 0  Walking or climbing stairs? 0  Dressing or bathing? 0  Doing errands, shopping? 0  Preparing Food and eating ? N  Using the Toilet? N  In the  past six months, have you accidently leaked urine? N  Do you have problems with loss of bowel control? N  Managing your Medications? N  Managing your Finances? N  Housekeeping or managing your Housekeeping? N    Patient Care Team: Leone Haven, MD as PCP - General (Family Medicine)  Indicate any recent Medical Services you may have received from other than [facility] providers in the past year (date may be approximate).     Assessment:   This is a routine wellness examination for [employer].  Virtual Visit via Telephone Note  I connected with  [name] on 03/27/22 at 10:00 AM EDT by telephone and verified that I am speaking with the correct person using two identifiers.  Location: Patient: home Provider: office Persons participating in the virtual visit: patient/Nurse Health Advisor   I discussed the limitations of performing an evaluation and management service by telehealth. We continued and completed visit with audio only. Some vital signs may be absent or patient reported.   Hearing/Vision screen Hearing Screening - Comments:: Hearing aid, bilateral  Vision Screening - Comments:: Wears corrective lenses  Plans to [facility] with [facility]  Dietary issues and exercise activities discussed: Current Exercise Habits: Home exercise routine, Intensity: Mild Regular diet Fair water intake    Goals Addressed               This Visit's Progress     Patient Stated     I can walk more for exercise (pt-stated)        Stay hydrated; drink more water       Depression Screen     03/27/2022   10:14 AM 02/06/2022    9:13 AM 02/13/2021   11:26 AM 03/31/2020    3:41 PM 02/11/2020   11:49 AM 02/10/2019   12:13 PM 07/13/2018    3:20 PM  PHQ 2/9 Scores  PHQ - 2 Score 0 0 0 4 0    PHQ- 9 Score    17     Exception Documentation      Medical reason Medical reason    Fall Risk    03/27/2022   10:19 AM 02/11/2020   11:49 AM 02/10/2019   12:13 PM 07/13/2018    3:20 PM 01/06/2018    9:29 AM  [place] in the past year? 0 0 0 0 No  Number falls in past yr: 0 0  0   Injury with Fall? 0   0   Follow up Falls evaluation completed Falls evaluation completed       [place]: Home free of loose throw rugs in walkways, pet beds, electrical cords, etc? Yes  Adequate lighting in your home to reduce risk of falls? Yes   ASSISTIVE DEVICES UTILIZED TO PREVENT FALLS: Life alert? No  Use of a cane, walker or w/c? No  Grab bars in the bathroom? Yes  Bench in shower? Yes Elevated toilet seat or a handicapped toilet? No   TIMED UP AND GO: Was the test performed? No .   Cognitive Function:        03/27/2022   10:32 AM 02/11/2020   11:57 AM 02/10/2019   12:16 PM  6CIT Screen  What Year? 0 points 0 points 0 points  What month? 0 points 0 points 0 points  What time? 0 points 0 points 0 points  Count back from 20 0 points 0 points 0 points  Months in reverse  0 points 0 points 0 points  Repeat phrase 0 points 0 points 0 points  Total Score 0 points 0 points 0 points    Immunizations Immunization History  Administered Date(s) Administered   Influenza, High Dose Seasonal PF 06/06/2017, 04/22/2018   Influenza,inj,Quad PF,6+ Mos 06/12/2016   PFIZER(Purple Top)SARS-COV-2 Vaccination 04/26/2020, 06/05/2020   Pneumococcal Conjugate-13 05/14/2018   Tdap 06/02/2021   Shingrix Completed?: No.    Education has been provided regarding the importance of this vaccine. Patient has been advised to call insurance company to determine out of pocket  expense if they have not yet received this vaccine. Advised may also receive vaccine at local pharmacy or Health Dept. Verbalized acceptance and understanding.  Screening Tests Health Maintenance  Topic Date Due   COVID-19 Vaccine (3 - Pfizer series) 04/12/2022 (Originally 07/31/2020)   Pneumonia Vaccine 31+ Years old (2 - PPSV23 or PCV20) 04/28/2022 (Originally 05/15/2019)   MAMMOGRAM  04/28/2022 (Originally 10/15/2018)   DEXA SCAN  04/28/2022 (Originally 05/11/2017)   Zoster Vaccines- Shingrix (1 of 2) 06/27/2022 (Originally 05/11/2002)   INFLUENZA VACCINE  10/27/2022 (Originally 02/26/2022)   COLONOSCOPY (Pts 45-65yr Insurance coverage will need to be confirmed)  06/09/2022   TETANUS/TDAP  06/03/2031   Hepatitis C Screening  Completed   HPV VACCINES  Aged Out   Health Maintenance There are no preventive care reminders to display for this patient.  Mammogram- deferred per patient   Bone density- deferred per patient  Lung Cancer Screening: (Low Dose CT Chest recommended if Age 70-80years, 30 pack-year currently smoking OR have quit w/in 15years.) does not qualify.   Vision Screening: Recommended annual ophthalmology exams for early detection of glaucoma and other disorders of the eye.  Dental Screening: Recommended annual dental exams for proper oral hygiene  Community Resource Referral / Chronic Care Management: CRR required this visit?  No   CCM required this visit?  No      Plan:     I have personally reviewed and noted the following in the patient's chart:   Medical and social history Use of alcohol, tobacco or illicit drugs  Current medications and supplements including opioid prescriptions. Patient is currently taking opioid prescriptions. Information provided to patient regarding non-opioid alternatives. Patient advised to discuss non-opioid treatment plan with their provider. Followed by FVersie Starks PA-C and GDuanne Guess PA-C Functional ability and  status Nutritional status Physical activity Advanced directives List of other physicians Hospitalizations, surgeries, and ER visits in previous 12 months Vitals Screenings to include cognitive, depression, and falls Referrals and appointments  In addition, I have reviewed and discussed with patient certain preventive protocols, quality metrics, and best practice recommendations. A written personalized care plan for preventive services as well as general preventive health recommendations were provided to patient.     OVarney Biles LPN   [id number]

## 2022-03-27 NOTE — Patient Instructions (Addendum)
Michele Long , Thank you for taking time to come for your Medicare Wellness Visit. I appreciate your ongoing commitment to your health goals. Please review the following plan we discussed and let me know if I can assist you in the future.   These are the goals we discussed:  Goals       Patient Stated     Follow up with Primary Care Provider (pt-stated)      Follow up with doctor as needed and directed.  Schedule: Mammogram, bone density.       I can walk more for exercise (pt-stated)      Stay hydrated; drink more water        This is a list of the screening recommended for you and due dates:  Health Maintenance  Topic Date Due   COVID-19 Vaccine (3 - Pfizer series) 04/12/2022*   Pneumonia Vaccine (2 - PPSV23 or PCV20) 04/28/2022*   Mammogram  04/28/2022*   DEXA scan (bone density measurement)  04/28/2022*   Zoster (Shingles) Vaccine (1 of 2) 06/27/2022*   Flu Shot  10/27/2022*   Colon Cancer Screening  06/09/2022   Tetanus Vaccine  06/03/2031   Hepatitis C Screening: USPSTF Recommendation to screen - Ages 18-79 yo.  Completed   HPV Vaccine  Aged Out  *Topic was postponed. The date shown is not the original due date.    Opioid Pain Medicine Management Opioids are powerful medicines that are used to treat moderate to severe pain. When used for short periods of time, they can help you to: Sleep better. Do better in physical or occupational therapy. Feel better in the first few days after an injury. Recover from surgery. Opioids should be taken with the supervision of a trained health care provider. They should be taken for the shortest period of time possible. This is because opioids can be addictive, and the longer you take opioids, the greater your risk of addiction. This addiction can also be called opioid use disorder. What are the risks? Using opioid pain medicines for longer than 3 days increases your risk of side effects. Side effects include: Constipation. Nausea and  vomiting. Breathing difficulties (respiratory depression). Drowsiness. Confusion. Opioid use disorder. Itching. Taking opioid pain medicine for a long period of time can affect your ability to do daily tasks. It also puts you at risk for: Motor vehicle crashes. Depression. Suicide. Heart attack. Overdose, which can be life-threatening. What is a pain treatment plan? A pain treatment plan is an agreement between you and your health care provider. Pain is unique to each person, and treatments vary depending on your condition. To manage your pain, you and your health care provider need to work together. To help you do this: Discuss the goals of your treatment, including how much pain you might expect to have and how you will manage the pain. Review the risks and benefits of taking opioid medicines. Remember that a good treatment plan uses more than one approach and minimizes the chance of side effects. Be honest about the amount of medicines you take and about any drug or alcohol use. Get pain medicine prescriptions from only one health care provider. Pain can be managed with many types of alternative treatments. Ask your health care provider to refer you to one or more specialists who can help you manage pain through: Physical or occupational therapy. Counseling (cognitive behavioral therapy). Good nutrition. Biofeedback. Massage. Meditation. Non-opioid medicine. Following a gentle exercise program. How to use opioid pain medicine Taking  medicine Take your pain medicine exactly as told by your health care provider. Take it only when you need it. If your pain gets less severe, you may take less than your prescribed dose if your health care provider approves. If you are not having pain, do nottake pain medicine unless your health care provider tells you to take it. If your pain is severe, do nottry to treat it yourself by taking more pills than instructed on your prescription. Contact  your health care provider for help. Write down the times when you take your pain medicine. It is easy to become confused while on pain medicine. Writing the time can help you avoid overdose. Take other over-the-counter or prescription medicines only as told by your health care provider. Keeping yourself and others safe  While you are taking opioid pain medicine: Do not drive, use machinery, or power tools. Do not sign legal documents. Do not drink alcohol. Do not take sleeping pills. Do not supervise children by yourself. Do not do activities that require climbing or being in high places. Do not go to a lake, river, ocean, spa, or swimming pool. Do not share your pain medicine with anyone. Keep pain medicine in a locked cabinet or in a secure area where pets and children cannot reach it. Stopping your use of opioids If you have been taking opioid medicine for more than a few weeks, you may need to slowly decrease (taper) how much you take until you stop completely. Tapering your use of opioids can decrease your risk of symptoms of withdrawal, such as: Pain and cramping in the abdomen. Nausea. Sweating. Sleepiness. Restlessness. Uncontrollable shaking (tremors). Cravings for the medicine. Do not attempt to taper your use of opioids on your own. Talk with your health care provider about how to do this. Your health care provider may prescribe a step-down schedule based on how much medicine you are taking and how long you have been taking it. Getting rid of leftover pills Do not save any leftover pills. Get rid of leftover pills safely by: Taking the medicine to a prescription take-back program. This is usually offered by the county or law enforcement. Bringing them to a pharmacy that has a drug disposal container. Flushing them down the toilet. Check the label or package insert of your medicine to see whether this is safe to do. Throwing them out in the trash. Check the label or package  insert of your medicine to see whether this is safe to do. If it is safe to throw it out, remove the medicine from the original container, put it into a sealable bag or container, and mix it with used coffee grounds, food scraps, dirt, or cat litter before putting it in the trash. Follow these instructions at home: Activity Do exercises as told by your health care provider. Avoid activities that make your pain worse. Return to your normal activities as told by your health care provider. Ask your health care provider what activities are safe for you. General instructions You may need to take these actions to prevent or treat constipation: Drink enough fluid to keep your urine pale yellow. Take over-the-counter or prescription medicines. Eat foods that are high in fiber, such as beans, whole grains, and fresh fruits and vegetables. Limit foods that are high in fat and processed sugars, such as fried or sweet foods. Keep all follow-up visits. This is important. Where to find support If you have been taking opioids for a long time, you may benefit  from receiving support for quitting from a local support group or counselor. Ask your health care provider for a referral to these resources in your area. Where to find more information Centers for Disease Control and Prevention (CDC): http://www.wolf.info/ [place] Food and Drug Administration (FDA): [place] Get help right away if: You may have taken too much of an opioid (overdosed). Common symptoms of an overdose: Your breathing is slower or more shallow than normal. You have a very slow heartbeat (pulse). You have slurred speech. You have nausea and vomiting. Your pupils become very small. You have other potential symptoms: You are very confused. You faint or feel like you will faint. You have cold, clammy skin. You have blue lips or fingernails. You have thoughts of harming yourself or harming others. These symptoms may represent a serious problem  that is an emergency. Do not wait to see if the symptoms will go away. Get medical help right away. Call your local emergency services (911 in the [place] Do not drive yourself to the hospital.  If you ever feel like you may hurt yourself or others, or have thoughts about taking your own life, get help right away. Go to your nearest emergency department or: Call your local emergency services (911 in the [place] Call the [facility] (216)544-6201 in the [place] Call a suicide crisis helpline, such as the [place] at 253-835-3156 or 988 in the [place]. This is open 24 hours a day in the [place] Text the Crisis Text Line at 754 102 3656 (in the [place] Summary Opioid medicines can help you manage moderate to severe pain for a short period of time. A pain treatment plan is an agreement between you and your health care provider. Discuss the goals of your treatment, including how much pain you might expect to have and how you will manage the pain. If you think that you or someone else may have taken too much of an opioid, get medical help right away. This information is not intended to replace advice given to you by your health care provider. Make sure you discuss any questions you have with your health care provider. Document Revised: 02/07/2021 Document Reviewed: 10/25/2020 Elsevier Patient Education  [place] 65 Years and Older, Female Preventive care refers to lifestyle choices and visits with your health care provider that can promote health and wellness. What does preventive care include? A yearly physical exam. This is also called an annual well check. Dental exams once or twice a year. Routine eye exams. Ask your health care provider how often you should have your eyes checked. Personal lifestyle choices, including: Daily care of your teeth and gums. Regular physical activity. Eating a healthy diet. Avoiding tobacco and drug  use. Limiting alcohol use. Practicing safe sex. Taking low-dose aspirin every day. Taking vitamin and mineral supplements as recommended by your health care provider. What happens during an annual well check? The services and screenings done by your health care provider during your annual well check will depend on your age, overall health, lifestyle risk factors, and family history of disease. Counseling  Your health care provider may ask you questions about your: Alcohol use. Tobacco use. Drug use. Emotional well-being. Home and relationship well-being. Sexual activity. Eating habits. History of falls. Memory and ability to understand (cognition). Work and work Statistician. Reproductive health. Screening  You may have the following tests or measurements: Height, weight, and BMI. Blood pressure. Lipid and cholesterol levels. These may be  checked every 5 years, or more frequently if you are over 5 years old. Skin check. Lung cancer screening. You may have this screening every year starting at age 90 if you have a 30-pack-year history of smoking and currently smoke or have quit within the past 15 years. Fecal occult blood test (FOBT) of the stool. You may have this test every year starting at age 24. Flexible sigmoidoscopy or colonoscopy. You may have a sigmoidoscopy every 5 years or a colonoscopy every 10 years starting at age 80. Hepatitis C blood test. Hepatitis B blood test. Sexually transmitted disease (STD) testing. Diabetes screening. This is done by checking your blood sugar (glucose) after you have not eaten for a while (fasting). You may have this done every 1-3 years. Bone density scan. This is done to screen for osteoporosis. You may have this done starting at age 4. Mammogram. This may be done every 1-2 years. Talk to your health care provider about how often you should have regular mammograms. Talk with your health care provider about your test results, treatment  options, and if necessary, the need for more tests. Vaccines  Your health care provider may recommend certain vaccines, such as: Influenza vaccine. This is recommended every year. Tetanus, diphtheria, and acellular pertussis (Tdap, Td) vaccine. You may need a Td booster every 10 years. Zoster vaccine. You may need this after age 50. Pneumococcal 13-valent conjugate (PCV13) vaccine. One dose is recommended after age 26. Pneumococcal polysaccharide (PPSV23) vaccine. One dose is recommended after age 74. Talk to your health care provider about which screenings and vaccines you need and how often you need them. This information is not intended to replace advice given to you by your health care provider. Make sure you discuss any questions you have with your health care provider. Document Released: 08/11/2015 Document Revised: 04/03/2016 Document Reviewed: 05/16/2015 Elsevier Interactive Patient Education  2017 [place] Prevention in the Home Falls can cause injuries. They can happen to people of all ages. There are many things you can do to make your home safe and to help prevent falls. What can I do on the outside of my home? Regularly fix the edges of walkways and driveways and fix any cracks. Remove anything that might make you trip as you walk through a door, such as a raised step or threshold. Trim any bushes or trees on the path to your home. Use bright outdoor lighting. Clear any walking paths of anything that might make someone trip, such as rocks or tools. Regularly check to see if handrails are loose or broken. Make sure that both sides of any steps have handrails. Any raised decks and porches should have guardrails on the edges. Have any leaves, snow, or ice cleared regularly. Use sand or salt on walking paths during winter. Clean up any spills in your garage right away. This includes oil or grease spills. What can I do in the bathroom? Use night lights. Install grab  bars by the toilet and in the tub and shower. Do not use towel bars as grab bars. Use non-skid mats or decals in the tub or shower. If you need to sit down in the shower, use a plastic, non-slip stool. Keep the floor dry. Clean up any water that spills on the floor as soon as it happens. Remove soap buildup in the tub or shower regularly. Attach bath mats securely with double-sided non-slip rug tape. Do not have throw rugs and other things on the floor that  can make you trip. What can I do in the bedroom? Use night lights. Make sure that you have a light by your bed that is easy to reach. Do not use any sheets or blankets that are too big for your bed. They should not hang down onto the floor. Have a firm chair that has side arms. You can use this for support while you get dressed. Do not have throw rugs and other things on the floor that can make you trip. What can I do in the kitchen? Clean up any spills right away. Avoid walking on wet floors. Keep items that you use a lot in easy-to-reach places. If you need to reach something above you, use a strong step stool that has a grab bar. Keep electrical cords out of the way. Do not use floor polish or wax that makes floors slippery. If you must use wax, use non-skid floor wax. Do not have throw rugs and other things on the floor that can make you trip. What can I do with my stairs? Do not leave any items on the stairs. Make sure that there are handrails on both sides of the stairs and use them. Fix handrails that are broken or loose. Make sure that handrails are as long as the stairways. Check any carpeting to make sure that it is firmly attached to the stairs. Fix any carpet that is loose or worn. Avoid having throw rugs at the top or bottom of the stairs. If you do have throw rugs, attach them to the floor with carpet tape. Make sure that you have a light switch at the top of the stairs and the bottom of the stairs. If you do not have them,  ask someone to add them for you. What else can I do to help prevent falls? Wear shoes that: Do not have high heels. Have rubber bottoms. Are comfortable and fit you well. Are closed at the toe. Do not wear sandals. If you use a stepladder: Make sure that it is fully opened. Do not climb a closed stepladder. Make sure that both sides of the stepladder are locked into place. Ask someone to hold it for you, if possible. Clearly mark and make sure that you can see: Any grab bars or handrails. First and last steps. Where the edge of each step is. Use tools that help you move around (mobility aids) if they are needed. These include: Canes. Walkers. Scooters. Crutches. Turn on the lights when you go into a dark area. Replace any light bulbs as soon as they burn out. Set up your furniture so you have a clear path. Avoid moving your furniture around. If any of your floors are uneven, fix them. If there are any pets around you, be aware of where they are. Review your medicines with your doctor. Some medicines can make you feel dizzy. This can increase your chance of falling. Ask your doctor what other things that you can do to help prevent falls. This information is not intended to replace advice given to you by your health care provider. Make sure you discuss any questions you have with your health care provider. Document Released: 05/11/2009 Document Revised: 12/21/2015 Document Reviewed: 08/19/2014 Elsevier Interactive Patient Education  2017 [employer].

## 2022-05-10 ENCOUNTER — Ambulatory Visit: Payer: PPO | Admitting: Family Medicine

## 2022-05-24 IMAGING — CR DG FEMUR 2+V*R*
4 series · 4 of 4 positions shown · non-contrast
Comparison: None.

CLINICAL DATA: Dog bite to right thigh.

EXAM:
RIGHT FEMUR 2 VIEWS

[femur ap (1 of 2)]
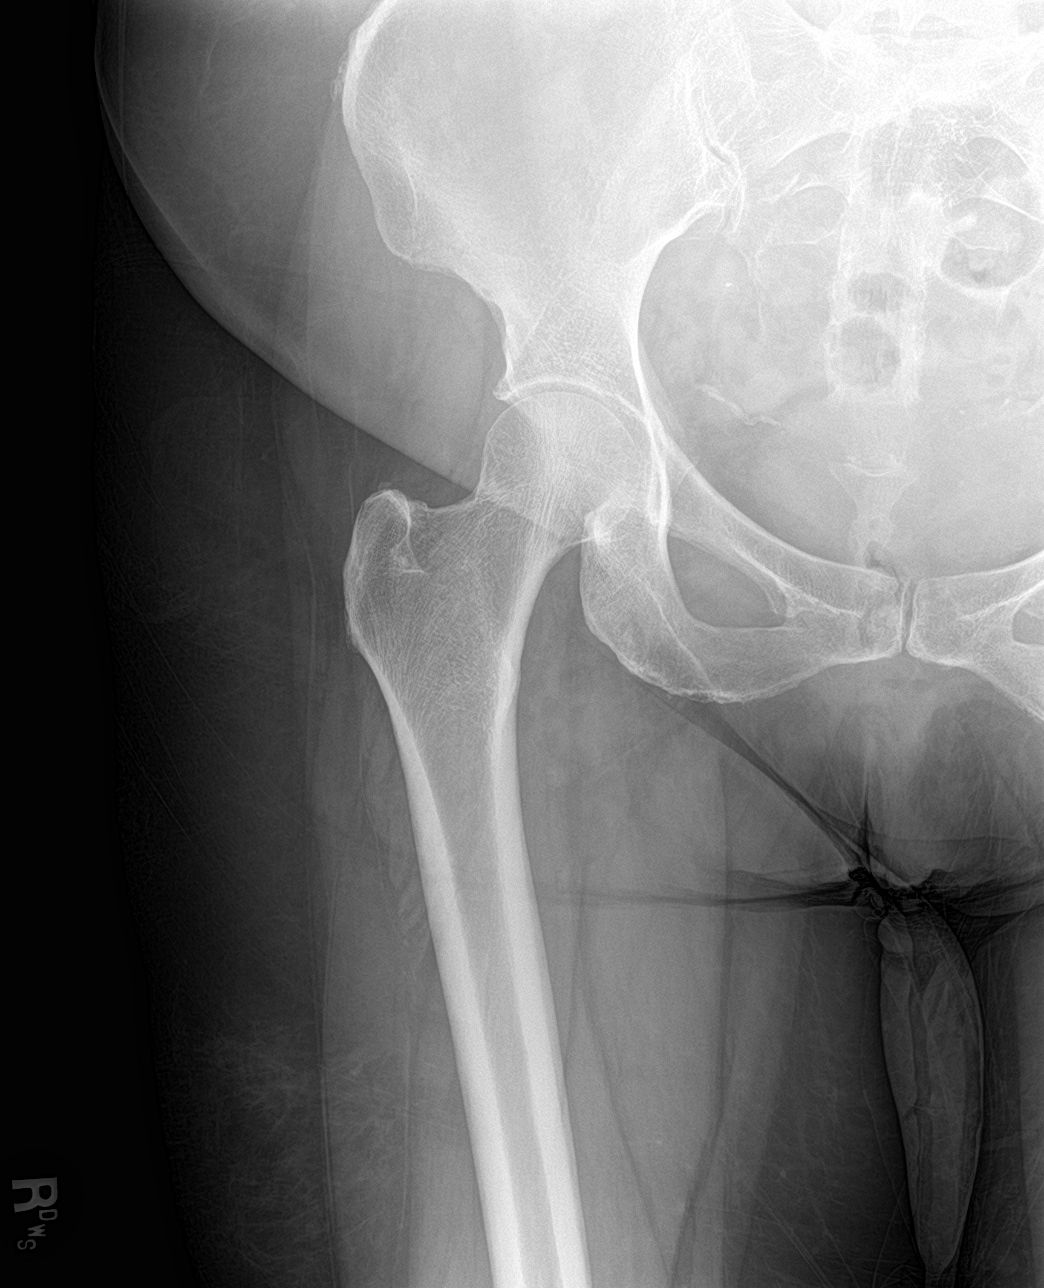

[femur ap (2 of 2)]
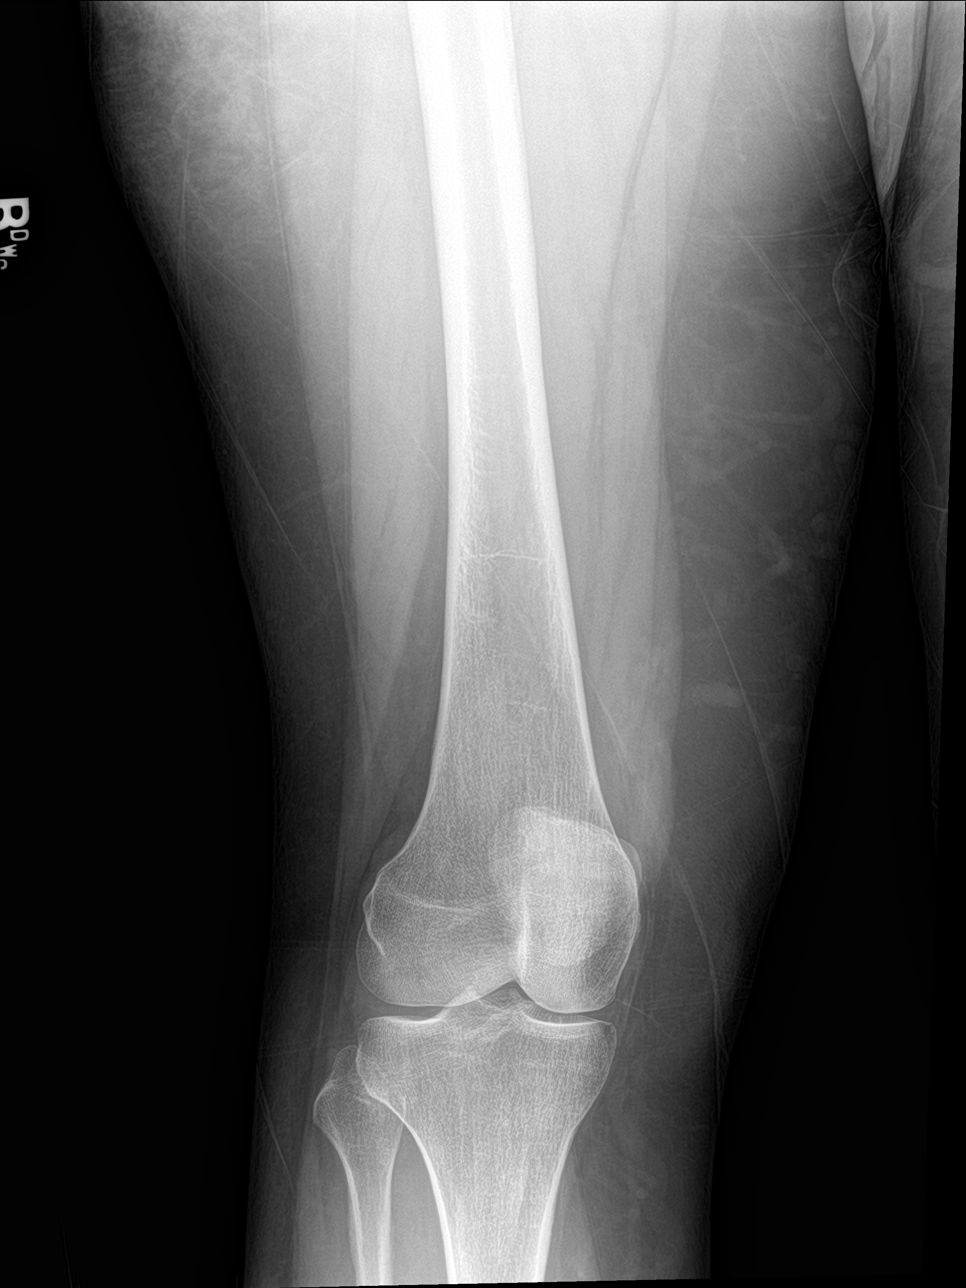

[femur lat (1 of 2)]
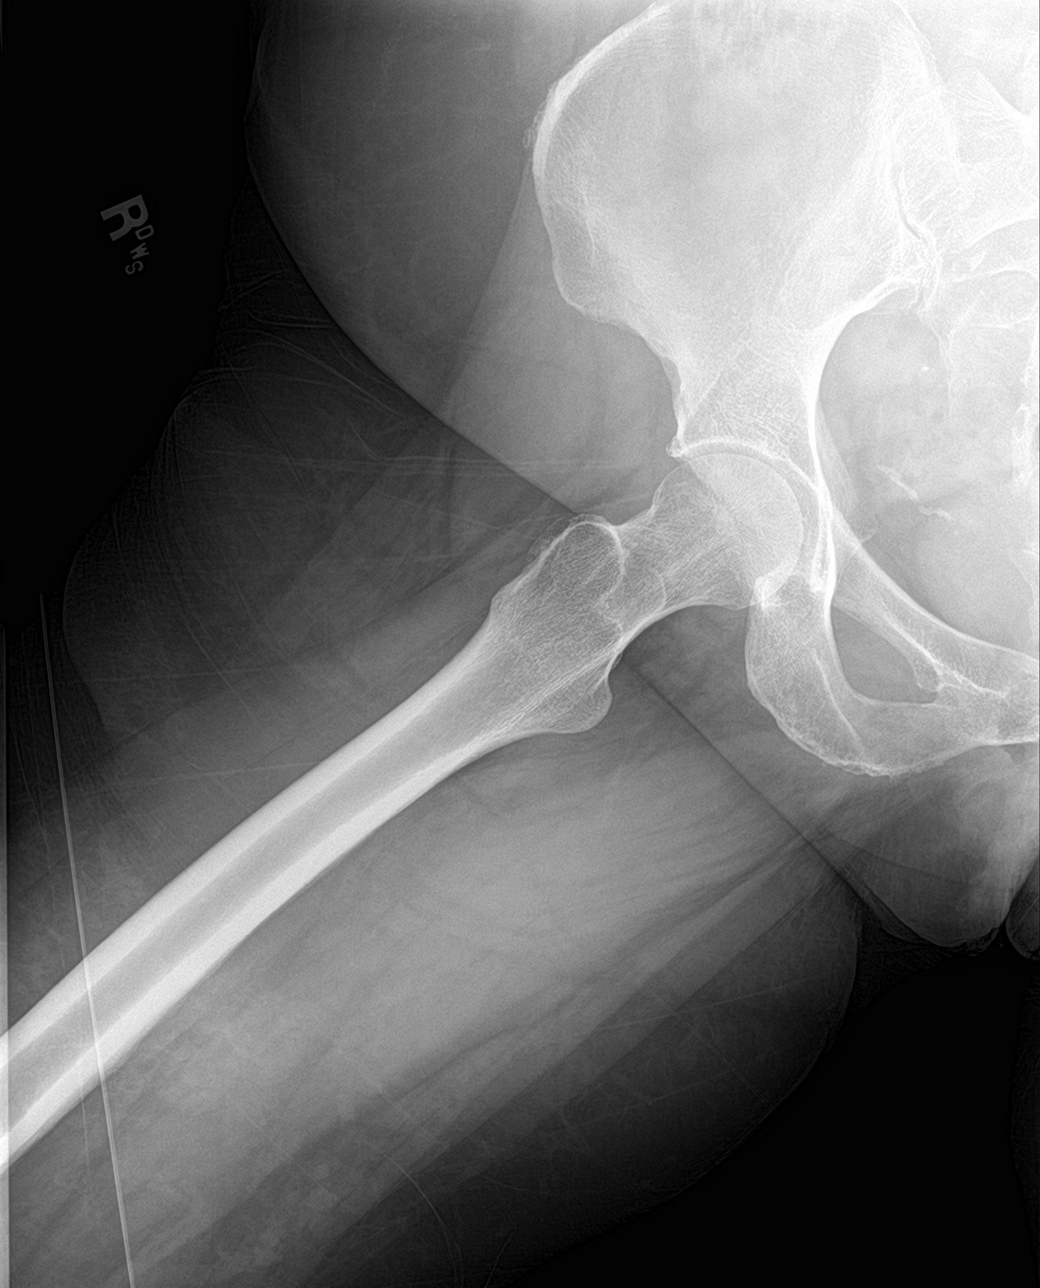

[femur lat (2 of 2)]
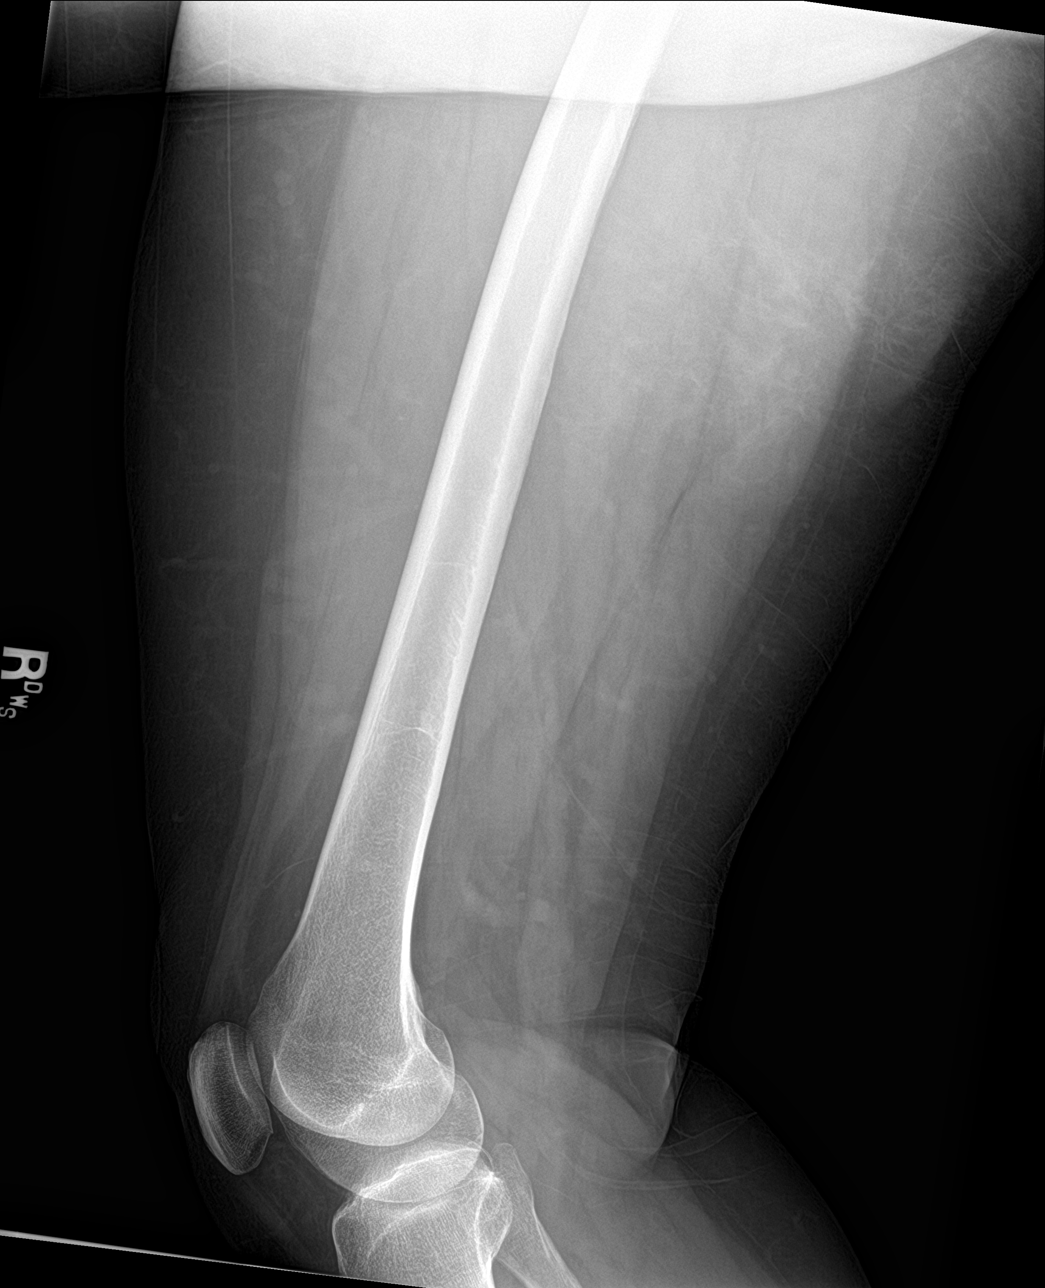

[4 of 4 positions shown; findings below may reference images not displayed]

FINDINGS: There is no evidence of fracture or other focal bone lesions. Soft
tissues are unremarkable. No evidence of radiopaque foreign body.
IMPRESSION: Negative.

## 2022-05-30 DIAGNOSIS — H2513 Age-related nuclear cataract, bilateral: Secondary | ICD-10-CM | POA: Diagnosis not present

## 2022-06-04 ENCOUNTER — Other Ambulatory Visit: Payer: Self-pay | Admitting: Family Medicine

## 2022-06-04 DIAGNOSIS — K219 Gastro-esophageal reflux disease without esophagitis: Secondary | ICD-10-CM

## 2022-06-04 NOTE — Telephone Encounter (Signed)
This patient is due for follow-up. Please get her scheduled. Thanks.

## 2022-06-18 ENCOUNTER — Encounter: Payer: Self-pay | Admitting: Gastroenterology

## 2022-06-18 ENCOUNTER — Other Ambulatory Visit: Payer: Self-pay

## 2022-06-18 ENCOUNTER — Ambulatory Visit: Payer: PPO | Admitting: Gastroenterology

## 2022-06-18 VITALS — BP 192/75 | HR 67 | Temp 98.6°F | Ht 65.0 in | Wt 214.4 lb

## 2022-06-18 DIAGNOSIS — K219 Gastro-esophageal reflux disease without esophagitis: Secondary | ICD-10-CM

## 2022-06-18 DIAGNOSIS — Z1211 Encounter for screening for malignant neoplasm of colon: Secondary | ICD-10-CM

## 2022-06-18 MED ORDER — NA SULFATE-K SULFATE-MG SULF 17.5-3.13-1.6 GM/177ML PO SOLN: 354.0000 mL | Freq: Once | ORAL | 0 refills | Status: AC

## 2022-06-18 MED ORDER — PANTOPRAZOLE SODIUM 20 MG PO TBEC: 20.0000 mg | DELAYED_RELEASE_TABLET | Freq: Every day | ORAL | 3 refills | Status: DC

## 2022-06-18 NOTE — Addendum Note (Signed)
Addended by: Wayna Chalet on: 06/18/2022 03:17 PM   Modules accepted: Orders

## 2022-06-18 NOTE — Addendum Note (Signed)
Addended by: Wayna Chalet on: 06/18/2022 03:47 PM   Modules accepted: Orders

## 2022-06-18 NOTE — Patient Instructions (Addendum)
Please purchase a wedge pillow at [facility] to help you with your acid reflux symptoms.  Food Choices for Gastroesophageal Reflux Disease, Adult When you have gastroesophageal reflux disease (GERD), the foods you eat and your eating habits are very important. Choosing the right foods can help ease your discomfort. Think about working with a food expert (dietitian) to help you make good choices. What are tips for following this plan? Reading food labels Look for foods that are low in saturated fat. Foods that may help with your symptoms include: Foods that have less than 5% of daily value (DV) of fat. Foods that have 0 grams of trans fat. Cooking Do not fry your food. Cook your food by baking, steaming, grilling, or broiling. These are all methods that do not need a lot of fat for cooking. To add flavor, try to use herbs that are low in spice and acidity. Meal planning  Choose healthy foods that are low in fat, such as: Fruits and vegetables. Whole grains. Low-fat dairy products. Lean meats, fish, and poultry. Eat small meals often instead of eating 3 large meals each day. Eat your meals slowly in a place where you are relaxed. Avoid bending over or lying down until 2-3 hours after eating. Limit high-fat foods such as fatty meats or fried foods. Limit your intake of fatty foods, such as oils, butter, and shortening. Avoid the following as told by your doctor: Foods that cause symptoms. These may be different for different people. Keep a food diary to keep track of foods that cause symptoms. Alcohol. Drinking a lot of liquid with meals. Eating meals during the 2-3 hours before bed. Lifestyle Stay at a healthy weight. Ask your doctor what weight is healthy for you. If you need to lose weight, work with your doctor to do so safely. Exercise for at least 30 minutes on 5 or more days each week, or as told by your doctor. Wear loose-fitting clothes. Do not smoke or use any products that  contain nicotine or tobacco. If you need help quitting, ask your doctor. Sleep with the head of your bed higher than your feet. Use a wedge under the mattress or blocks under the bed frame to raise the head of the bed. Chew sugar-free gum after meals. What foods should eat?  Eat a healthy, well-balanced diet of fruits, vegetables, whole grains, low-fat dairy products, lean meats, fish, and poultry. Each person is different. Foods that may cause symptoms in one person may not cause any symptoms in another person. Work with your doctor to find foods that are safe for you. The items listed above may not be a complete list of what you can eat and drink. Contact a food expert for more options. What foods should I avoid? Limiting some of these foods may help in managing the symptoms of GERD. Everyone is different. Talk with a food expert or your doctor to help you find the exact foods to avoid, if any. Fruits Any fruits prepared with added fat. Any fruits that cause symptoms. For some people, this may include citrus fruits, such as oranges, grapefruit, pineapple, and lemons. Vegetables Deep-fried vegetables. [place] fries. Any vegetables prepared with added fat. Any vegetables that cause symptoms. For some people, this may include tomatoes and tomato products, chili peppers, onions and garlic, and horseradish. Grains Pastries or quick breads with added fat. Meats and other proteins High-fat meats, such as fatty beef or pork, hot dogs, ribs, ham, sausage, salami, and bacon. Fried meat or  protein, including fried fish and fried chicken. Nuts and nut butters, in large amounts. Dairy Whole milk and chocolate milk. Sour cream. Cream. Ice cream. Cream cheese. Milkshakes. Fats and oils Butter. Margarine. Shortening. Ghee. Beverages Coffee and tea, with or without caffeine. Carbonated beverages. Sodas. Energy drinks. Fruit juice made with acidic fruits, such as orange or grapefruit. Tomato juice. Alcoholic  drinks. Sweets and desserts Chocolate and cocoa. Donuts. Seasonings and condiments Pepper. Peppermint and spearmint. Added salt. Any condiments, herbs, or seasonings that cause symptoms. For some people, this may include curry, hot sauce, or vinegar-based salad dressings. The items listed above may not be a complete list of what you should not eat and drink. Contact a food expert for more options. Questions to ask your doctor Diet and lifestyle changes are often the first steps that are taken to manage symptoms of GERD. If diet and lifestyle changes do not help, talk with your doctor about taking medicines. Where to find more information International Foundation for Gastrointestinal Disorders: aboutgerd.org Summary When you have GERD, food and lifestyle choices are very important in easing your symptoms. Eat small meals often instead of 3 large meals a day. Eat your meals slowly and in a place where you are relaxed. Avoid bending over or lying down until 2-3 hours after eating. Limit high-fat foods such as fatty meats or fried foods. This information is not intended to replace advice given to you by your health care provider. Make sure you discuss any questions you have with your health care provider. Document Revised: 01/24/2020 Document Reviewed: 01/24/2020 Elsevier Patient Education  [place].

## 2022-06-18 NOTE — Progress Notes (Signed)
Jonathon Bellows MD, MRCP(U.K) [address]  [place]  [place], [place] [address]  Main: [phone]  Fax: [phone]   Gastroenterology Consultation  Referring Provider:     Leone Haven, MD Primary Care Physician:  Leone Haven, MD Primary Gastroenterologist:  Dr. Jonathon Bellows  Reason for Consultation: GERD , Colon Cancer Screening        HPI:   Michele Long is a 71 y.o. y/o female referred for consultation & management  by Dr. Caryl Bis, Angela Adam, MD.     She is she has had acid reflux over 15 years.  She has been taking pantoprazole 40 mg once a day before breakfast in the mornings with that she has very few episodes if at all every month.  She takes multiple over-the-counter health supplements.  Denies any change in her bowel habits.  She knows she is due for colonoscopy.  Occasional use of hydrocodone.   Past Medical History:  Diagnosis Date   Allergy    Depression    DVT (deep venous thrombosis) (HCC)    Edema    Frequent headaches    Gastric ulcer 10/27/2015   GERD (gastroesophageal reflux disease)    HTN (hypertension)    Microalbuminuria    Migraine    Monocular diplopia of right eye 10/22/2016   Shingles    Symptomatic menopausal or female climacteric states    UTI (lower urinary tract infection)     Past Surgical History:  Procedure Laterality Date   AUGMENTATION MAMMAPLASTY Bilateral [place]   NASAL SEPTUM SURGERY  2000    Prior to Admission medications   Medication Sig Start Date End Date Taking? Authorizing Provider  benzonatate (TESSALON) 100 MG capsule Take 1 capsule (100 mg total) by mouth 3 (three) times daily as needed for cough. 02/26/22   Burnard Hawthorne, FNP  buPROPion (WELLBUTRIN XL) 150 MG 24 hr tablet TAKE 2 TABLETS BY MOUTH DAILY 06/04/22   Leone Haven, MD  citalopram (CELEXA) 40 MG tablet TAKE 1 TABLET BY MOUTH EVERY DAY 01/22/22   Leone Haven, MD  furosemide (LASIX) 20 MG tablet  TAKE 1 TABLET BY MOUTH EVERY DAY Patient taking differently: Take 20 mg by mouth as needed. 01/31/22   Leone Haven, MD  HYDROcodone-acetaminophen (NORCO) 10-325 MG tablet Take 0.5 tablets by mouth every 6 (six) hours as needed. 11/16/21   Duanne Guess, PA-C  hydrOXYzine (ATARAX) 10 MG tablet TAKE 1 TABLET (10 MG TOTAL) BY MOUTH 3 (THREE) TIMES DAILY AS NEEDED FOR ANXIETY. Patient taking differently: Take 10 mg by mouth 2 (two) times daily as needed for anxiety. 12/28/21   Leone Haven, MD  meloxicam (MOBIC) 15 MG tablet Take 15 mg by mouth daily. 12/13/21   [provider]  methocarbamol (ROBAXIN) 500 MG tablet Take 500 mg by mouth 3 (three) times daily. 12/27/21   [provider]  ondansetron (ZOFRAN-ODT) 4 MG disintegrating tablet Take 1 tablet (4 mg total) by mouth every 8 (eight) hours as needed. 06/02/21   Fisher, Linden Dolin, PA-C  pantoprazole (PROTONIX) 40 MG tablet Take 1 tablet (40 mg total) by mouth daily. 06/04/22 09/02/22  Leone Haven, MD  traMADol (ULTRAM) 50 MG tablet Take 1 tablet (50 mg total) by mouth every 6 (six) hours as needed. 06/02/21   Versie Starks, PA-C  Vitamin D, Ergocalciferol, (DRISDOL) 1.25 MG (50000 UNIT) CAPS capsule Take 1 capsule (50,000 Units total)  by mouth every 7 (seven) days. 02/07/22   Kennyth Arnold, FNP    Family History  Problem Relation Age of Onset   Arthritis Mother    Hypertension Mother    Hypertension Father    Osteoarthritis Father    Migraines Father    Cancer Maternal Grandmother    Diabetes Maternal Grandfather    Heart disease Maternal Grandfather    Arthritis Paternal Grandmother    Cancer Paternal Grandfather        Prostate   Pancreatic cancer Paternal Grandfather    Diabetes Brother      Social History   Tobacco Use   Smoking status: Never   Smokeless tobacco: Never  Vaping Use   Vaping Use: Never used  Substance Use Topics   Alcohol use: No    Alcohol/week: 0.0 standard drinks of alcohol    Drug use: No    Allergies as of 06/18/2022 - Review Complete 06/18/2022  Allergen Reaction Noted   Erythromycin Nausea And Vomiting 10/23/2015   Misc. sulfonamide containing compounds Other (See Comments) 06/17/2022   Sulfa antibiotics Nausea And Vomiting and Nausea Only 10/03/2016    Review of Systems:    All systems reviewed and negative except where noted in HPI.   Physical Exam:  BP (!) 192/75   Pulse 67   Temp 98.6 F (37 C) (Oral)   Ht '5\' 5"'$  (1.651 m)   Wt 214 lb 6.4 oz (97.3 kg)   BMI 35.68 kg/m  No LMP recorded. Patient is postmenopausal. Psych:  Alert and cooperative. Normal mood and affect. General:   Alert,  Well-developed, well-nourished, pleasant and cooperative in NAD Head:  Normocephalic and atraumatic. Eyes:  Sclera clear, no icterus.   Conjunctiva pink. Ears:  Normal auditory acuity. Neurologic:  Alert and oriented x3;  grossly normal neurologically. Psych:  Alert and cooperative. Normal mood and affect.  Imaging Studies: No results found.  Assessment and Plan:   Michele Long is a 71 y.o. y/o female has been referred for GERD and colon cancer screening.  Previous history of colon polyps in 2013 unknown histology.  Plan 1.  Colonoscopy for colon cancer screening and EGD to screen for Barrett's esophagus 2. GERD : Counseled on life style changes, suggest to use PPI first thing in the morning on empty stomach and eat 30 minutes after. Advised on the use of a wedge pillow at night , avoid meals for 2 hours prior to bed time. Weight loss .Discussed the risks and benefits of long term PPI use including but not limited to bone loss, chronic kidney disease, infections , low magnesium . Aim to use at the lowest dose for the shortest period of time.  Patient information provided we will decrease the dose of Protonix to 20 mg once a day at the next visit if she is doing well we will change her to famotidine.  Advised to use a wedge pillow picture of it will be  provided   I have discussed alternative options, risks & benefits,  which include, but are not limited to, bleeding, infection, perforation,respiratory complication & drug reaction.  The patient agrees with this plan & written consent will be obtained.       Follow up in 6 months GERD  Dr Jonathon Bellows MD,MRCP(U.K)

## 2022-07-05 ENCOUNTER — Encounter: Payer: Self-pay | Admitting: Gastroenterology

## 2022-07-08 ENCOUNTER — Encounter: Payer: Self-pay | Admitting: Gastroenterology

## 2022-07-08 ENCOUNTER — Ambulatory Visit: Payer: PPO | Admitting: Anesthesiology

## 2022-07-08 ENCOUNTER — Encounter
Admission: RE | Disposition: A | Discharge status: Home / Self Care | Payer: Self-pay | Source: Home / Self Care | Attending: Gastroenterology

## 2022-07-08 ENCOUNTER — Ambulatory Visit
Admission: RE | Admit: 2022-07-08 | Discharge: 2022-07-08 | Disposition: A | Discharge status: Home / Self Care | Payer: PPO | Attending: Gastroenterology | Admitting: Gastroenterology

## 2022-07-08 DIAGNOSIS — D120 Benign neoplasm of cecum: Secondary | ICD-10-CM | POA: Diagnosis not present

## 2022-07-08 DIAGNOSIS — Z13810 Encounter for screening for upper gastrointestinal disorder: Secondary | ICD-10-CM | POA: Diagnosis not present

## 2022-07-08 DIAGNOSIS — I10 Essential (primary) hypertension: Secondary | ICD-10-CM | POA: Insufficient documentation

## 2022-07-08 DIAGNOSIS — Z86718 Personal history of other venous thrombosis and embolism: Secondary | ICD-10-CM | POA: Diagnosis not present

## 2022-07-08 DIAGNOSIS — D124 Benign neoplasm of descending colon: Secondary | ICD-10-CM | POA: Diagnosis not present

## 2022-07-08 DIAGNOSIS — Z86010 Personal history of colonic polyps: Secondary | ICD-10-CM | POA: Insufficient documentation

## 2022-07-08 DIAGNOSIS — K649 Unspecified hemorrhoids: Secondary | ICD-10-CM | POA: Diagnosis not present

## 2022-07-08 DIAGNOSIS — F32A Depression, unspecified: Secondary | ICD-10-CM | POA: Diagnosis not present

## 2022-07-08 DIAGNOSIS — K219 Gastro-esophageal reflux disease without esophagitis: Secondary | ICD-10-CM | POA: Diagnosis not present

## 2022-07-08 DIAGNOSIS — K635 Polyp of colon: Secondary | ICD-10-CM | POA: Diagnosis not present

## 2022-07-08 DIAGNOSIS — D122 Benign neoplasm of ascending colon: Secondary | ICD-10-CM | POA: Insufficient documentation

## 2022-07-08 DIAGNOSIS — D125 Benign neoplasm of sigmoid colon: Secondary | ICD-10-CM | POA: Diagnosis not present

## 2022-07-08 DIAGNOSIS — Z1211 Encounter for screening for malignant neoplasm of colon: Secondary | ICD-10-CM | POA: Diagnosis not present

## 2022-07-08 DIAGNOSIS — Z8711 Personal history of peptic ulcer disease: Secondary | ICD-10-CM | POA: Diagnosis not present

## 2022-07-08 HISTORY — PX: ESOPHAGOGASTRODUODENOSCOPY: SHX5428

## 2022-07-08 HISTORY — PX: COLONOSCOPY WITH PROPOFOL: SHX5780

## 2022-07-08 HISTORY — DX: Unspecified cataract: H26.9

## 2022-07-08 SURGERY — COLONOSCOPY WITH PROPOFOL
Anesthesia: General

## 2022-07-08 MED ORDER — PROPOFOL 500 MG/50ML IV EMUL
INTRAVENOUS | Status: DC | PRN
  Administered 2022-07-08: 140 ug/kg/min via INTRAVENOUS

## 2022-07-08 MED ORDER — PROPOFOL 10 MG/ML IV BOLUS
INTRAVENOUS | Status: DC | PRN
  Administered 2022-07-08: 70 mg via INTRAVENOUS

## 2022-07-08 MED ORDER — LIDOCAINE HCL (CARDIAC) PF 100 MG/5ML IV SOSY
PREFILLED_SYRINGE | INTRAVENOUS | Status: DC | PRN
  Administered 2022-07-08: 60 mg via INTRAVENOUS

## 2022-07-08 MED ORDER — DEXMEDETOMIDINE HCL IN NACL 80 MCG/20ML IV SOLN
INTRAVENOUS | Status: DC | PRN
  Administered 2022-07-08: 12 ug via INTRAVENOUS

## 2022-07-08 MED ORDER — SIMETHICONE 40 MG/0.6ML PO SUSP
ORAL | Status: DC | PRN
  Administered 2022-07-08: 60 mL

## 2022-07-08 MED ORDER — SODIUM CHLORIDE 0.9 % IV SOLN: INTRAVENOUS | Status: DC

## 2022-07-08 NOTE — Anesthesia Postprocedure Evaluation (Signed)
Anesthesia Post Note  Patient: Michele Long  Procedure(s) Performed: COLONOSCOPY WITH PROPOFOL ESOPHAGOGASTRODUODENOSCOPY (EGD)  Patient location during evaluation: Endoscopy Anesthesia Type: General Level of consciousness: awake and alert Pain management: pain level controlled Vital Signs Assessment: post-procedure vital signs reviewed and stable Respiratory status: spontaneous breathing, nonlabored ventilation, respiratory function stable and patient connected to nasal cannula oxygen Cardiovascular status: blood pressure returned to baseline and stable Postop Assessment: no apparent nausea or vomiting Anesthetic complications: no   No notable events documented.   Last Vitals:  Vitals:   07/08/22 1105 07/08/22 1110  BP: 113/65   Pulse:    Resp:    Temp:    SpO2: 96% 96%    Last Pain:  Vitals:   07/08/22 1110  TempSrc:   PainSc: 0-No pain                 Precious Haws Conley [place]

## 2022-07-08 NOTE — Op Note (Signed)
[facility] Gastroenterology Patient Name: Michele Long Procedure Date: 07/08/2022 10:03 AM MRN: [id number] Account #: [id number] Date of Birth: July 11, 1951 Admit Type: Outpatient Age: 71 Room: [facility] ENDO ROOM 1 Gender: Female Note Status: Finalized Instrument Name: Jasper Riling [id number] Procedure:             Colonoscopy Indications:           Surveillance: Personal history of colonic polyps                         (unknown histology) on last colonoscopy more than 5                         years ago Providers:             Jonathon Bellows MD, MD Referring MD:          No Local Md, MD (Referring MD) Medicines:             Propofol per Anesthesia, Monitored Anesthesia Care Complications:         No immediate complications. Procedure:             Pre-Anesthesia Assessment:                        - Prior to the procedure, a History and Physical was                         performed, and patient medications, allergies and                         sensitivities were reviewed. The patient's tolerance                         of previous anesthesia was reviewed.                        - The risks and benefits of the procedure and the                         sedation options and risks were discussed with the                         patient. All questions were answered and informed                         consent was obtained.                        - After reviewing the risks and benefits, the patient                         was deemed in satisfactory condition to undergo the                         procedure.                        - ASA Grade Assessment: II - A patient with mild  systemic disease.                        After obtaining informed consent, the colonoscope was                         passed under direct vision. Throughout the procedure,                         the patient's blood pressure, pulse, and oxygen                         saturations  were monitored continuously. The                         Colonoscope was introduced through the anus and                         advanced to the the cecum, identified by the                         appendiceal orifice. The colonoscopy was performed                         with ease. The patient tolerated the procedure well.                         The quality of the bowel preparation was excellent.                         The ileocecal valve, appendiceal orifice, and rectum                         were photographed. Findings:      The perianal and digital rectal examinations were normal.      A 5 mm polyp was found in the cecum. The polyp was sessile. The polyp       was removed with a jumbo cold forceps. Resection and retrieval were       complete.      A 3 mm polyp was found in the ascending colon. The polyp was sessile.       The polyp was removed with a jumbo cold forceps. Resection and retrieval       were complete.      Three sessile polyps were found in the descending colon. The polyps were       7 to 9 mm in size. These polyps were removed with a cold snare.       Resection and retrieval were complete. To prevent bleeding       post-intervention, one hemostatic clip was successfully placed. There       was no bleeding at the end of the procedure.      The exam was otherwise without abnormality on direct and retroflexion       views. Impression:            - One 5 mm polyp in the cecum, removed with a jumbo                         cold forceps. Resected and retrieved.                        -  One 3 mm polyp in the ascending colon, removed with                         a jumbo cold forceps. Resected and retrieved.                        - Three 7 to 9 mm polyps in the descending colon,                         removed with a cold snare. Resected and retrieved.                         Clip was placed.                        - The examination was otherwise normal on direct and                          retroflexion views. Recommendation:        - Discharge patient to home (with escort).                        - Resume previous diet.                        - Continue present medications.                        - Await pathology results.                        - Repeat colonoscopy for surveillance based on                         pathology results. Procedure Code(s):     --- Professional ---                        [phone], Colonoscopy, flexible; with removal of                         tumor(s), polyp(s), or other lesion(s) by snare                         technique                        [id number], 57, Colonoscopy, flexible; with biopsy, single                         or multiple Diagnosis Code(s):     --- Professional ---                        Z86.010, Personal history of colonic polyps                        D12.0, Benign neoplasm of cecum                        D12.2, Benign neoplasm of ascending colon  D12.4, Benign neoplasm of descending colon CPT copyright 2022 American Medical Association. All rights reserved. The codes documented in this report are preliminary and upon coder review may  be revised to meet current compliance requirements. Jonathon Bellows, MD Jonathon Bellows MD, MD 07/08/2022 10:36:32 AM This report has been signed electronically. Number of Addenda: 0 Note Initiated On: 07/08/2022 10:03 AM Scope Withdrawal Time: 0 hours 11 minutes 33 seconds  Total Procedure Duration: 0 hours 16 minutes 19 seconds  Estimated Blood Loss:  Estimated blood loss: none.      [facility]

## 2022-07-08 NOTE — Anesthesia Preprocedure Evaluation (Signed)
Anesthesia Evaluation  Patient identified by MRN, date of birth, ID band Patient awake    Reviewed: Allergy & Precautions, NPO status , Patient's Chart, lab work & pertinent test results  History of Anesthesia Complications Negative for: history of anesthetic complications  Airway Mallampati: III  TM Distance: <3 FB Neck ROM: full    Dental  (+) Chipped   Pulmonary neg pulmonary ROS, neg shortness of breath   Pulmonary exam normal        Cardiovascular Exercise Tolerance: Good hypertension, (-) angina (-) Past MI and (-) DOE Normal cardiovascular exam     Neuro/Psych  Headaches PSYCHIATRIC DISORDERS         GI/Hepatic Neg liver ROS, PUD,GERD  Controlled,,  Endo/Other  negative endocrine ROS    Renal/GU negative Renal ROS  negative genitourinary   Musculoskeletal   Abdominal   Peds  Hematology negative hematology ROS (+)   Anesthesia Other Findings Past Medical History: No date: Allergy No date: Cataracts, bilateral No date: Depression No date: DVT (deep venous thrombosis) (HCC) No date: Edema No date: Frequent headaches 10/27/2015: Gastric ulcer No date: GERD (gastroesophageal reflux disease) No date: HTN (hypertension) No date: Microalbuminuria No date: Migraine 10/22/2016: Monocular diplopia of right eye No date: Shingles No date: Symptomatic menopausal or female climacteric states No date: UTI (lower urinary tract infection)  Past Surgical History: 1983: AUGMENTATION MAMMAPLASTY; Bilateral 1983: BREAST ENHANCEMENT SURGERY 2000: NASAL SEPTUM SURGERY  BMI    Body Mass Index: 36.61 kg/m      Reproductive/Obstetrics negative OB ROS                             Anesthesia Physical Anesthesia Plan  ASA: 3  Anesthesia Plan: General   Post-op Pain Management:    Induction: Intravenous  PONV Risk Score and Plan: Propofol infusion and TIVA  Airway Management Planned:  Natural Airway and Nasal Cannula  Additional Equipment:   Intra-op Plan:   Post-operative Plan:   Informed Consent: I have reviewed the patients History and Physical, chart, labs and discussed the procedure including the risks, benefits and alternatives for the proposed anesthesia with the patient or authorized representative who has indicated his/her understanding and acceptance.     Dental Advisory Given  Plan Discussed with: Anesthesiologist, CRNA and Surgeon  Anesthesia Plan Comments: (Patient consented for risks of anesthesia including but not limited to:  - adverse reactions to medications - risk of airway placement if required - damage to eyes, teeth, lips or other oral mucosa - nerve damage due to positioning  - sore throat or hoarseness - Damage to heart, brain, nerves, lungs, other parts of body or loss of life  Patient voiced understanding.)       Anesthesia Quick Evaluation

## 2022-07-08 NOTE — H&P (Signed)
Jonathon Bellows, MD [address] [place], [place], [place], [address] [address], [place], [place], [place], [address] Phone: [phone]  Fax: [phone]  Primary Care Physician:  Leone Haven, MD   Pre-Procedure History & Physical: HPI:  Michele Long is a 71 y.o. female is here for an endoscopy and colonoscopy    Past Medical History:  Diagnosis Date   Allergy    Cataracts, bilateral    Depression    DVT (deep venous thrombosis) (HCC)    Edema    Frequent headaches    Gastric ulcer 10/27/2015   GERD (gastroesophageal reflux disease)    HTN (hypertension)    Microalbuminuria    Migraine    Monocular diplopia of right eye 10/22/2016   Shingles    Symptomatic menopausal or female climacteric states    UTI (lower urinary tract infection)     Past Surgical History:  Procedure Laterality Date   AUGMENTATION MAMMAPLASTY Bilateral [place]    Prior to Admission medications   Medication Sig Start Date End Date Taking? Authorizing Provider  buPROPion (WELLBUTRIN XL) 150 MG 24 hr tablet TAKE 2 TABLETS BY MOUTH DAILY 06/04/22  Yes Leone Haven, MD  citalopram (CELEXA) 40 MG tablet TAKE 1 TABLET BY MOUTH EVERY DAY 01/22/22  Yes Leone Haven, MD  pantoprazole (PROTONIX) 20 MG tablet Take 1 tablet (20 mg total) by mouth daily. 06/18/22  Yes Jonathon Bellows, MD  benzonatate (TESSALON) 100 MG capsule Take 1 capsule (100 mg total) by mouth 3 (three) times daily as needed for cough. Patient not taking: Reported on 06/18/2022 02/26/22   Burnard Hawthorne, FNP  [facility] Liver Oil CAPS Take 1 capsule by mouth daily.    [provider]  furosemide (LASIX) 20 MG tablet TAKE 1 TABLET BY MOUTH EVERY DAY Patient taking differently: Take 20 mg by mouth as needed. 01/31/22   Leone Haven, MD  HYDROcodone-acetaminophen (NORCO) 10-325 MG tablet Take 0.5 tablets by mouth every 6 (six) hours as needed. 11/16/21    Duanne Guess, PA-C  hydrOXYzine (ATARAX) 10 MG tablet TAKE 1 TABLET (10 MG TOTAL) BY MOUTH 3 (THREE) TIMES DAILY AS NEEDED FOR ANXIETY. Patient taking differently: Take 10 mg by mouth 2 (two) times daily as needed for anxiety. 12/28/21   Leone Haven, MD  Magnesium Citrate 100 MG CAPS Take 1 capsule by mouth daily.    [provider]  meloxicam (MOBIC) 15 MG tablet Take 15 mg by mouth daily. 12/13/21   [provider]  methocarbamol (ROBAXIN) 500 MG tablet Take 500 mg by mouth 3 (three) times daily. 12/27/21   [provider]  ondansetron (ZOFRAN-ODT) 4 MG disintegrating tablet Take 1 tablet (4 mg total) by mouth every 8 (eight) hours as needed. 06/02/21   Fisher, Linden Dolin, PA-C  OVER THE COUNTER MEDICATION Take 1 capsule by mouth daily. Recuperate IQ    [provider]  OVER THE COUNTER MEDICATION Take 1 capsule by mouth daily. Gut support    [provider]  Probiotic Product (SUPER PROBIOTIC DIGESTIVE) CAPS Take 1 capsule by mouth daily.    [provider]  Alroy Bailiff OIL 1 drop by Does not apply route daily.    [provider]  traMADol (ULTRAM) 50 MG tablet Take 1 tablet (50 mg total) by mouth every 6 (six) hours as needed. 06/02/21   Ashok Cordia  W, PA-C  Vitamin D, Ergocalciferol, (DRISDOL) 1.25 MG (50000 UNIT) CAPS capsule Take 1 capsule (50,000 Units total) by mouth every 7 (seven) days. 02/07/22   Dutch Quint B, FNP    Allergies as of 06/19/2022 - Review Complete 06/18/2022  Allergen Reaction Noted   Erythromycin Nausea And Vomiting 10/23/2015   Misc. sulfonamide containing compounds Other (See Comments) 06/17/2022   Sulfa antibiotics Nausea And Vomiting and Nausea Only 10/03/2016    Family History  Problem Relation Age of Onset   Arthritis Mother    Hypertension Mother    Hypertension Father    Osteoarthritis Father    Migraines Father    Cancer Maternal Grandmother    Diabetes Maternal Grandfather     Heart disease Maternal Grandfather    Arthritis Paternal Grandmother    Cancer Paternal Grandfather        Prostate   Pancreatic cancer Paternal Grandfather    Diabetes Brother     Social History   Socioeconomic History   Marital status: Widowed    Spouse name: Not on file   Number of children: Not on file   Years of education: Not on file   Highest education level: Not on file  Occupational History   Not on file  Tobacco Use   Smoking status: Never   Smokeless tobacco: Never  Vaping Use   Vaping Use: Never used  Substance and Sexual Activity   Alcohol use: No    Alcohol/week: 0.0 standard drinks of alcohol   Drug use: No   Sexual activity: Never  Other Topics Concern   Not on file  Social History Narrative   2 Children (1 daughter 31) and 1 son passed away at 7 months    2 dogs    Caffeine 1 cup coffee   Widower    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    Social Determinants of Health   Financial Resource Strain: [place]  (03/27/2022)   Overall Financial Resource Strain (CARDIA)    Difficulty of Paying Living Expenses: Not hard at all  Food Insecurity: No Food Insecurity (03/27/2022)   Hunger Vital Sign    Worried About Running Out of Food in the Last Year: Never true    [place] in the Last Year: Never true  Transportation Needs: No Transportation Needs (03/27/2022)   PRAPARE - Hydrologist (Medical): No    Lack of Transportation (Non-Medical): No  Physical Activity: Not on file  Stress: No Stress Concern Present (03/27/2022)   Long    Feeling of Stress : Only a little  Social Connections: Unknown (03/27/2022)   Social Connection and Isolation Panel [NHANES]    Frequency of Communication with Friends and Family: Twice a week    Frequency of Social Gatherings with Friends and Family: More than three times a week    Attends Religious Services: Not on Stage manager or Organizations: Not on file    Attends Archivist Meetings: Not on file    Marital Status: Married  Intimate Partner Violence: Not At Risk (03/27/2022)   Humiliation, Afraid, Rape, and Kick questionnaire    Fear of Current or Ex-Partner: No    Emotionally Abused: No    Physically Abused: No    Sexually Abused: No    Review of Systems: See HPI, otherwise negative ROS  Physical Exam: BP (!) 85/54  Pulse 77   Temp 97.9 F (36.6 C) (Temporal)   Resp 18   Ht '5\' 5"'$  (1.651 m)   Wt 99.8 kg   SpO2 99%   BMI 36.61 kg/m  General:   Alert,  pleasant and cooperative in NAD Head:  Normocephalic and atraumatic. Neck:  Supple; no masses or thyromegaly. Lungs:  Clear throughout to auscultation, normal respiratory effort.    Heart:  +S1, +S2, Regular rate and rhythm, No edema. Abdomen:  Soft, nontender and nondistended. Normal bowel sounds, without guarding, and without rebound.   Neurologic:  Alert and  oriented x4;  grossly normal neurologically.  Impression/Plan: Michele Long is here for an endoscopy and colonoscopy  to be performed for  evaluation of GERD, barrettes screening and surveillance due to proor history of colon polyps    Risks, benefits, limitations, and alternatives regarding endoscopy have been reviewed with the patient.  Questions have been answered.  All parties agreeable.   Jonathon Bellows, MD  07/08/2022, 10:42 AM

## 2022-07-08 NOTE — Transfer of Care (Signed)
Immediate Anesthesia Transfer of Care Note  Patient: Michele Long  Procedure(s) Performed: COLONOSCOPY WITH PROPOFOL ESOPHAGOGASTRODUODENOSCOPY (EGD)  Patient Location: PACU  Anesthesia Type:General  Level of Consciousness: awake, alert , and oriented  Airway & Oxygen Therapy: Patient Spontanous Breathing  Post-op Assessment: Report given to RN and Post -op Vital signs reviewed and stable  Post vital signs: Reviewed and stable  Last Vitals:  Vitals Value Taken Time  BP 85/54 07/08/22 1039  Temp    Pulse 65 07/08/22 1040  Resp 13 07/08/22 1040  SpO2 94 % 07/08/22 1040  Vitals shown include unvalidated device data.  Last Pain:  Vitals:   07/08/22 1039  TempSrc:   PainSc: 0-No pain         Complications: No notable events documented.

## 2022-07-08 NOTE — Op Note (Signed)
[facility] Gastroenterology Patient Name: Michele Long Procedure Date: 07/08/2022 10:03 AM MRN: [id number] Account #: [id number] Date of Birth: 1950/08/17 Admit Type: Outpatient Age: 71 Room: [facility] ENDO ROOM 1 Gender: Female Note Status: Finalized Instrument Name: Upper Endoscope [id number] Procedure:             Upper GI endoscopy Indications:           Screening for Barrett's esophagus Providers:             Jonathon Bellows MD, MD Referring MD:          No Local Md, MD (Referring MD) Medicines:             Monitored Anesthesia Care Complications:         No immediate complications. Procedure:             Pre-Anesthesia Assessment:                        - Prior to the procedure, a History and Physical was                         performed, and patient medications, allergies and                         sensitivities were reviewed. The patient's tolerance                         of previous anesthesia was reviewed.                        - The risks and benefits of the procedure and the                         sedation options and risks were discussed with the                         patient. All questions were answered and informed                         consent was obtained.                        - ASA Grade Assessment: II - A patient with mild                         systemic disease.                        After obtaining informed consent, the endoscope was                         passed under direct vision. Throughout the procedure,                         the patient's blood pressure, pulse, and oxygen                         saturations were monitored continuously. The  Endosonoscope was introduced through the mouth, and                         advanced to the third part of duodenum. The upper GI                         endoscopy was accomplished with ease. The patient                         tolerated the procedure well. Findings:       The esophagus was normal.      The stomach was normal.      The examined duodenum was normal. Impression:            - Normal esophagus.                        - Normal stomach.                        - Normal examined duodenum.                        - No specimens collected. Recommendation:        - Perform a colonoscopy today. Procedure Code(s):     --- Professional ---                        [phone], Esophagogastroduodenoscopy, flexible,                         transoral; diagnostic, including collection of                         specimen(s) by brushing or washing, when performed                         (separate procedure) Diagnosis Code(s):     --- Professional ---                        Z13.810, Encounter for screening for upper                         gastrointestinal disorder CPT copyright 2022 American Medical Association. All rights reserved. The codes documented in this report are preliminary and upon coder review may  be revised to meet current compliance requirements. Jonathon Bellows, MD Jonathon Bellows MD, MD 07/08/2022 10:15:43 AM This report has been signed electronically. Number of Addenda: 0 Note Initiated On: 07/08/2022 10:03 AM Estimated Blood Loss:  Estimated blood loss: none.      [facility]

## 2022-07-09 ENCOUNTER — Encounter: Payer: Self-pay | Admitting: Gastroenterology

## 2022-07-09 LAB — SURGICAL PATHOLOGY

## 2022-07-15 ENCOUNTER — Encounter: Payer: Self-pay | Admitting: Gastroenterology

## 2022-08-12 DIAGNOSIS — H2511 Age-related nuclear cataract, right eye: Secondary | ICD-10-CM | POA: Diagnosis not present

## 2022-09-11 ENCOUNTER — Encounter: Payer: Self-pay | Admitting: Ophthalmology

## 2022-09-12 NOTE — Discharge Instructions (Signed)

## 2022-09-16 ENCOUNTER — Ambulatory Visit: Payer: PPO | Admitting: Anesthesiology

## 2022-09-16 ENCOUNTER — Ambulatory Visit
Admission: RE | Admit: 2022-09-16 | Discharge: 2022-09-16 | Disposition: A | Discharge status: Home / Self Care | Payer: PPO | Attending: Ophthalmology | Admitting: Ophthalmology

## 2022-09-16 ENCOUNTER — Encounter
Admission: RE | Disposition: A | Discharge status: Home / Self Care | Payer: Self-pay | Source: Home / Self Care | Attending: Ophthalmology

## 2022-09-16 ENCOUNTER — Other Ambulatory Visit: Payer: Self-pay

## 2022-09-16 DIAGNOSIS — Z87891 Personal history of nicotine dependence: Secondary | ICD-10-CM | POA: Diagnosis not present

## 2022-09-16 DIAGNOSIS — Z6836 Body mass index (BMI) 36.0-36.9, adult: Secondary | ICD-10-CM | POA: Diagnosis not present

## 2022-09-16 DIAGNOSIS — K219 Gastro-esophageal reflux disease without esophagitis: Secondary | ICD-10-CM | POA: Insufficient documentation

## 2022-09-16 DIAGNOSIS — E669 Obesity, unspecified: Secondary | ICD-10-CM | POA: Diagnosis not present

## 2022-09-16 DIAGNOSIS — I10 Essential (primary) hypertension: Secondary | ICD-10-CM | POA: Diagnosis not present

## 2022-09-16 DIAGNOSIS — H2511 Age-related nuclear cataract, right eye: Secondary | ICD-10-CM | POA: Insufficient documentation

## 2022-09-16 HISTORY — DX: Presence of external hearing-aid: Z97.4

## 2022-09-16 HISTORY — PX: CATARACT EXTRACTION W/PHACO: SHX586

## 2022-09-16 SURGERY — PHACOEMULSIFICATION, CATARACT, WITH IOL INSERTION
Anesthesia: Monitor Anesthesia Care | Site: Eye | Laterality: Right

## 2022-09-16 MED ORDER — SIGHTPATH DOSE#1 BSS IO SOLN
INTRAOCULAR | Status: DC | PRN
  Administered 2022-09-16: 85 mL via OPHTHALMIC

## 2022-09-16 MED ORDER — MOXIFLOXACIN HCL 0.5 % OP SOLN
OPHTHALMIC | Status: DC | PRN
  Administered 2022-09-16: .2 mL via OPHTHALMIC

## 2022-09-16 MED ORDER — SIGHTPATH DOSE#1 BSS IO SOLN
INTRAOCULAR | Status: DC | PRN
  Administered 2022-09-16: 15 mL

## 2022-09-16 MED ORDER — TETRACAINE HCL 0.5 % OP SOLN
1.0000 [drp] | OPHTHALMIC | Status: DC | PRN
  Administered 2022-09-16 (×3): 1 [drp] via OPHTHALMIC

## 2022-09-16 MED ORDER — ARMC OPHTHALMIC DILATING DROPS
1.0000 | OPHTHALMIC | Status: DC | PRN
  Administered 2022-09-16 (×3): 1 via OPHTHALMIC

## 2022-09-16 MED ORDER — MIDAZOLAM HCL 2 MG/2ML IJ SOLN
INTRAMUSCULAR | Status: DC | PRN
  Administered 2022-09-16: 1 mg via INTRAVENOUS

## 2022-09-16 MED ORDER — FENTANYL CITRATE (PF) 100 MCG/2ML IJ SOLN
INTRAMUSCULAR | Status: DC | PRN
  Administered 2022-09-16: 50 ug via INTRAVENOUS

## 2022-09-16 MED ORDER — LIDOCAINE HCL (PF) 2 % IJ SOLN
INTRAOCULAR | Status: DC | PRN
  Administered 2022-09-16: 1 mL via INTRAOCULAR

## 2022-09-16 MED ORDER — SODIUM CHLORIDE 0.9% FLUSH
INTRAVENOUS | Status: DC | PRN
  Administered 2022-09-16: 10 mL via INTRAVENOUS

## 2022-09-16 MED ORDER — SIGHTPATH DOSE#1 NA HYALUR & NA CHOND-NA HYALUR IO KIT
PACK | INTRAOCULAR | Status: DC | PRN
  Administered 2022-09-16: 1 via OPHTHALMIC

## 2022-09-16 SURGICAL SUPPLY — 14 items
CATARACT SUITE SIGHTPATH (MISCELLANEOUS) ×1 IMPLANT
DISSECTOR HYDRO NUCLEUS 50X22 (MISCELLANEOUS) ×1 IMPLANT
FEE CATARACT SUITE SIGHTPATH (MISCELLANEOUS) ×1 IMPLANT
GLOVE SURG GAMMEX PI TX LF 7.5 (GLOVE) ×1 IMPLANT
GLOVE SURG SYN 8.5  E (GLOVE) ×1
GLOVE SURG SYN 8.5 E (GLOVE) ×1 IMPLANT
GLOVE SURG SYN 8.5 PF PI (GLOVE) ×1 IMPLANT
LENS CLAREON PAN OPTIX 20.0 ×1 IMPLANT
LENS IOL CLRN PAN 20.0 IMPLANT
NDL FILTER BLUNT 18X1 1/2 (NEEDLE) ×1 IMPLANT
NEEDLE FILTER BLUNT 18X1 1/2 (NEEDLE) ×1 IMPLANT
SYR 3ML LL SCALE MARK (SYRINGE) ×1 IMPLANT
SYR 5ML LL (SYRINGE) ×1 IMPLANT
WATER STERILE IRR 250ML POUR (IV SOLUTION) ×1 IMPLANT

## 2022-09-16 NOTE — H&P (Signed)
Michele Long   Primary Care Physician:  Leone Haven, MD Ophthalmologist: Dr. Benay Pillow  Pre-Procedure History & Physical: HPI:  Michele Long is a 72 y.o. female here for cataract surgery.   Past Medical History:  Diagnosis Date   Allergy    Cataracts, bilateral    Depression    DVT (deep venous thrombosis) ([place]) 2010   Bilateral lower legs   Edema    Frequent headaches    Stress - 3-4x/yr   Gastric ulcer 10/27/2015   GERD (gastroesophageal reflux disease)    HTN (hypertension)    Microalbuminuria    Migraine    None for several years   Monocular diplopia of right eye 10/22/2016   Shingles    Symptomatic menopausal or female climacteric states    UTI (lower urinary tract infection)    Wears hearing aid in both ears     Past Surgical History:  Procedure Laterality Date   AUGMENTATION MAMMAPLASTY Bilateral [place]   COLONOSCOPY WITH PROPOFOL N/A 07/08/2022   Procedure: COLONOSCOPY WITH PROPOFOL;  Surgeon: Jonathon Bellows, MD;  Location: [facility] ENDOSCOPY;  Service: Gastroenterology;  Laterality: N/A;   ESOPHAGOGASTRODUODENOSCOPY N/A 07/08/2022   Procedure: ESOPHAGOGASTRODUODENOSCOPY (EGD);  Surgeon: Jonathon Bellows, MD;  Location: [facility] ENDOSCOPY;  Service: Gastroenterology;  Laterality: N/A;   NASAL SEPTUM SURGERY  2000    Prior to Admission medications   Medication Sig Start Date End Date Taking? Authorizing Provider  benzonatate (TESSALON) 100 MG capsule Take 1 capsule (100 mg total) by mouth 3 (three) times daily as needed for cough. 02/26/22  Yes Burnard Hawthorne, FNP  buPROPion (WELLBUTRIN XL) 150 MG 24 hr tablet TAKE 2 TABLETS BY MOUTH DAILY 06/04/22  Yes Leone Haven, MD  citalopram (CELEXA) 40 MG tablet TAKE 1 TABLET BY MOUTH EVERY DAY 01/22/22  Yes Leone Haven, MD  [facility] Liver Oil CAPS Take 1 capsule by mouth daily.   Yes [provider]  furosemide (LASIX) 20 MG tablet TAKE 1 TABLET BY MOUTH EVERY  DAY Patient taking differently: Take 20 mg by mouth as needed. 01/31/22  Yes Leone Haven, MD  Magnesium Citrate 100 MG CAPS Take 1 capsule by mouth daily.   Yes [provider]  meloxicam (MOBIC) 15 MG tablet Take 15 mg by mouth daily. 12/13/21  Yes [provider]  OVER THE COUNTER MEDICATION Take 1 capsule by mouth daily. Recuperate IQ   Yes [provider]  OVER THE COUNTER MEDICATION Take 1 capsule by mouth daily. Gut support   Yes [provider]  pantoprazole (PROTONIX) 20 MG tablet Take 1 tablet (20 mg total) by mouth daily. 06/18/22  Yes Jonathon Bellows, MD  Probiotic Product (SUPER PROBIOTIC DIGESTIVE) CAPS Take 1 capsule by mouth daily.   Yes [provider]  Alroy Bailiff OIL 1 drop by Does not apply route daily. With [place], [place], [place], Glycerine and Organic Cane Ethanol   Yes [provider]  Vitamin D, Ergocalciferol, (DRISDOL) 1.25 MG (50000 UNIT) CAPS capsule Take 1 capsule (50,000 Units total) by mouth every 7 (seven) days. 02/07/22  Yes Dutch Quint B, FNP  hydrOXYzine (ATARAX) 10 MG tablet TAKE 1 TABLET (10 MG TOTAL) BY MOUTH 3 (THREE) TIMES DAILY AS NEEDED FOR ANXIETY. Patient not taking: Reported on 09/11/2022 12/28/21   Leone Haven, MD  methocarbamol (ROBAXIN) 500 MG tablet Take 500 mg by mouth 3 (three) times daily. Patient not taking: Reported on 09/11/2022 12/27/21  [provider]    Allergies as of 06/06/2022 - Review Complete 03/27/2022  Allergen Reaction Noted   Erythromycin Nausea And Vomiting 10/23/2015   Sulfa antibiotics Nausea And Vomiting and Nausea Only 10/03/2016    Family History  Problem Relation Age of Onset   Arthritis Mother    Hypertension Mother    Hypertension Father    Osteoarthritis Father    Migraines Father    Cancer Maternal Grandmother    Diabetes Maternal Grandfather    Heart disease Maternal Grandfather    Arthritis Paternal Grandmother    Cancer Paternal  Grandfather        Prostate   Pancreatic cancer Paternal Grandfather    Diabetes Brother     Social History   Socioeconomic History   Marital status: Widowed    Spouse name: Not on file   Number of children: Not on file   Years of education: Not on file   Highest education level: Not on file  Occupational History   Not on file  Tobacco Use   Smoking status: Former    Types: Cigarettes    Quit date: 60    Years since quitting: 52.1   Smokeless tobacco: Never   Tobacco comments:    Smoked for 10 months  Vaping Use   Vaping Use: Never used  Substance and Sexual Activity   Alcohol use: No    Alcohol/week: 0.0 standard drinks of alcohol   Drug use: No   Sexual activity: Never  Other Topics Concern   Not on file  Social History Narrative   2 Children (1 daughter 29) and 1 son passed away at 7 months    2 dogs    Caffeine 1 cup coffee   Widower    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    Social Determinants of Health   Financial Resource Strain: Low Risk  (03/27/2022)   Overall Financial Resource Strain (CARDIA)    Difficulty of Paying Living Expenses: Not hard at all  Food Insecurity: No Food Insecurity (03/27/2022)   Hunger Vital Sign    Worried About Running Out of Food in the Last Year: Never true    [place] in the Last Year: Never true  Transportation Needs: No Transportation Needs (03/27/2022)   PRAPARE - Hydrologist (Medical): No    Lack of Transportation (Non-Medical): No  Physical Activity: Not on file  Stress: No Stress Concern Present (03/27/2022)   [place]    Feeling of Stress : Only a little  Social Connections: Unknown (03/27/2022)   Social Connection and Isolation Panel [NHANES]    Frequency of Communication with Friends and Family: Twice a week    Frequency of Social Gatherings with Friends and Family: More than three times a week    Attends Religious  Services: Not on Advertising copywriter or Organizations: Not on file    Attends Archivist Meetings: Not on file    Marital Status: Married  Intimate Partner Violence: Not At Risk (03/27/2022)   Humiliation, Afraid, Rape, and Kick questionnaire    Fear of Current or Ex-Partner: No    Emotionally Abused: No    Physically Abused: No    Sexually Abused: No    Review of Systems: See HPI, otherwise negative ROS  Physical Exam: BP (!) 193/75   Pulse 72   Temp (!) 95.7 F (35.4 C) (  Temporal)   Resp (!) 22   Ht 5' 5"$  (1.651 m)   Wt 98.4 kg   SpO2 94%   BMI 36.11 kg/m  General:   Alert, cooperative in NAD Head:  Normocephalic and atraumatic. Respiratory:  Normal work of breathing. Cardiovascular:  RRR  Impression/Plan: Michele Long is here for cataract surgery.  Risks, benefits, limitations, and alternatives regarding cataract surgery have been reviewed with the patient.  Questions have been answered.  All parties agreeable.   Benay Pillow, MD  09/16/2022, 12:15 PM

## 2022-09-16 NOTE — Anesthesia Preprocedure Evaluation (Addendum)
Anesthesia Evaluation  Patient identified by MRN, date of birth, ID band Patient awake    Reviewed: Allergy & Precautions, NPO status , Patient's Chart, lab work & pertinent test results  History of Anesthesia Complications Negative for: history of anesthetic complications  Airway Mallampati: III  TM Distance: <3 FB Neck ROM: full    Dental  (+) Chipped   Pulmonary neg shortness of breath, former smoker   Pulmonary exam normal        Cardiovascular Exercise Tolerance: Good (-) angina (-) Past MI and (-) DOE Normal cardiovascular exam     Neuro/Psych  Headaches PSYCHIATRIC DISORDERS         GI/Hepatic Neg liver ROS,GERD  Controlled,,  Endo/Other  negative endocrine ROS    Renal/GU negative Renal ROS  negative genitourinary   Musculoskeletal   Abdominal Normal abdominal exam  (+)   Peds  Hematology negative hematology ROS (+)   Anesthesia Other Findings Past Medical History: No date: Allergy No date: Cataracts, bilateral No date: Depression No date: DVT (deep venous thrombosis) (HCC) No date: Edema No date: Frequent headaches 10/27/2015: Gastric ulcer No date: GERD (gastroesophageal reflux disease) No date: HTN (hypertension) No date: Microalbuminuria No date: Migraine 10/22/2016: Monocular diplopia of right eye No date: Shingles No date: Symptomatic menopausal or female climacteric states No date: UTI (lower urinary tract infection)  Past Surgical History: 1983: AUGMENTATION MAMMAPLASTY; Bilateral 1983: BREAST ENHANCEMENT SURGERY 2000: NASAL SEPTUM SURGERY  BMI    Body Mass Index: 36.61 kg/m      Reproductive/Obstetrics negative OB ROS                             Anesthesia Physical Anesthesia Plan  ASA: 2  Anesthesia Plan: MAC   Post-op Pain Management:    Induction: Intravenous  PONV Risk Score and Plan: Midazolam  Airway Management Planned: Natural Airway  and Nasal Cannula  Additional Equipment:   Intra-op Plan:   Post-operative Plan:   Informed Consent: I have reviewed the patients History and Physical, chart, labs and discussed the procedure including the risks, benefits and alternatives for the proposed anesthesia with the patient or authorized representative who has indicated his/her understanding and acceptance.       Plan Discussed with: Anesthesiologist, CRNA and Surgeon  Anesthesia Plan Comments:        Anesthesia Quick Evaluation

## 2022-09-16 NOTE — Anesthesia Procedure Notes (Signed)
Procedure Name: MAC Date/Time: 09/16/2022 12:27 PM  Performed by: Hilbert Odor, CRNAPre-anesthesia Checklist: Patient identified, Emergency Drugs available, Suction available, Patient being monitored and Timeout performed Oxygen Delivery Method: Nasal cannula Induction Type: IV induction

## 2022-09-16 NOTE — Anesthesia Postprocedure Evaluation (Signed)
Anesthesia Post Note  Patient: Michele Long  Procedure(s) Performed: CATARACT EXTRACTION PHACO AND INTRAOCULAR LENS PLACEMENT (IOC) RIGHT  clareon panoptic lens  3.61  00:26.9 (Right: Eye)  Patient location during evaluation: PACU Anesthesia Type: MAC Level of consciousness: awake and alert Pain management: pain level controlled Vital Signs Assessment: post-procedure vital signs reviewed and stable Respiratory status: spontaneous breathing, nonlabored ventilation and respiratory function stable Cardiovascular status: blood pressure returned to baseline and stable Postop Assessment: no apparent nausea or vomiting Anesthetic complications: no   No notable events documented.   Last Vitals:  Vitals:   09/16/22 1242 09/16/22 1247  BP: (!) 162/78 (!) 155/77  Pulse: 71   Resp:    Temp: (!) 36.3 C (!) 36.3 C  SpO2: 97%     Last Pain:  Vitals:   09/16/22 1247  TempSrc:   PainSc: 0-No pain                 Iran Ouch

## 2022-09-16 NOTE — Transfer of Care (Signed)
Immediate Anesthesia Transfer of Care Note  Patient: Michele Long  Procedure(s) Performed: CATARACT EXTRACTION PHACO AND INTRAOCULAR LENS PLACEMENT (IOC) RIGHT  clareon panoptic lens  3.61  00:26.9 (Right: Eye)  Patient Location: PACU  Anesthesia Type: MAC  Level of Consciousness: awake, alert  and patient cooperative  Airway and Oxygen Therapy: Patient Spontanous Breathing and Patient connected to supplemental oxygen  Post-op Assessment: Post-op Vital signs reviewed, Patient's Cardiovascular Status Stable, Respiratory Function Stable, Patent Airway and No signs of Nausea or vomiting  Post-op Vital Signs: Reviewed and stable  Complications: No notable events documented.

## 2022-09-16 NOTE — Op Note (Signed)
OPERATIVE NOTE  Michele Long [id number] 09/16/2022   PREOPERATIVE DIAGNOSIS:  Nuclear sclerotic cataract right eye.  H25.11   POSTOPERATIVE DIAGNOSIS:    Nuclear sclerotic cataract right eye.     PROCEDURE:  Phacoemusification with posterior chamber intraocular lens placement of the right eye   LENS:   Implant Name Type Inv. Item Serial No. Manufacturer Lot No. LRB No. Used Action  LENS CLAREON PAN OPTIX 20.0 - [id number]  LENS CLAREON PAN OPTIX 20.0 [id number] SIGHTPATH  Right 1 Implanted       Procedure(s): CATARACT EXTRACTION PHACO AND INTRAOCULAR LENS PLACEMENT (IOC) RIGHT  clareon panoptic lens  3.61  00:26.9 (Right)  [id number] +20.0   ULTRASOUND TIME: 0 minutes 26 seconds.  CDE 3.61   SURGEON:  Benay Pillow, MD, MPH  ANESTHESIOLOGIST: Anesthesiologist: [place] Ouch, MD CRNA: Hilbert Odor, CRNA   ANESTHESIA:  Topical with tetracaine drops augmented with 1% preservative-free intracameral lidocaine.  ESTIMATED BLOOD LOSS: less than 1 mL.   COMPLICATIONS:  None.   DESCRIPTION OF PROCEDURE:  The patient was identified in the holding room and transported to the operating room and placed in the supine position under the operating microscope.  The right eye was identified as the operative eye and it was prepped and draped in the usual sterile ophthalmic fashion.   A 1.0 millimeter clear-corneal paracentesis was made at the 10:30 position. 0.5 ml of preservative-free 1% lidocaine with epinephrine was injected into the anterior chamber.  The anterior chamber was filled with viscoelastic.  A 2.4 millimeter keratome was used to make a near-clear corneal incision at the 8:00 position.  A curvilinear capsulorrhexis was made with a cystotome and capsulorrhexis forceps.  Balanced salt solution was used to hydrodissect and hydrodelineate the nucleus.   Phacoemulsification was then used in stop and chop fashion to remove the lens nucleus and epinucleus.  The remaining cortex  was then removed using the irrigation and aspiration handpiece. Viscoelastic was then placed into the capsular bag to distend it for lens placement.  A lens was then injected into the capsular bag.  The remaining viscoelastic was aspirated.   Wounds were hydrated with balanced salt solution.  The anterior chamber was inflated to a physiologic pressure with balanced salt solution.   Intracameral vigamox 0.1 mL undiluted was injected into the eye and a drop placed onto the ocular surface.  No wound leaks were noted.  The patient was taken to the recovery room in stable condition without complications of anesthesia or surgery  Benay Pillow 09/16/2022, 12:41 PM

## 2022-09-17 ENCOUNTER — Encounter: Payer: Self-pay | Admitting: Ophthalmology

## 2022-09-17 DIAGNOSIS — H2512 Age-related nuclear cataract, left eye: Secondary | ICD-10-CM | POA: Diagnosis not present

## 2022-09-30 ENCOUNTER — Encounter: Payer: Self-pay | Admitting: Ophthalmology

## 2022-10-03 NOTE — Discharge Instructions (Signed)

## 2022-10-04 ENCOUNTER — Other Ambulatory Visit: Payer: Self-pay | Admitting: Family Medicine

## 2022-10-07 ENCOUNTER — Encounter: Payer: Self-pay | Admitting: Ophthalmology

## 2022-10-07 ENCOUNTER — Ambulatory Visit
Admission: RE | Admit: 2022-10-07 | Discharge: 2022-10-07 | Disposition: A | Discharge status: Home / Self Care | Payer: PPO | Attending: Ophthalmology | Admitting: Ophthalmology

## 2022-10-07 ENCOUNTER — Encounter
Admission: RE | Disposition: A | Discharge status: Home / Self Care | Payer: Self-pay | Source: Home / Self Care | Attending: Ophthalmology

## 2022-10-07 ENCOUNTER — Ambulatory Visit: Payer: PPO | Admitting: Anesthesiology

## 2022-10-07 ENCOUNTER — Other Ambulatory Visit: Payer: Self-pay

## 2022-10-07 DIAGNOSIS — Z87891 Personal history of nicotine dependence: Secondary | ICD-10-CM | POA: Insufficient documentation

## 2022-10-07 DIAGNOSIS — Z09 Encounter for follow-up examination after completed treatment for conditions other than malignant neoplasm: Secondary | ICD-10-CM | POA: Diagnosis not present

## 2022-10-07 DIAGNOSIS — H269 Unspecified cataract: Secondary | ICD-10-CM | POA: Diagnosis not present

## 2022-10-07 DIAGNOSIS — F32A Depression, unspecified: Secondary | ICD-10-CM | POA: Insufficient documentation

## 2022-10-07 DIAGNOSIS — G43909 Migraine, unspecified, not intractable, without status migrainosus: Secondary | ICD-10-CM | POA: Insufficient documentation

## 2022-10-07 DIAGNOSIS — K219 Gastro-esophageal reflux disease without esophagitis: Secondary | ICD-10-CM | POA: Insufficient documentation

## 2022-10-07 DIAGNOSIS — I10 Essential (primary) hypertension: Secondary | ICD-10-CM | POA: Insufficient documentation

## 2022-10-07 DIAGNOSIS — H2512 Age-related nuclear cataract, left eye: Secondary | ICD-10-CM | POA: Diagnosis not present

## 2022-10-07 HISTORY — PX: CATARACT EXTRACTION W/PHACO: SHX586

## 2022-10-07 SURGERY — PHACOEMULSIFICATION, CATARACT, WITH IOL INSERTION
Anesthesia: Monitor Anesthesia Care | Site: Eye | Laterality: Left

## 2022-10-07 MED ORDER — MOXIFLOXACIN HCL 0.5 % OP SOLN
OPHTHALMIC | Status: DC | PRN
  Administered 2022-10-07: .2 mL via OPHTHALMIC

## 2022-10-07 MED ORDER — SIGHTPATH DOSE#1 NA HYALUR & NA CHOND-NA HYALUR IO KIT
PACK | INTRAOCULAR | Status: DC | PRN
  Administered 2022-10-07: 1 via OPHTHALMIC

## 2022-10-07 MED ORDER — ARMC OPHTHALMIC DILATING DROPS
1.0000 | OPHTHALMIC | Status: DC | PRN
  Administered 2022-10-07 (×3): 1 via OPHTHALMIC

## 2022-10-07 MED ORDER — SIGHTPATH DOSE#1 BSS IO SOLN
INTRAOCULAR | Status: DC | PRN
  Administered 2022-10-07: 15 mL

## 2022-10-07 MED ORDER — TETRACAINE HCL 0.5 % OP SOLN
1.0000 [drp] | OPHTHALMIC | Status: DC | PRN
  Administered 2022-10-07 (×3): 1 [drp] via OPHTHALMIC

## 2022-10-07 MED ORDER — FENTANYL CITRATE (PF) 100 MCG/2ML IJ SOLN
INTRAMUSCULAR | Status: DC | PRN
  Administered 2022-10-07: 50 ug via INTRAVENOUS

## 2022-10-07 MED ORDER — MIDAZOLAM HCL 2 MG/2ML IJ SOLN
INTRAMUSCULAR | Status: DC | PRN
  Administered 2022-10-07: 1 mg via INTRAVENOUS

## 2022-10-07 MED ORDER — LIDOCAINE HCL (PF) 2 % IJ SOLN
INTRAOCULAR | Status: DC | PRN
  Administered 2022-10-07: 1 mL via INTRAOCULAR

## 2022-10-07 MED ORDER — SIGHTPATH DOSE#1 BSS IO SOLN
INTRAOCULAR | Status: DC | PRN
  Administered 2022-10-07: 91 mL via OPHTHALMIC

## 2022-10-07 SURGICAL SUPPLY — 14 items
CATARACT SUITE SIGHTPATH (MISCELLANEOUS) ×1 IMPLANT
DISSECTOR HYDRO NUCLEUS 50X22 (MISCELLANEOUS) ×1 IMPLANT
FEE CATARACT SUITE SIGHTPATH (MISCELLANEOUS) ×1 IMPLANT
GLOVE SURG GAMMEX PI TX LF 7.5 (GLOVE) ×1 IMPLANT
GLOVE SURG SYN 8.5  E (GLOVE) ×1
GLOVE SURG SYN 8.5 E (GLOVE) ×1 IMPLANT
GLOVE SURG SYN 8.5 PF PI (GLOVE) ×1 IMPLANT
LENS CLAREON PAN OPTIX 20.0 ×1 IMPLANT
LENS IOL CLRN PAN 20.0 IMPLANT
NDL FILTER BLUNT 18X1 1/2 (NEEDLE) ×1 IMPLANT
NEEDLE FILTER BLUNT 18X1 1/2 (NEEDLE) ×1 IMPLANT
SYR 3ML LL SCALE MARK (SYRINGE) ×1 IMPLANT
SYR 5ML LL (SYRINGE) ×1 IMPLANT
WATER STERILE IRR 250ML POUR (IV SOLUTION) ×1 IMPLANT

## 2022-10-07 NOTE — Addendum Note (Signed)
Addendum  created 10/07/22 1519 by Iran Ouch, MD   Clinical Note Signed, Review and Sign - Ready for Procedure

## 2022-10-07 NOTE — Op Note (Signed)
OPERATIVE NOTE  Michele Long [id number] 10/07/2022   PREOPERATIVE DIAGNOSIS:  Nuclear sclerotic cataract left eye.  H25.12   POSTOPERATIVE DIAGNOSIS:    Nuclear sclerotic cataract left eye.     PROCEDURE:  Phacoemusification with posterior chamber intraocular lens placement of the left eye   LENS:   Implant Name Type Inv. Item Serial No. Manufacturer Lot No. LRB No. Used Action  LENS CLAREON PAN OPTIX 20.0 - [id number]  LENS CLAREON PAN OPTIX 20.0 [id number] SIGHTPATH  Left 1 Implanted      Procedure(s): CATARACT EXTRACTION PHACO AND INTRAOCULAR LENS PLACEMENT (IOC) LEFT  clarion panoptic lens  3.81  00:40.9 (Left)  CNWTT0 +20.0   ULTRASOUND TIME: 0 minutes 40 seconds.  CDE 3.81   SURGEON:  Benay Pillow, MD, MPH   ANESTHESIA:  Topical with tetracaine drops augmented with 1% preservative-free intracameral lidocaine.  ESTIMATED BLOOD LOSS: <1 mL   COMPLICATIONS:  None.   DESCRIPTION OF PROCEDURE:  The patient was identified in the holding room and transported to the operating room and placed in the supine position under the operating microscope.  The left eye was identified as the operative eye and it was prepped and draped in the usual sterile ophthalmic fashion.   A 1.0 millimeter clear-corneal paracentesis was made at the 5:00 position. 0.5 ml of preservative-free 1% lidocaine with epinephrine was injected into the anterior chamber.  The anterior chamber was filled with viscoelastic.  A 2.4 millimeter keratome was used to make a near-clear corneal incision at the 2:00 position.  A curvilinear capsulorrhexis was made with a cystotome and capsulorrhexis forceps.  Balanced salt solution was used to hydrodissect and hydrodelineate the nucleus.   Phacoemulsification was then used in stop and chop fashion to remove the lens nucleus and epinucleus.  The remaining cortex was then removed using the irrigation and aspiration handpiece. Viscoelastic was then placed into the capsular bag to  distend it for lens placement.  A lens was then injected into the capsular bag.  The remaining viscoelastic was aspirated.   Wounds were hydrated with balanced salt solution.  The anterior chamber was inflated to a physiologic pressure with balanced salt solution.  Intracameral vigamox 0.1 mL undiltued was injected into the eye and a drop placed onto the ocular surface.  No wound leaks were noted.  The patient was taken to the recovery room in stable condition without complications of anesthesia or surgery  Benay Pillow 10/07/2022, 1:10 PM

## 2022-10-07 NOTE — Transfer of Care (Signed)
Immediate Anesthesia Transfer of Care Note  Patient: Michele Long  Procedure(s) Performed: CATARACT EXTRACTION PHACO AND INTRAOCULAR LENS PLACEMENT (IOC) LEFT  clarion panoptic lens  3.81  00:40.9 (Left: Eye)  Patient Location: PACU  Anesthesia Type: MAC  Level of Consciousness: awake, alert  and patient cooperative  Airway and Oxygen Therapy: Patient Spontanous Breathing and Patient connected to supplemental oxygen  Post-op Assessment: Post-op Vital signs reviewed, Patient's Cardiovascular Status Stable, Respiratory Function Stable, Patent Airway and No signs of Nausea or vomiting  Post-op Vital Signs: Reviewed and stable  Complications: No notable events documented.

## 2022-10-07 NOTE — Anesthesia Postprocedure Evaluation (Signed)
Anesthesia Post Note  Patient: Michele Long  Procedure(s) Performed: CATARACT EXTRACTION PHACO AND INTRAOCULAR LENS PLACEMENT (IOC) LEFT  clarion panoptic lens  3.81  00:40.9 (Left: Eye)  Patient location during evaluation: PACU Anesthesia Type: MAC Level of consciousness: awake and alert Pain management: pain level controlled Vital Signs Assessment: post-procedure vital signs reviewed and stable Respiratory status: spontaneous breathing, nonlabored ventilation and respiratory function stable Cardiovascular status: blood pressure returned to baseline and stable Postop Assessment: no apparent nausea or vomiting Anesthetic complications: no   No notable events documented.   Last Vitals:  Vitals:   10/07/22 1313 10/07/22 1317  BP: (!) 142/73 135/68  Pulse: 66 64  Resp: 16 18  Temp: (!) 36.3 C (!) 36.4 C  SpO2: 95% 94%    Last Pain:  Vitals:   10/07/22 1317  PainSc: 0-No pain                 Iran Ouch

## 2022-10-07 NOTE — H&P (Signed)
[facility]   Primary Care Physician:  Leone Haven, MD Ophthalmologist: Dr. Benay Pillow  Pre-Procedure History & Physical: HPI:  Michele Long is a 72 y.o. female here for cataract surgery.   Past Medical History:  Diagnosis Date   Allergy    Cataracts, bilateral    Depression    DVT (deep venous thrombosis) ([place]) 2010   Bilateral lower legs   Edema    Frequent headaches    Stress - 3-4x/yr   Gastric ulcer 10/27/2015   GERD (gastroesophageal reflux disease)    HTN (hypertension)    Microalbuminuria    Migraine    None for several years   Monocular diplopia of right eye 10/22/2016   Shingles    Symptomatic menopausal or female climacteric states    UTI (lower urinary tract infection)    Wears hearing aid in both ears     Past Surgical History:  Procedure Laterality Date   AUGMENTATION MAMMAPLASTY Bilateral [place]   CATARACT EXTRACTION W/PHACO Right 09/16/2022   Procedure: CATARACT EXTRACTION PHACO AND INTRAOCULAR LENS PLACEMENT ([place]) RIGHT  clareon panoptic lens  3.61  00:26.9;  Surgeon: Eulogio Bear, MD;  Location: [place];  Service: Ophthalmology;  Laterality: Right;   COLONOSCOPY WITH PROPOFOL N/A 07/08/2022   Procedure: COLONOSCOPY WITH PROPOFOL;  Surgeon: Jonathon Bellows, MD;  Location: [facility] ENDOSCOPY;  Service: Gastroenterology;  Laterality: N/A;   ESOPHAGOGASTRODUODENOSCOPY N/A 07/08/2022   Procedure: ESOPHAGOGASTRODUODENOSCOPY (EGD);  Surgeon: Jonathon Bellows, MD;  Location: [facility] ENDOSCOPY;  Service: Gastroenterology;  Laterality: N/A;   NASAL SEPTUM SURGERY  2000    Prior to Admission medications   Medication Sig Start Date End Date Taking? Authorizing Provider  benzonatate (TESSALON) 100 MG capsule Take 1 capsule (100 mg total) by mouth 3 (three) times daily as needed for cough. 02/26/22  Yes Burnard Hawthorne, FNP  buPROPion (WELLBUTRIN XL) 150 MG 24 hr tablet TAKE 2 TABLETS BY MOUTH DAILY 06/04/22  Yes  Leone Haven, MD  citalopram (CELEXA) 40 MG tablet TAKE 1 TABLET BY MOUTH EVERY DAY 10/04/22  Yes Leone Haven, MD  [facility] Liver Oil CAPS Take 1 capsule by mouth daily.   Yes [provider]  furosemide (LASIX) 20 MG tablet TAKE 1 TABLET BY MOUTH EVERY DAY Patient taking differently: Take 20 mg by mouth as needed. 01/31/22  Yes Leone Haven, MD  hydrOXYzine (ATARAX) 10 MG tablet TAKE 1 TABLET (10 MG TOTAL) BY MOUTH 3 (THREE) TIMES DAILY AS NEEDED FOR ANXIETY. 12/28/21  Yes Leone Haven, MD  Magnesium Citrate 100 MG CAPS Take 1 capsule by mouth daily.   Yes [provider]  meloxicam (MOBIC) 15 MG tablet Take 15 mg by mouth daily. 12/13/21  Yes [provider]  methocarbamol (ROBAXIN) 500 MG tablet Take 500 mg by mouth 3 (three) times daily. 12/27/21  Yes [provider]  OVER THE COUNTER MEDICATION Take 1 capsule by mouth daily. Recuperate IQ   Yes [provider]  OVER THE COUNTER MEDICATION Take 1 capsule by mouth daily. Gut support   Yes [provider]  pantoprazole (PROTONIX) 20 MG tablet Take 1 tablet (20 mg total) by mouth daily. 06/18/22  Yes Jonathon Bellows, MD  Probiotic Product (SUPER PROBIOTIC DIGESTIVE) CAPS Take 1 capsule by mouth daily.   Yes [provider]  Alroy Bailiff OIL 1 drop by Does not apply route daily. With [place], [place], [place], Glycerine and [employer]  Ethanol   Yes [provider]  Vitamin D, Ergocalciferol, (DRISDOL) 1.25 MG (50000 UNIT) CAPS capsule Take 1 capsule (50,000 Units total) by mouth every 7 (seven) days. 02/07/22  Yes Dutch Quint B, FNP    Allergies as of 06/06/2022 - Review Complete 03/27/2022  Allergen Reaction Noted   Erythromycin Nausea And Vomiting 10/23/2015   Sulfa antibiotics Nausea And Vomiting and Nausea Only 10/03/2016    Family History  Problem Relation Age of Onset   Arthritis Mother    Hypertension Mother    Hypertension Father    Osteoarthritis  Father    Migraines Father    Cancer Maternal Grandmother    Diabetes Maternal Grandfather    Heart disease Maternal Grandfather    Arthritis Paternal Grandmother    Cancer Paternal Grandfather        Prostate   Pancreatic cancer Paternal Grandfather    Diabetes Brother     Social History   Socioeconomic History   Marital status: Widowed    Spouse name: Not on file   Number of children: Not on file   Years of education: Not on file   Highest education level: Not on file  Occupational History   Not on file  Tobacco Use   Smoking status: Former    Types: Cigarettes    Quit date: 72    Years since quitting: 52.2   Smokeless tobacco: Never   Tobacco comments:    Smoked for 10 months  Vaping Use   Vaping Use: Never used  Substance and Sexual Activity   Alcohol use: No    Alcohol/week: 0.0 standard drinks of alcohol   Drug use: No   Sexual activity: Never  Other Topics Concern   Not on file  Social History Narrative   2 Children (1 daughter 63) and 1 son passed away at 7 months    2 dogs    Caffeine 1 cup coffee   Widower    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    Social Determinants of Health   Financial Resource Strain: Low Risk  (03/27/2022)   Overall Financial Resource Strain (CARDIA)    Difficulty of Paying Living Expenses: Not hard at all  Food Insecurity: No Food Insecurity (03/27/2022)   Hunger Vital Sign    Worried About Running Out of Food in the Last Year: Never true    [place] in the Last Year: Never true  Transportation Needs: No Transportation Needs (03/27/2022)   PRAPARE - Hydrologist (Medical): No    Lack of Transportation (Non-Medical): No  Physical Activity: Not on file  Stress: No Stress Concern Present (03/27/2022)   [place]    Feeling of Stress : Only a little  Social Connections: Unknown (03/27/2022)   Social Connection and Isolation Panel  [NHANES]    Frequency of Communication with Friends and Family: Twice a week    Frequency of Social Gatherings with Friends and Family: More than three times a week    Attends Religious Services: Not on file    Active Member of Clubs or Organizations: Not on file    Attends Archivist Meetings: Not on file    Marital Status: Married  Intimate Partner Violence: Not At Risk (03/27/2022)   Humiliation, Afraid, Rape, and Kick questionnaire    Fear of Current or Ex-Partner: No    Emotionally Abused: No    Physically Abused: No  Sexually Abused: No    Review of Systems: See HPI, otherwise negative ROS  Physical Exam: BP (!) 182/74   Pulse 68   Temp (!) 96.5 F (35.8 C)   Resp 14   Ht '5\' 5"'$  (1.651 m)   Wt 98.9 kg   SpO2 97%   BMI 36.28 kg/m  General:   Alert, cooperative in NAD Head:  Normocephalic and atraumatic. Respiratory:  Normal work of breathing. Cardiovascular:  RRR  Impression/Plan: Michele Long is here for cataract surgery.  Risks, benefits, limitations, and alternatives regarding cataract surgery have been reviewed with the patient.  Questions have been answered.  All parties agreeable.   Benay Pillow, MD  10/07/2022, 12:43 PM

## 2022-10-07 NOTE — Anesthesia Preprocedure Evaluation (Addendum)
Anesthesia Evaluation  Patient identified by MRN, date of birth, ID band Patient awake    Reviewed: Allergy & Precautions, NPO status , Patient's Chart, lab work & pertinent test results  History of Anesthesia Complications Negative for: history of anesthetic complications  Airway Mallampati: III  TM Distance: <3 FB Neck ROM: full    Dental  (+) Chipped   Pulmonary neg shortness of breath, former smoker   Pulmonary exam normal        Cardiovascular Exercise Tolerance: Good hypertension, (-) angina (-) Past MI and (-) DOE Normal cardiovascular exam     Neuro/Psych  Headaches PSYCHIATRIC DISORDERS         GI/Hepatic Neg liver ROS,GERD  Controlled,,  Endo/Other  negative endocrine ROS    Renal/GU negative Renal ROS  negative genitourinary   Musculoskeletal   Abdominal Normal abdominal exam  (+)   Peds  Hematology negative hematology ROS (+)   Anesthesia Other Findings Past Medical History: No date: Allergy No date: Cataracts, bilateral No date: Depression No date: DVT (deep venous thrombosis) (HCC) No date: Edema No date: Frequent headaches 10/27/2015: Gastric ulcer No date: GERD (gastroesophageal reflux disease) No date: HTN (hypertension) No date: Microalbuminuria No date: Migraine 10/22/2016: Monocular diplopia of right eye No date: Shingles No date: Symptomatic menopausal or female climacteric states No date: UTI (lower urinary tract infection)  Past Surgical History: 1983: AUGMENTATION MAMMAPLASTY; Bilateral 1983: BREAST ENHANCEMENT SURGERY 2000: NASAL SEPTUM SURGERY  BMI    Body Mass Index: 36.61 kg/m      Reproductive/Obstetrics negative OB ROS                              Anesthesia Physical Anesthesia Plan  ASA: 2  Anesthesia Plan: MAC   Post-op Pain Management:    Induction: Intravenous  PONV Risk Score and Plan: Midazolam  Airway Management Planned:  Natural Airway and Nasal Cannula  Additional Equipment:   Intra-op Plan:   Post-operative Plan:   Informed Consent: I have reviewed the patients History and Physical, chart, labs and discussed the procedure including the risks, benefits and alternatives for the proposed anesthesia with the patient or authorized representative who has indicated his/her understanding and acceptance.       Plan Discussed with: Anesthesiologist, CRNA and Surgeon  Anesthesia Plan Comments:         Anesthesia Quick Evaluation

## 2022-10-08 ENCOUNTER — Encounter: Payer: Self-pay | Admitting: Ophthalmology

## 2022-10-28 ENCOUNTER — Other Ambulatory Visit: Payer: Self-pay | Admitting: Family Medicine

## 2022-10-28 DIAGNOSIS — Z1231 Encounter for screening mammogram for malignant neoplasm of breast: Secondary | ICD-10-CM

## 2022-11-07 IMAGING — CR DG KNEE COMPLETE 4+V*L*
4 series · 4 of 4 positions shown · non-contrast
Comparison: None.

CLINICAL DATA: Fall trauma pain

EXAM:
LEFT KNEE - COMPLETE 4+ VIEW

[knee ap]
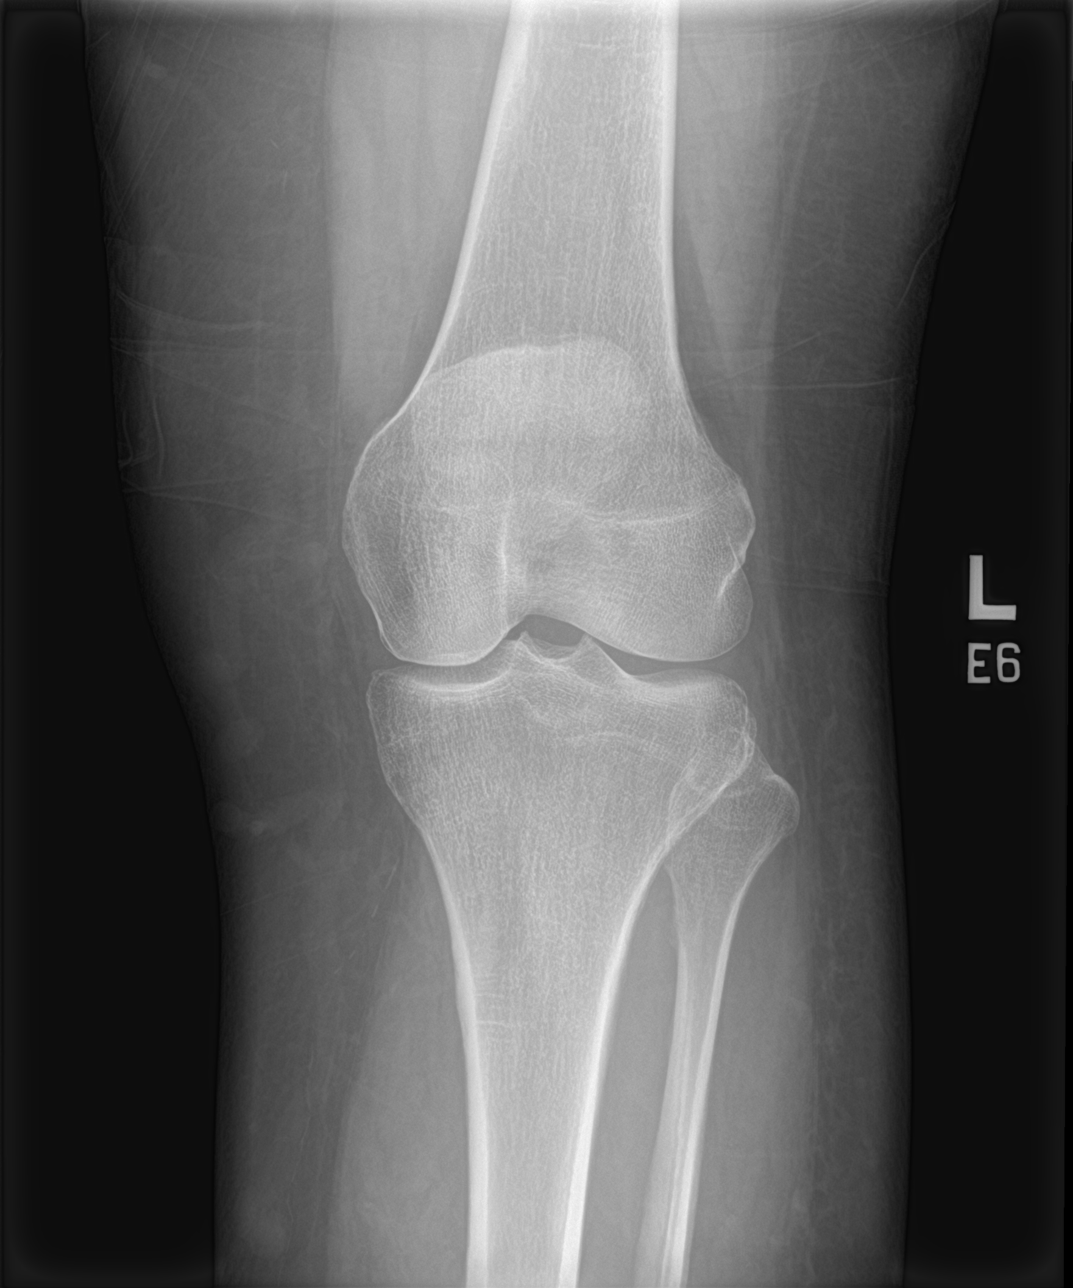

[knee obl (1 of 2)]
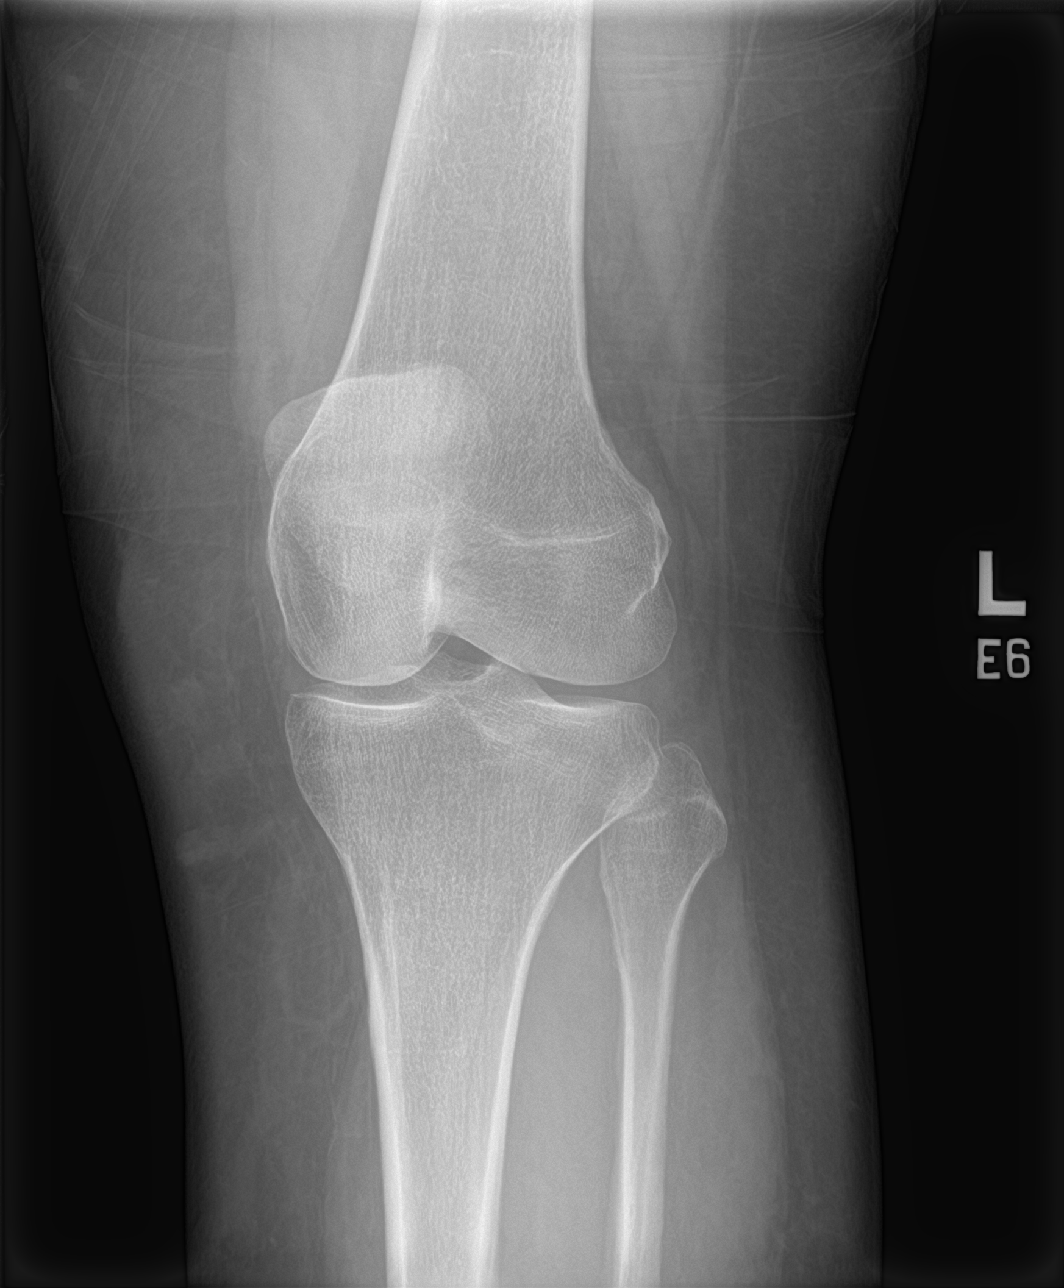

[knee obl (2 of 2)]
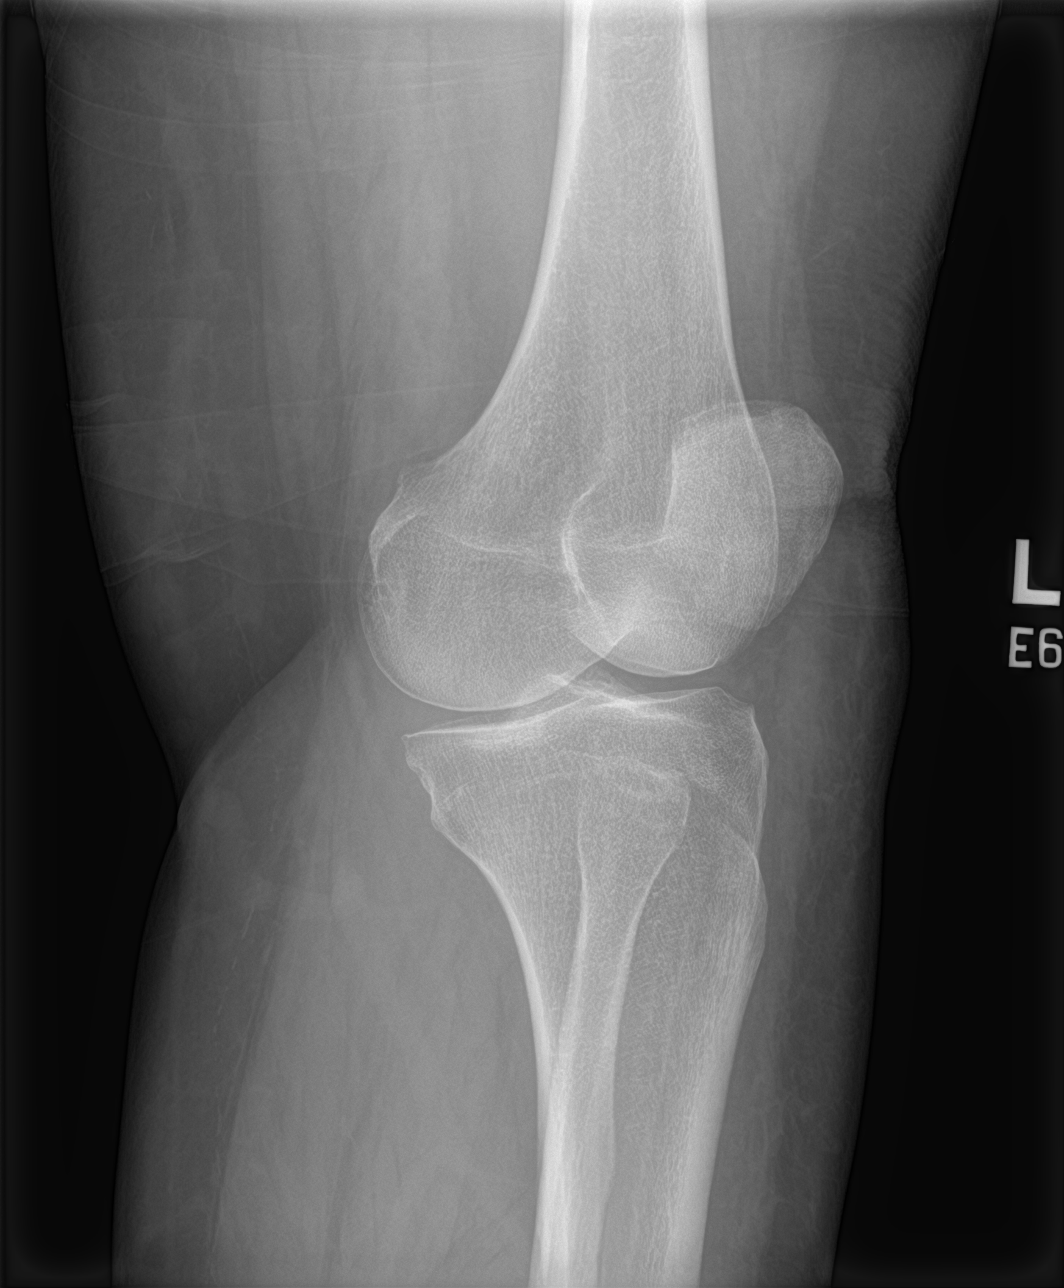

[knee lat]
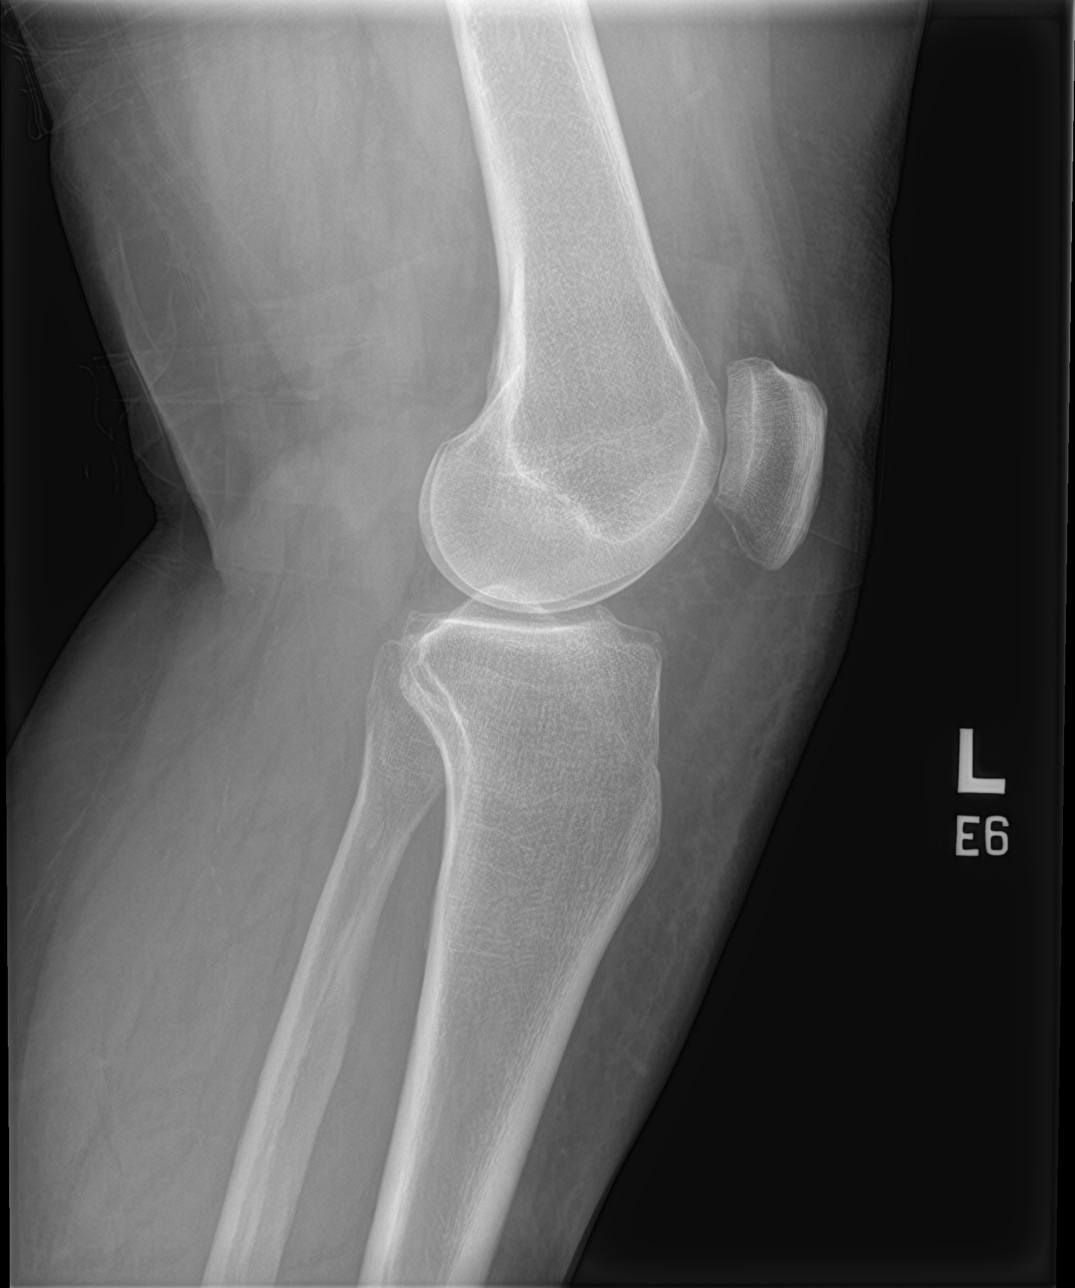

[4 of 4 positions shown; findings below may reference images not displayed]

FINDINGS: No acute fracture or dislocation. Trace joint effusion. Alignment is
unremarkable. The joint spaces are preserved. No focal soft tissue
injury.
IMPRESSION: No acute osseous abnormality.

## 2023-01-02 ENCOUNTER — Telehealth: Payer: Self-pay | Admitting: Family Medicine

## 2023-01-02 NOTE — Telephone Encounter (Signed)
LVMTCB to make follow-up with provider

## 2023-01-03 ENCOUNTER — Other Ambulatory Visit: Payer: Self-pay | Admitting: Family Medicine

## 2023-01-04 ENCOUNTER — Other Ambulatory Visit: Payer: Self-pay | Admitting: Family Medicine

## 2023-02-07 ENCOUNTER — Ambulatory Visit (INDEPENDENT_AMBULATORY_CARE_PROVIDER_SITE_OTHER): Payer: PPO | Admitting: Family Medicine

## 2023-02-07 ENCOUNTER — Encounter: Payer: Self-pay | Admitting: Family Medicine

## 2023-02-07 VITALS — BP 124/82 | HR 79 | Temp 98.7°F | Ht 65.0 in | Wt 216.0 lb

## 2023-02-07 DIAGNOSIS — K219 Gastro-esophageal reflux disease without esophagitis: Secondary | ICD-10-CM | POA: Diagnosis not present

## 2023-02-07 DIAGNOSIS — E785 Hyperlipidemia, unspecified: Secondary | ICD-10-CM | POA: Diagnosis not present

## 2023-02-07 DIAGNOSIS — E6609 Other obesity due to excess calories: Secondary | ICD-10-CM | POA: Diagnosis not present

## 2023-02-07 DIAGNOSIS — M7062 Trochanteric bursitis, left hip: Secondary | ICD-10-CM

## 2023-02-07 DIAGNOSIS — R42 Dizziness and giddiness: Secondary | ICD-10-CM | POA: Diagnosis not present

## 2023-02-07 DIAGNOSIS — Z13220 Encounter for screening for lipoid disorders: Secondary | ICD-10-CM

## 2023-02-07 DIAGNOSIS — Z6835 Body mass index (BMI) 35.0-35.9, adult: Secondary | ICD-10-CM

## 2023-02-07 DIAGNOSIS — E559 Vitamin D deficiency, unspecified: Secondary | ICD-10-CM | POA: Diagnosis not present

## 2023-02-07 DIAGNOSIS — M7061 Trochanteric bursitis, right hip: Secondary | ICD-10-CM | POA: Diagnosis not present

## 2023-02-07 DIAGNOSIS — R413 Other amnesia: Secondary | ICD-10-CM | POA: Diagnosis not present

## 2023-02-07 DIAGNOSIS — F418 Other specified anxiety disorders: Secondary | ICD-10-CM | POA: Diagnosis not present

## 2023-02-07 MED ORDER — MELOXICAM 15 MG PO TABS: 15.0000 mg | ORAL_TABLET | Freq: Every day | ORAL | 0 refills | Status: DC | PRN

## 2023-02-07 NOTE — Assessment & Plan Note (Signed)
Suspect related to orthostasis.  I encouraged her to increase her water intake back to 5 glasses/day.

## 2023-02-07 NOTE — Progress Notes (Signed)
Marikay Alar, MD Phone: [phone]  Michele Long is a 72 y.o. female who presents today for follow-up.  Anxiety/Depression: Patient notes she occasionally has issues with these though they are not as bad as what they had been in the past.  She continues on Wellbutrin and Celexa.  No SI.  She reports her daughter thinks her memory may be getting worse though the patient thinks it is okay.  She occasionally forgets words and forgets stuff from the past though she notes her memory has never been the greatest.  Vitamin D deficiency: She is no longer on vitamin D supplement.  Lightheadedness: Patient notes she will get lightheaded occasionally.  She notes this typically occurs when she is not drinking enough water.  She notes no syncope.  In the past she was drinking 5 glasses of water per day and notes no significant lightheadedness with that though now she has cut back to 3 glasses of water per day and has had intermittent lightheadedness.  Hip bursitis: Patient reports she saw [facility] and they advised her she had hip bursitis.  They placed her on meloxicam and she notes that it was beneficial when she was taking it though she ran out months ago.  She notes she is uncomfortable when she lays on her hips at night. Social History   Tobacco Use  Smoking Status Former   Current packs/day: 0.00   Types: Cigarettes   Quit date: 1972   Years since quitting: 52.5  Smokeless Tobacco Never  Tobacco Comments   Smoked for 10 months    Current Outpatient Medications on File Prior to Visit  Medication Sig Dispense Refill   benzonatate (TESSALON) 100 MG capsule Take 1 capsule (100 mg total) by mouth 3 (three) times daily as needed for cough. 30 capsule 0   buPROPion (WELLBUTRIN XL) 150 MG 24 hr tablet TAKE 2 TABLETS BY MOUTH EVERY DAY 180 tablet 0   citalopram (CELEXA) 40 MG tablet TAKE 1 TABLET BY MOUTH EVERY DAY. NEED APPT FOR REFILLS 90 tablet 1   [place] Liver Oil CAPS Take 1 capsule by  mouth daily.     furosemide (LASIX) 20 MG tablet TAKE 1 TABLET BY MOUTH EVERY DAY (Patient taking differently: Take 20 mg by mouth as needed.) 90 tablet 1   hydrOXYzine (ATARAX) 10 MG tablet TAKE 1 TABLET (10 MG TOTAL) BY MOUTH 3 (THREE) TIMES DAILY AS NEEDED FOR ANXIETY. 30 tablet 0   Magnesium Citrate 100 MG CAPS Take 1 capsule by mouth daily.     methocarbamol (ROBAXIN) 500 MG tablet Take 500 mg by mouth 3 (three) times daily.     OVER THE COUNTER MEDICATION Take 1 capsule by mouth daily. Recuperate IQ     OVER THE COUNTER MEDICATION Take 1 capsule by mouth daily. Gut support     pantoprazole (PROTONIX) 20 MG tablet Take 1 tablet (20 mg total) by mouth daily. 90 tablet 3   Probiotic Product (SUPER PROBIOTIC DIGESTIVE) CAPS Take 1 capsule by mouth daily.     Rosemary Oil OIL 1 drop by Does not apply route daily. With [place], [place], [place], Glycerine and [employer] Ethanol     Vitamin D, Ergocalciferol, (DRISDOL) 1.25 MG (50000 UNIT) CAPS capsule Take 1 capsule (50,000 Units total) by mouth every 7 (seven) days. 12 capsule 0   No current facility-administered medications on file prior to visit.     ROS see history of present illness  Objective  Physical Exam Vitals:   02/07/23 1336  BP: 124/82  Pulse: 79  Temp: 98.7 F (37.1 C)  SpO2: 97%    BP Readings from Last 3 Encounters:  02/07/23 124/82  10/07/22 135/68  09/16/22 (!) 155/77   Wt Readings from Last 3 Encounters:  02/07/23 216 lb (98 kg)  10/07/22 218 lb (98.9 kg)  09/16/22 217 lb (98.4 kg)    Physical Exam Constitutional:      General: She is not in acute distress.    Appearance: She is not diaphoretic.  Cardiovascular:     Rate and Rhythm: Normal rate and regular rhythm.     Heart sounds: Normal heart sounds.  Pulmonary:     Effort: Pulmonary effort is normal.     Breath sounds: Normal breath sounds.  Musculoskeletal:     Comments: Some discomfort over palpation of the right greater trochanter  Skin:     General: Skin is warm and dry.  Neurological:     Mental Status: She is alert.    Mini cog test 5/5  Assessment/Plan: Please see individual problem list.  Depression with anxiety Assessment & Plan: Chronic issue. Seems to be adequately controlled.  She will continue Wellbutrin 300 mg daily and Celexa 40 mg daily.   Vitamin D deficiency Assessment & Plan: Check vitamin D today.  Orders: -     VITAMIN D 25 Hydroxy (Vit-D Deficiency, Fractures)  Class 2 obesity due to excess calories with body mass index (BMI) of 35.0 to 35.9 in adult, unspecified whether serious comorbidity present -     Hemoglobin A1c  Lipid screening -     Comprehensive metabolic panel -     Lipid panel  Trochanteric bursitis of both hips Assessment & Plan: Chronic issue.  I will refill meloxicam 15 mg daily as needed for pain.  Discussed she should try to minimize use of this if possible and only take it when needed.  Discussed if she requires this on a daily basis we will need to monitor her kidney function.  Discussed risk of GI issues, kidney issues, and cardiac issues with this kind of medication.  Advised to take with food.   Lightheadedness Assessment & Plan: Suspect related to orthostasis.  I encouraged her to increase her water intake back to 5 glasses/day.   Memory difficulty Assessment & Plan: Patient passed mini cog testing.  We will monitor her memory.   Other orders -     Meloxicam; Take 1 tablet (15 mg total) by mouth daily as needed for pain.  Dispense: 30 tablet; Refill: 0     Return in about 6 months (around 08/10/2023).   Marikay Alar, MD [facility]

## 2023-02-07 NOTE — Assessment & Plan Note (Signed)
Patient passed mini cog testing.  We will monitor her memory.

## 2023-02-07 NOTE — Progress Notes (Signed)
Chronic medical

## 2023-02-07 NOTE — Assessment & Plan Note (Signed)
Chronic issue.  I will refill meloxicam 15 mg daily as needed for pain.  Discussed she should try to minimize use of this if possible and only take it when needed.  Discussed if she requires this on a daily basis we will need to monitor her kidney function.  Discussed risk of GI issues, kidney issues, and cardiac issues with this kind of medication.  Advised to take with food.

## 2023-02-07 NOTE — Assessment & Plan Note (Signed)
Check vitamin D today. 

## 2023-02-07 NOTE — Assessment & Plan Note (Signed)
Chronic issue. Seems to be adequately controlled.  She will continue Wellbutrin 300 mg daily and Celexa 40 mg daily.

## 2023-02-08 LAB — LIPID PANEL
Chol/HDL Ratio: 3.2 ratio (ref 0.0–4.4)
Cholesterol, Total: 216 mg/dL — ABNORMAL HIGH (ref 100–199)
HDL: 67 mg/dL (ref >39)
LDL Chol Calc (NIH): 118 mg/dL — ABNORMAL HIGH (ref 0–99)
Triglycerides: 182 mg/dL — ABNORMAL HIGH (ref 0–149)
VLDL Cholesterol Cal: 31 mg/dL (ref 5–40)

## 2023-02-08 LAB — VITAMIN D 25 HYDROXY (VIT D DEFICIENCY, FRACTURES): Vit D, 25-Hydroxy: 28.1 ng/mL — ABNORMAL LOW (ref 30.0–100.0)

## 2023-02-08 LAB — COMPREHENSIVE METABOLIC PANEL
ALT: 12 IU/L (ref 0–32)
AST: 13 IU/L (ref 0–40)
Albumin: 4.2 g/dL (ref 3.9–4.9)
Alkaline Phosphatase: 74 IU/L (ref 44–121)
BUN/Creatinine Ratio: 14 (ref 12–28)
BUN: 18 mg/dL (ref 8–27)
Bilirubin Total: 1 mg/dL (ref 0.0–1.2)
CO2: 24 mmol/L (ref 20–29)
Calcium: 9.8 mg/dL (ref 8.7–10.3)
Chloride: 105 mmol/L (ref 96–106)
Creatinine, Ser: 1.32 mg/dL — ABNORMAL HIGH (ref 0.57–1.00)
Globulin, Total: 2.5 g/dL (ref 1.5–4.5)
Glucose: 85 mg/dL (ref 70–99)
Potassium: 4.4 mmol/L (ref 3.5–5.2)
Sodium: 143 mmol/L (ref 134–144)
Total Protein: 6.7 g/dL (ref 6.0–8.5)
eGFR: 43 mL/min/{1.73_m2} — ABNORMAL LOW (ref >59)

## 2023-02-08 LAB — HEMOGLOBIN A1C
Est. average glucose Bld gHb Est-mCnc: 120 mg/dL
Hgb A1c MFr Bld: 5.8 % — ABNORMAL HIGH (ref 4.8–5.6)

## 2023-02-12 ENCOUNTER — Other Ambulatory Visit: Payer: Self-pay | Admitting: Family Medicine

## 2023-02-12 MED ORDER — ROSUVASTATIN CALCIUM 20 MG PO TABS: 20.0000 mg | ORAL_TABLET | Freq: Every day | ORAL | 3 refills | Status: DC

## 2023-02-12 NOTE — Addendum Note (Signed)
Addended by: Prince Solian A on: 02/12/2023 09:43 AM   Modules accepted: Orders

## 2023-02-12 NOTE — Addendum Note (Signed)
Addended by: Birdie Sons, Caroleann Casler G on: 02/12/2023 12:06 PM   Modules accepted: Orders

## 2023-03-11 ENCOUNTER — Encounter: Payer: Self-pay | Admitting: Family Medicine

## 2023-03-20 DIAGNOSIS — R040 Epistaxis: Secondary | ICD-10-CM | POA: Diagnosis not present

## 2023-03-20 DIAGNOSIS — K219 Gastro-esophageal reflux disease without esophagitis: Secondary | ICD-10-CM | POA: Diagnosis not present

## 2023-03-20 DIAGNOSIS — F419 Anxiety disorder, unspecified: Secondary | ICD-10-CM | POA: Diagnosis not present

## 2023-03-26 ENCOUNTER — Other Ambulatory Visit: Payer: PPO

## 2023-04-02 ENCOUNTER — Ambulatory Visit (INDEPENDENT_AMBULATORY_CARE_PROVIDER_SITE_OTHER): Payer: PPO | Admitting: Emergency Medicine

## 2023-04-02 VITALS — Ht 65.0 in | Wt 215.0 lb

## 2023-04-02 DIAGNOSIS — Z0000 Encounter for general adult medical examination without abnormal findings: Secondary | ICD-10-CM | POA: Diagnosis not present

## 2023-04-02 NOTE — Progress Notes (Signed)
Subjective:   Michele Long is a 72 y.o. female who presents for Medicare Annual (Subsequent) preventive examination.  Visit Complete: Virtual  I connected with  Michele Long on 04/02/23 by a audio enabled telemedicine application and verified that I am speaking with the correct person using two identifiers.  Patient Location: Home  Provider Location: Home Office  I discussed the limitations of evaluation and management by telemedicine. The patient expressed understanding and agreed to proceed.  Vital Signs: Because this visit was a virtual/telehealth visit, some criteria may be missing or patient reported. Any vitals not documented were not able to be obtained and vitals that have been documented are patient reported.    Review of Systems     Cardiac Risk Factors include: advanced age (>40men, >15 women);obesity (BMI >30kg/m2);dyslipidemia     Objective:    Today's Vitals   04/02/23 1415  Weight: 215 lb (97.5 kg)  Height: 5\' 5"  (1.651 m)   Body mass index is 35.78 kg/m.     04/02/2023    2:34 PM 10/07/2022   11:49 AM 07/08/2022    9:41 AM 03/27/2022   10:17 AM 11/16/2021    5:27 PM 06/02/2021    2:41 PM 02/13/2021   11:29 AM  Advanced Directives  Does Patient Have a Medical Advance Directive? No No No No No No No  Would patient like information on creating a medical advance directive? Yes (MAU/Ambulatory/Procedural Areas - Information given)   No - Patient declined No - Patient declined  No - Patient declined    Current Medications (verified) Outpatient Encounter Medications as of 04/02/2023  Medication Sig   buPROPion (WELLBUTRIN XL) 150 MG 24 hr tablet TAKE 2 TABLETS BY MOUTH EVERY DAY   citalopram (CELEXA) 40 MG tablet TAKE 1 TABLET BY MOUTH EVERY DAY. NEED APPT FOR REFILLS   furosemide (LASIX) 20 MG tablet TAKE 1 TABLET BY MOUTH EVERY DAY (Patient taking differently: Take 20 mg by mouth as needed.)   Magnesium Citrate 100 MG CAPS Take 1 capsule by mouth daily.    pantoprazole (PROTONIX) 20 MG tablet Take 1 tablet (20 mg total) by mouth daily.   Vitamin D, Ergocalciferol, (DRISDOL) 1.25 MG (50000 UNIT) CAPS capsule Take 1 capsule (50,000 Units total) by mouth every 7 (seven) days.   benzonatate (TESSALON) 100 MG capsule Take 1 capsule (100 mg total) by mouth 3 (three) times daily as needed for cough. (Patient not taking: Reported on 04/02/2023)   Cod Liver Oil CAPS Take 1 capsule by mouth daily. (Patient not taking: Reported on 04/02/2023)   hydrOXYzine (ATARAX) 10 MG tablet TAKE 1 TABLET (10 MG TOTAL) BY MOUTH 3 (THREE) TIMES DAILY AS NEEDED FOR ANXIETY. (Patient not taking: Reported on 04/02/2023)   meloxicam (MOBIC) 15 MG tablet Take 1 tablet (15 mg total) by mouth daily as needed for pain. (Patient not taking: Reported on 04/02/2023)   methocarbamol (ROBAXIN) 500 MG tablet Take 500 mg by mouth 3 (three) times daily. (Patient not taking: Reported on 04/02/2023)   OVER THE COUNTER MEDICATION Take 1 capsule by mouth daily. Recuperate IQ (Patient not taking: Reported on 04/02/2023)   OVER THE COUNTER MEDICATION Take 1 capsule by mouth daily. Gut support (Patient not taking: Reported on 04/02/2023)   Probiotic Product (SUPER PROBIOTIC DIGESTIVE) CAPS Take 1 capsule by mouth daily. (Patient not taking: Reported on 04/02/2023)   Rosemary Oil OIL 1 drop by Does not apply route daily. With Atlantic, [place], [place], Glycerine and Organic Cane Ethanol (Patient not  taking: Reported on 04/02/2023)   rosuvastatin (CRESTOR) 20 MG tablet Take 1 tablet (20 mg total) by mouth daily. (Patient not taking: Reported on 04/02/2023)   No facility-administered encounter medications on file as of 04/02/2023.    Allergies (verified) Erythromycin, Misc. sulfonamide containing compounds, and Sulfa antibiotics   History: Past Medical History:  Diagnosis Date   Allergy    Cataracts, bilateral    Depression    DVT (deep venous thrombosis) (HCC) 2010   Bilateral lower legs   Edema    Frequent  headaches    Stress - 3-4x/yr   Gastric ulcer 10/27/2015   GERD (gastroesophageal reflux disease)    HTN (hypertension)    Microalbuminuria    Migraine    None for several years   Monocular diplopia of right eye 10/22/2016   Shingles    Symptomatic menopausal or female climacteric states    UTI (lower urinary tract infection)    Wears hearing aid in both ears    Past Surgical History:  Procedure Laterality Date   AUGMENTATION MAMMAPLASTY Bilateral 1983   BREAST ENHANCEMENT SURGERY  1983   CATARACT EXTRACTION W/PHACO Right 09/16/2022   Procedure: CATARACT EXTRACTION PHACO AND INTRAOCULAR LENS PLACEMENT (IOC) RIGHT  clareon panoptic lens  3.61  00:26.9;  Surgeon: Nevada Crane, MD;  Location: [facility];  Service: Ophthalmology;  Laterality: Right;   CATARACT EXTRACTION W/PHACO Left 10/07/2022   Procedure: CATARACT EXTRACTION PHACO AND INTRAOCULAR LENS PLACEMENT (IOC) LEFT  clarion panoptic lens  3.81  00:40.9;  Surgeon: Nevada Crane, MD;  Location[facility];  Service: Ophthalmology;  Laterality: Left;   COLONOSCOPY WITH PROPOFOL N/A 07/08/2022   Procedure: COLONOSCOPY WITH PROPOFOL;  Surgeon: Wyline Mood, MD;  Location: [facility];  Service: Gastroenterology;  Laterality: N/A;   ESOPHAGOGASTRODUODENOSCOPY N/A 07/08/2022   Procedure: ESOPHAGOGASTRODUODENOSCOPY (EGD);  Surgeon: Wyline Mood, MD;  Location: [facility];  Service: Gastroenterology;  Laterality: N/A;   NASAL SEPTUM SURGERY  2000   Family History  Problem Relation Age of Onset   Arthritis Mother    Hypertension Mother    Hypertension Father    Osteoarthritis Father    Migraines Father    Diabetes Brother    Cancer Maternal Grandmother    Diabetes Maternal Grandfather    Heart disease Maternal Grandfather    Arthritis Paternal Grandmother    Cancer Paternal Grandfather        Prostate   Pancreatic cancer Paternal Grandfather    Social History   Socioeconomic History   Marital  status: Widowed    Spouse name: Not on file   Number of children: 2   Years of education: Not on file   Highest education level: Not on file  Occupational History   Occupation: retired    Comment: banking  Tobacco Use   Smoking status: Former    Current packs/day: 0.00    Average packs/day: 0.3 packs/day for 1 year (0.3 ttl pk-yrs)    Types: Cigarettes    Start date: 66    Quit date: 1972    Years since quitting: 52.7   Smokeless tobacco: Never   Tobacco comments:    Smoked for 10 months in college ~4 cigarettes per day  Vaping Use   Vaping status: Never Used  Substance and Sexual Activity   Alcohol use: No    Alcohol/week: 0.0 standard drinks of alcohol   Drug use: No   Sexual activity: Never  Other Topics Concern   Not on file  Social History Narrative   2 Children (1 daughter 36) lives with patient and 1 son passed away at 7 months    2 dogs    Caffeine 1 cup coffee   Widower    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    Social Determinants of Health   Financial Resource Strain: Low Risk  (04/02/2023)   Overall Financial Resource Strain (CARDIA)    Difficulty of Paying Living Expenses: Not hard at all  Food Insecurity: No Food Insecurity (04/02/2023)   Hunger Vital Sign    Worried About Running Out of Food in the Last Year: Never true    Ran Out of Food in the Last Year: Never true  Transportation Needs: No Transportation Needs (04/02/2023)   PRAPARE - Administrator, Civil Service (Medical): No    Lack of Transportation (Non-Medical): No  Physical Activity: Insufficiently Active (04/02/2023)   Exercise Vital Sign    Days of Exercise per Week: 4 days    Minutes of Exercise per Session: 30 min  Stress: No Stress Concern Present (04/02/2023)   [employer] of Occupational Health - Occupational Stress Questionnaire    Feeling of Stress : Only a little  Social Connections: Socially Isolated (04/02/2023)   Social Connection and Isolation Panel [NHANES]     Frequency of Communication with Friends and Family: More than three times a week    Frequency of Social Gatherings with Friends and Family: Once a week    Attends Religious Services: Never    Database administrator or Organizations: No    Attends Banker Meetings: Never    Marital Status: Widowed    Tobacco Counseling Counseling given: Not Answered Tobacco comments: Smoked for 10 months in college ~4 cigarettes per day   Clinical Intake:  Pre-visit preparation completed: Yes  Pain : No/denies pain     BMI - recorded: 35.78 Nutritional Status: BMI > 30  Obese Nutritional Risks: None Diabetes: No  How often do you need to have someone help you when you read instructions, pamphlets, or other written materials from your doctor or pharmacy?: 1 - Never  Interpreter Needed?: No  Information entered by :: Tora Kindred, CMA   Activities of Daily Living    04/02/2023    2:17 PM  In your present state of health, do you have any difficulty performing the following activities:  Hearing? 1  Comment wears hearing aids  Vision? 0  Difficulty concentrating or making decisions? 0  Walking or climbing stairs? 0  Dressing or bathing? 0  Doing errands, shopping? 0  Preparing Food and eating ? N  Using the Toilet? N  In the past six months, have you accidently leaked urine? N  Do you have problems with loss of bowel control? N  Managing your Medications? N  Managing your Finances? N  Housekeeping or managing your Housekeeping? N    Patient Care Team: Glori Luis, MD as PCP - General (Family Medicine)  Indicate any recent Medical Services you may have received from other than [facility] providers in the past year (date may be approximate).     Assessment:   This is a routine wellness examination for [place].  Hearing/Vision screen Hearing Screening - Comments:: Wears hearing aids Vision Screening - Comments:: Gets routine eye exam  Dietary issues and exercise  activities discussed:     Goals Addressed               This Visit's Progress  Weight (lb) < 200 lb (90.7 kg) (pt-stated)   215 lb (97.5 kg)     Depression Screen    04/02/2023    2:30 PM 02/07/2023    2:02 PM 03/27/2022   10:14 AM 02/06/2022    9:13 AM 02/13/2021   11:26 AM 03/31/2020    3:41 PM 02/11/2020   11:49 AM  PHQ 2/9 Scores  PHQ - 2 Score 2 1 0 0 0 4 0  PHQ- 9 Score 3 7    17      Fall Risk    04/02/2023    2:36 PM 02/07/2023    2:02 PM 03/27/2022   10:19 AM 02/11/2020   11:49 AM 02/10/2019   12:13 PM  Fall Risk   Falls in the past year? 1 1 0 0 0  Number falls in past yr: 0 0 0 0   Injury with Fall? 1 1 0    Risk for fall due to : History of fall(s) History of fall(s)     Follow up Falls prevention discussed;Falls evaluation completed;Education provided Falls evaluation completed Falls evaluation completed Falls evaluation completed     MEDICARE RISK AT HOME: Medicare Risk at Home Any stairs in or around the home?: Yes If so, are there any without handrails?: No Home free of loose throw rugs in walkways, pet beds, electrical cords, etc?: Yes Adequate lighting in your home to reduce risk of falls?: Yes Life alert?: No Use of a cane, walker or w/c?: No Grab bars in the bathroom?: Yes Shower chair or bench in shower?: Yes Elevated toilet seat or a handicapped toilet?: Yes  TIMED UP AND GO:  Was the test performed?  No    Cognitive Function:        04/02/2023    2:37 PM 03/27/2022   10:32 AM 02/11/2020   11:57 AM 02/10/2019   12:16 PM  6CIT Screen  What Year? 0 points 0 points 0 points 0 points  What month? 0 points 0 points 0 points 0 points  What time? 0 points 0 points 0 points 0 points  Count back from 20 0 points 0 points 0 points 0 points  Months in reverse 2 points 0 points 0 points 0 points  Repeat phrase 0 points 0 points 0 points 0 points  Total Score 2 points 0 points 0 points 0 points    Immunizations Immunization History  Administered  Date(s) Administered   Influenza, High Dose Seasonal PF 06/06/2017, 04/22/2018   Influenza,inj,Quad PF,6+ Mos 06/12/2016   PFIZER(Purple Top)SARS-COV-2 Vaccination 04/26/2020, 06/05/2020   Pneumococcal Conjugate-13 05/14/2018   Tdap 06/02/2021    TDAP status: Up to date  Flu Vaccine status: Declined, Education has been provided regarding the importance of this vaccine but patient still declined. Advised may receive this vaccine at local pharmacy or Health Dept. Aware to provide a copy of the vaccination record if obtained from local pharmacy or Health Dept. Verbalized acceptance and understanding.  Pneumococcal vaccine status: Due, Education has been provided regarding the importance of this vaccine. Advised may receive this vaccine at local pharmacy or Health Dept. Aware to provide a copy of the vaccination record if obtained from local pharmacy or Health Dept. Verbalized acceptance and understanding.  Covid-19 vaccine status: Declined, Education has been provided regarding the importance of this vaccine but patient still declined. Advised may receive this vaccine at local pharmacy or Health Dept.or vaccine clinic. Aware to provide a copy of the vaccination record if obtained from local pharmacy  or Health Dept. Verbalized acceptance and understanding.  Qualifies for Shingles Vaccine? Yes   Zostavax completed No   Shingrix Completed?: No.    Education has been provided regarding the importance of this vaccine. Patient has been advised to call insurance company to determine out of pocket expense if they have not yet received this vaccine. Advised may also receive vaccine at local pharmacy or Health Dept. Verbalized acceptance and understanding.  Screening Tests Health Maintenance  Topic Date Due   Zoster Vaccines- Shingrix (1 of 2) Never done   DEXA SCAN  Never done   MAMMOGRAM  10/15/2018   Pneumonia Vaccine 70+ Years old (2 of 2 - PPSV23 or PCV20) 05/15/2019   INFLUENZA VACCINE   02/27/2023   COVID-19 Vaccine (3 - 2023-24 season) 03/30/2023   Medicare Annual Wellness (AWV)  04/01/2024   Colonoscopy  07/08/2025   DTaP/Tdap/Td (2 - Td or Tdap) 06/03/2031   Hepatitis C Screening  Completed   HPV VACCINES  Aged Out    Health Maintenance  Health Maintenance Due  Topic Date Due   Zoster Vaccines- Shingrix (1 of 2) Never done   DEXA SCAN  Never done   MAMMOGRAM  10/15/2018   Pneumonia Vaccine 54+ Years old (2 of 2 - PPSV23 or PCV20) 05/15/2019   INFLUENZA VACCINE  02/27/2023   COVID-19 Vaccine (3 - 2023-24 season) 03/30/2023    Colorectal cancer screening: Type of screening: Colonoscopy. Completed 07/08/22. Repeat every 3 years  Mammogram status: Ordered 10/28/22. Pt provided with contact info and advised to call to schedule appt.   Bone Density Status: patient declined  Lung Cancer Screening: (Low Dose CT Chest recommended if Age 52-80 years, 20 pack-year currently smoking OR have quit w/in 15years.) does not qualify.   Lung Cancer Screening Referral: n/a  Additional Screening:  Hepatitis C Screening: does not qualify; Completed 04/22/18  Vision Screening: Recommended annual ophthalmology exams for early detection of glaucoma and other disorders of the eye. I Dental Screening: Recommended annual dental exams for proper oral hygiene   Community Resource Referral / Chronic Care Management: CRR required this visit?  No   CCM required this visit?  No     Plan:     I have personally reviewed and noted the following in the patient's chart:   Medical and social history Use of alcohol, tobacco or illicit drugs  Current medications and supplements including opioid prescriptions. Patient is not currently taking opioid prescriptions. Functional ability and status Nutritional status Physical activity Advanced directives List of other physicians Hospitalizations, surgeries, and ER visits in previous 12 months Vitals Screenings to include cognitive,  depression, and falls Referrals and appointments  In addition, I have reviewed and discussed with patient certain preventive protocols, quality metrics, and best practice recommendations. A written personalized care plan for preventive services as well as general preventive health recommendations were provided to patient.     Tora Kindred, CMA   04/02/2023   After Visit Summary: (MyChart) Due to this being a telephonic visit, the after visit summary with patients personalized plan was offered to patient via MyChart   Nurse Notes:  6 CIT Score - 2 Needs pneumonia vaccine Declined shingles, covid and flu vaccines Declined MMG (ordered 10/28/22) and DEXA scan

## 2023-04-02 NOTE — Patient Instructions (Addendum)
Michele Long , Thank you for taking time to come for your Medicare Wellness Visit. I appreciate your ongoing commitment to your health goals. Please review the following plan we discussed and let me know if I can assist you in the future.   Referrals/Orders/Follow-Ups/Clinician Recommendations: Get another pneumonia vaccine at your earliest convenience. Recommend getting a mammogram. Call [facility] @ [phone] to schedule.  This is a list of the screening recommended for you and due dates:  Health Maintenance  Topic Date Due   Zoster (Shingles) Vaccine (1 of 2) Never done   DEXA scan (bone density measurement)  Never done   Mammogram  10/15/2018   Pneumonia Vaccine (2 of 2 - PPSV23 or PCV20) 05/15/2019   Flu Shot  02/27/2023   COVID-19 Vaccine (3 - 2023-24 season) 03/30/2023   Medicare Annual Wellness Visit  04/01/2024   Colon Cancer Screening  07/08/2025   DTaP/Tdap/Td vaccine (2 - Td or Tdap) 06/03/2031   Hepatitis C Screening  Completed   HPV Vaccine  Aged Out    Advanced directives: (ACP Link)Information on Advanced Care Planning can be found at [facility] Advance Health Care Directives Advance Health Care Directives ([email or web address]   Next Medicare Annual Wellness Visit scheduled for next year: Yes, 04/07/24 @ 2:15pm

## 2023-04-25 ENCOUNTER — Other Ambulatory Visit: Payer: PPO

## 2023-05-02 ENCOUNTER — Other Ambulatory Visit: Payer: Self-pay | Admitting: Family Medicine

## 2023-05-02 ENCOUNTER — Encounter: Payer: Self-pay | Admitting: Family Medicine

## 2023-05-02 ENCOUNTER — Other Ambulatory Visit (INDEPENDENT_AMBULATORY_CARE_PROVIDER_SITE_OTHER): Payer: PPO

## 2023-05-02 DIAGNOSIS — Z6835 Body mass index (BMI) 35.0-35.9, adult: Secondary | ICD-10-CM | POA: Diagnosis not present

## 2023-05-02 DIAGNOSIS — E785 Hyperlipidemia, unspecified: Secondary | ICD-10-CM

## 2023-05-02 DIAGNOSIS — E66812 Obesity, class 2: Secondary | ICD-10-CM

## 2023-05-02 DIAGNOSIS — E559 Vitamin D deficiency, unspecified: Secondary | ICD-10-CM | POA: Diagnosis not present

## 2023-05-02 DIAGNOSIS — E6609 Other obesity due to excess calories: Secondary | ICD-10-CM

## 2023-05-02 LAB — VITAMIN D 25 HYDROXY (VIT D DEFICIENCY, FRACTURES): VITD: 26.96 ng/mL — ABNORMAL LOW (ref 30.00–100.00)

## 2023-05-02 LAB — LIPID PANEL
Cholesterol: 183 mg/dL (ref 0–200)
HDL: 66.8 mg/dL (ref >39.00)
LDL Cholesterol: 99 mg/dL (ref 0–99)
NonHDL: 116.4
Total CHOL/HDL Ratio: 3
Triglycerides: 88 mg/dL (ref 0.0–149.0)
VLDL: 17.6 mg/dL (ref 0.0–40.0)

## 2023-05-02 LAB — COMPREHENSIVE METABOLIC PANEL
ALT: 12 U/L (ref 0–35)
AST: 17 U/L (ref 0–37)
Albumin: 4.1 g/dL (ref 3.5–5.2)
Alkaline Phosphatase: 62 U/L (ref 39–117)
BUN: 17 mg/dL (ref 6–23)
CO2: 28 meq/L (ref 19–32)
Calcium: 9.4 mg/dL (ref 8.4–10.5)
Chloride: 106 meq/L (ref 96–112)
Creatinine, Ser: 1.32 mg/dL — ABNORMAL HIGH (ref 0.40–1.20)
GFR: 40.77 mL/min — ABNORMAL LOW (ref >60.00)
Glucose, Bld: 90 mg/dL (ref 70–99)
Potassium: 4.5 meq/L (ref 3.5–5.1)
Sodium: 142 meq/L (ref 135–145)
Total Bilirubin: 1.3 mg/dL — ABNORMAL HIGH (ref 0.2–1.2)
Total Protein: 6.4 g/dL (ref 6.0–8.3)

## 2023-05-07 ENCOUNTER — Other Ambulatory Visit: Payer: Self-pay | Admitting: Family Medicine

## 2023-05-07 DIAGNOSIS — N1832 Chronic kidney disease, stage 3b: Secondary | ICD-10-CM

## 2023-05-07 DIAGNOSIS — E559 Vitamin D deficiency, unspecified: Secondary | ICD-10-CM

## 2023-05-08 ENCOUNTER — Other Ambulatory Visit: Payer: PPO

## 2023-05-12 ENCOUNTER — Other Ambulatory Visit: Payer: PPO

## 2023-05-14 ENCOUNTER — Other Ambulatory Visit: Payer: PPO

## 2023-05-14 DIAGNOSIS — N1832 Chronic kidney disease, stage 3b: Secondary | ICD-10-CM | POA: Diagnosis not present

## 2023-05-15 LAB — PROTEIN / CREATININE RATIO, URINE
Creatinine, Urine: 264 mg/dL (ref 20–275)
Protein/Creat Ratio: 61 mg/g{creat} (ref 24–184)
Protein/Creatinine Ratio: 0.061 mg/mg{creat} (ref 0.024–0.184)
Total Protein, Urine: 16 mg/dL (ref 5–24)

## 2023-05-21 ENCOUNTER — Ambulatory Visit
Admission: RE | Admit: 2023-05-21 | Discharge: 2023-05-21 | Disposition: A | Discharge status: Home / Self Care | Payer: PPO | Source: Ambulatory Visit | Attending: Family Medicine | Admitting: Family Medicine

## 2023-05-21 DIAGNOSIS — N189 Chronic kidney disease, unspecified: Secondary | ICD-10-CM | POA: Diagnosis not present

## 2023-05-21 DIAGNOSIS — N1832 Chronic kidney disease, stage 3b: Secondary | ICD-10-CM | POA: Diagnosis not present

## 2023-07-02 ENCOUNTER — Encounter: Payer: Self-pay | Admitting: Nurse Practitioner

## 2023-07-02 ENCOUNTER — Ambulatory Visit: Payer: PPO | Admitting: Nurse Practitioner

## 2023-07-02 VITALS — BP 138/72 | HR 82 | Temp 98.1°F | Ht 64.0 in | Wt 217.2 lb

## 2023-07-02 DIAGNOSIS — N1832 Chronic kidney disease, stage 3b: Secondary | ICD-10-CM

## 2023-07-02 DIAGNOSIS — F418 Other specified anxiety disorders: Secondary | ICD-10-CM

## 2023-07-02 DIAGNOSIS — M7061 Trochanteric bursitis, right hip: Secondary | ICD-10-CM | POA: Diagnosis not present

## 2023-07-02 DIAGNOSIS — E785 Hyperlipidemia, unspecified: Secondary | ICD-10-CM

## 2023-07-02 DIAGNOSIS — M7062 Trochanteric bursitis, left hip: Secondary | ICD-10-CM | POA: Diagnosis not present

## 2023-07-02 DIAGNOSIS — E559 Vitamin D deficiency, unspecified: Secondary | ICD-10-CM | POA: Diagnosis not present

## 2023-07-02 DIAGNOSIS — R030 Elevated blood-pressure reading, without diagnosis of hypertension: Secondary | ICD-10-CM

## 2023-07-02 MED ORDER — SERTRALINE HCL 50 MG PO TABS: 50.0000 mg | ORAL_TABLET | Freq: Every day | ORAL | 0 refills | Status: DC

## 2023-07-02 NOTE — Assessment & Plan Note (Signed)
They are experiencing significant pain and currently use Meloxicam as needed, though they report requiring it daily. We discussed the risks of NSAIDs on kidney function, especially given their history of decreased kidney function. We will refer them to Orthopedics for potential hip injections.

## 2023-07-02 NOTE — Assessment & Plan Note (Signed)
Renal US on 05/21/23 revealed no cause for decline in kidney function. We will check CMP today and if persisting will refer to Nephrology for further evaluation. Discussed decreased use of NSAIDs and adequate hydration.

## 2023-07-02 NOTE — Assessment & Plan Note (Signed)
They have not started Crestor and requested a recheck of cholesterol levels before starting the medication. We will recheck cholesterol levels and discuss the results and potential need for Crestor. The 10-year ASCVD risk score (Arnett DK, et al., 2019) is: 11.5%.

## 2023-07-02 NOTE — Assessment & Plan Note (Signed)
No prior history of hypertension. BP at last visit was stable. We will continue to monitor blood pressure and consider starting on medication if remaining consistently elevated.

## 2023-07-02 NOTE — Assessment & Plan Note (Signed)
They are currently taking an over-the-counter Vitamin D supplement, 5000 units every other day. We will continue the current regimen and recheck Vitamin D levels today.

## 2023-07-02 NOTE — Progress Notes (Signed)
Bethanie Dicker, NP-C Phone: [phone]  Michele Long is a 72 y.o. female who presents today for transfer of care.   Discussed the use of AI scribe software for clinical note transcription with the patient, who gave verbal consent to proceed.  History of Present Illness   The patient, previously under the care of Dr. Birdie Sons, presents with a history of depression, anxiety, acid reflux, vitamin D deficiency, and hip bursitis. They express a need for additional pain management for their hip bursitis. They have been prescribed meloxicam, but were advised not to take it daily. Despite this, the patient reports that the pain is severe enough to warrant daily use. They confirm that meloxicam provides relief when taken. However, due to concerns about potential kidney damage from NSAIDs, the patient is seeking alternative pain management strategies. They have not previously seen a specialist for their hips or received injections for pain management, but are open to these options.  The patient is currently on Wellbutrin and Celexa for their mental health conditions. They express dissatisfaction with their current regimen, citing persistent anxiety and depressive episodes. They report increased irritability and frequent conflicts with their adult daughter, who lives with them. They are unsure if these issues are due to their current medications, their age, or situational stressors. They are open to changing their medication regimen, specifically considering a switch from Celexa to another medication.  The patient is also taking an over-the-counter vitamin D supplement at a dose of 5000 units every other day.   Lastly, the patient has been prescribed Crestor, but has not yet started taking it. They express a desire to have their cholesterol rechecked before starting the medication. They are aware of their moderate risk for cardiovascular disease and are considering lifestyle changes to manage this risk.  However, they acknowledge difficulties in implementing these changes.      Social History   Tobacco Use  Smoking Status Former   Current packs/day: 0.00   Average packs/day: 0.3 packs/day for 1 year (0.3 ttl pk-yrs)   Types: Cigarettes   Start date: 74   Quit date: 56   Years since quitting: 52.9  Smokeless Tobacco Never  Tobacco Comments   Smoked for 10 months in college ~4 cigarettes per day    Current Outpatient Medications on File Prior to Visit  Medication Sig Dispense Refill   buPROPion (WELLBUTRIN XL) 150 MG 24 hr tablet TAKE 2 TABLETS BY MOUTH EVERY DAY 180 tablet 0   furosemide (LASIX) 20 MG tablet TAKE 1 TABLET BY MOUTH EVERY DAY (Patient taking differently: Take 20 mg by mouth as needed.) 90 tablet 1   OVER THE COUNTER MEDICATION Take 1 capsule by mouth daily. Gut support (Patient not taking: Reported on 04/02/2023)     pantoprazole (PROTONIX) 20 MG tablet Take 1 tablet (20 mg total) by mouth daily. 90 tablet 3   rosuvastatin (CRESTOR) 20 MG tablet Take 1 tablet (20 mg total) by mouth daily. (Patient not taking: Reported on 04/02/2023) 90 tablet 3   No current facility-administered medications on file prior to visit.     ROS see history of present illness  Objective  Physical Exam Vitals:   07/02/23 1521  BP: 138/72  Pulse: 82  Temp: 98.1 F (36.7 C)  SpO2: 95%    BP Readings from Last 3 Encounters:  07/02/23 138/72  02/07/23 124/82  10/07/22 135/68   Wt Readings from Last 3 Encounters:  07/02/23 217 lb 3.2 oz (98.5 kg)  04/02/23 215 lb (97.5 kg)  02/07/23 216 lb (98 kg)    Physical Exam Constitutional:      General: She is not in acute distress.    Appearance: Normal appearance.  HENT:     Head: Normocephalic.  Cardiovascular:     Rate and Rhythm: Normal rate and regular rhythm.     Heart sounds: Normal heart sounds.  Pulmonary:     Effort: Pulmonary effort is normal.     Breath sounds: Normal breath sounds.  Skin:    General: Skin is  warm and dry.  Neurological:     General: No focal deficit present.     Mental Status: She is alert.  Psychiatric:        Mood and Affect: Mood normal.        Behavior: Behavior normal.    Assessment/Plan: Please see individual problem list.  Hyperlipidemia, unspecified hyperlipidemia type Assessment & Plan: They have not started Crestor and requested a recheck of cholesterol levels before starting the medication. We will recheck cholesterol levels and discuss the results and potential need for Crestor. The 10-year ASCVD risk score (Arnett DK, et al., 2019) is: 11.5%.   Orders: -     Lipid panel  CKD stage 3b, GFR 30-44 ml/min (HCC) Assessment & Plan: Renal US on 05/21/23 revealed no cause for decline in kidney function. We will check CMP today and if persisting will refer to Nephrology for further evaluation. Discussed decreased use of NSAIDs and adequate hydration.   Orders: -     Comprehensive metabolic panel  Vitamin D deficiency Assessment & Plan: They are currently taking an over-the-counter Vitamin D supplement, 5000 units every other day. We will continue the current regimen and recheck Vitamin D levels today.   Orders: -     VITAMIN D 25 Hydroxy (Vit-D Deficiency, Fractures)  Depression with anxiety Assessment & Plan: They report increased irritability and mood swings while on Wellbutrin 300mg  and Celexa. We will discontinue Celexa and start Zoloft 50 mg daily, planning to reassess in 4-6 weeks. Counseled patient on common side effects. Encouraged to contact if worsening symptoms, unusual behavior changes or suicidal thoughts occur. PHQ- 9 and GAD- 5 today.   Orders: -     Sertraline HCl; Take 1 tablet (50 mg total) by mouth daily.  Dispense: 90 tablet; Refill: 0  Trochanteric bursitis of both hips Assessment & Plan: They are experiencing significant pain and currently use Meloxicam as needed, though they report requiring it daily. We discussed the risks of NSAIDs on  kidney function, especially given their history of decreased kidney function. We will refer them to Orthopedics for potential hip injections.  Orders: -     Ambulatory referral to Orthopedics  Elevated BP without diagnosis of hypertension Assessment & Plan: No prior history of hypertension. BP at last visit was stable. We will continue to monitor blood pressure and consider starting on medication if remaining consistently elevated.     Return in about 6 weeks (around 08/13/2023) for Anxiety/Depression.   Bethanie Dicker, NP-C [facility] - [employer] [facility]

## 2023-07-02 NOTE — Assessment & Plan Note (Signed)
They report increased irritability and mood swings while on Wellbutrin 300mg  and Celexa. We will discontinue Celexa and start Zoloft 50 mg daily, planning to reassess in 4-6 weeks. Counseled patient on common side effects. Encouraged to contact if worsening symptoms, unusual behavior changes or suicidal thoughts occur. PHQ- 9 and GAD- 5 today.

## 2023-07-03 ENCOUNTER — Telehealth: Payer: Self-pay

## 2023-07-03 LAB — LIPID PANEL
Cholesterol: 193 mg/dL (ref 0–200)
HDL: 66.7 mg/dL (ref >39.00)
LDL Cholesterol: 107 mg/dL — ABNORMAL HIGH (ref 0–99)
NonHDL: 126.12
Total CHOL/HDL Ratio: 3
Triglycerides: 98 mg/dL (ref 0.0–149.0)
VLDL: 19.6 mg/dL (ref 0.0–40.0)

## 2023-07-03 LAB — COMPREHENSIVE METABOLIC PANEL
ALT: 14 U/L (ref 0–35)
AST: 18 U/L (ref 0–37)
Albumin: 4.2 g/dL (ref 3.5–5.2)
Alkaline Phosphatase: 56 U/L (ref 39–117)
BUN: 20 mg/dL (ref 6–23)
CO2: 29 meq/L (ref 19–32)
Calcium: 9.3 mg/dL (ref 8.4–10.5)
Chloride: 104 meq/L (ref 96–112)
Creatinine, Ser: 1.58 mg/dL — ABNORMAL HIGH (ref 0.40–1.20)
GFR: 32.82 mL/min — ABNORMAL LOW (ref >60.00)
Glucose, Bld: 111 mg/dL — ABNORMAL HIGH (ref 70–99)
Potassium: 3.7 meq/L (ref 3.5–5.1)
Sodium: 141 meq/L (ref 135–145)
Total Bilirubin: 1.1 mg/dL (ref 0.2–1.2)
Total Protein: 6.9 g/dL (ref 6.0–8.3)

## 2023-07-03 LAB — VITAMIN D 25 HYDROXY (VIT D DEFICIENCY, FRACTURES): VITD: 37.63 ng/mL (ref 30.00–100.00)

## 2023-07-03 NOTE — Telephone Encounter (Signed)
LVM to CB in regards to lab and question provider had

## 2023-07-03 NOTE — Telephone Encounter (Signed)
Patient called in returning call she received. 

## 2023-07-04 ENCOUNTER — Other Ambulatory Visit: Payer: Self-pay | Admitting: Nurse Practitioner

## 2023-07-04 DIAGNOSIS — N1832 Chronic kidney disease, stage 3b: Secondary | ICD-10-CM

## 2023-07-04 NOTE — Telephone Encounter (Signed)
Called pt back and documented info in lab tab and routed to provider

## 2023-07-09 ENCOUNTER — Encounter: Payer: Self-pay | Admitting: Nurse Practitioner

## 2023-07-14 DIAGNOSIS — M7061 Trochanteric bursitis, right hip: Secondary | ICD-10-CM | POA: Diagnosis not present

## 2023-07-14 DIAGNOSIS — M7062 Trochanteric bursitis, left hip: Secondary | ICD-10-CM | POA: Diagnosis not present

## 2023-07-16 DIAGNOSIS — M7062 Trochanteric bursitis, left hip: Secondary | ICD-10-CM | POA: Diagnosis not present

## 2023-07-16 DIAGNOSIS — M7061 Trochanteric bursitis, right hip: Secondary | ICD-10-CM | POA: Diagnosis not present

## 2023-07-18 DIAGNOSIS — M7061 Trochanteric bursitis, right hip: Secondary | ICD-10-CM | POA: Diagnosis not present

## 2023-07-18 DIAGNOSIS — M7062 Trochanteric bursitis, left hip: Secondary | ICD-10-CM | POA: Diagnosis not present

## 2023-07-21 DIAGNOSIS — M7061 Trochanteric bursitis, right hip: Secondary | ICD-10-CM | POA: Diagnosis not present

## 2023-07-21 DIAGNOSIS — M7062 Trochanteric bursitis, left hip: Secondary | ICD-10-CM | POA: Diagnosis not present

## 2023-07-25 DIAGNOSIS — M7061 Trochanteric bursitis, right hip: Secondary | ICD-10-CM | POA: Diagnosis not present

## 2023-07-25 DIAGNOSIS — M7062 Trochanteric bursitis, left hip: Secondary | ICD-10-CM | POA: Diagnosis not present

## 2023-08-03 ENCOUNTER — Other Ambulatory Visit: Payer: Self-pay | Admitting: Family Medicine

## 2023-08-03 ENCOUNTER — Other Ambulatory Visit: Payer: Self-pay | Admitting: Gastroenterology

## 2023-08-05 ENCOUNTER — Encounter: Payer: Self-pay | Admitting: Nurse Practitioner

## 2023-08-11 ENCOUNTER — Ambulatory Visit: Payer: PPO | Admitting: Family Medicine

## 2023-08-13 ENCOUNTER — Encounter: Payer: Self-pay | Admitting: Nurse Practitioner

## 2023-08-13 ENCOUNTER — Ambulatory Visit: Payer: PPO | Admitting: Nurse Practitioner

## 2023-08-13 VITALS — BP 124/82 | HR 94 | Temp 97.6°F | Ht 64.0 in | Wt 212.6 lb

## 2023-08-13 DIAGNOSIS — N1832 Chronic kidney disease, stage 3b: Secondary | ICD-10-CM

## 2023-08-13 DIAGNOSIS — F418 Other specified anxiety disorders: Secondary | ICD-10-CM

## 2023-08-13 DIAGNOSIS — E785 Hyperlipidemia, unspecified: Secondary | ICD-10-CM

## 2023-08-13 DIAGNOSIS — R413 Other amnesia: Secondary | ICD-10-CM | POA: Diagnosis not present

## 2023-08-13 LAB — COMPREHENSIVE METABOLIC PANEL
ALT: 20 U/L (ref 0–35)
AST: 20 U/L (ref 0–37)
Albumin: 4.2 g/dL (ref 3.5–5.2)
Alkaline Phosphatase: 53 U/L (ref 39–117)
BUN: 15 mg/dL (ref 6–23)
CO2: 27 meq/L (ref 19–32)
Calcium: 9.6 mg/dL (ref 8.4–10.5)
Chloride: 106 meq/L (ref 96–112)
Creatinine, Ser: 1.09 mg/dL (ref 0.40–1.20)
GFR: 51.2 mL/min — ABNORMAL LOW (ref >60.00)
Glucose, Bld: 87 mg/dL (ref 70–99)
Potassium: 3.9 meq/L (ref 3.5–5.1)
Sodium: 141 meq/L (ref 135–145)
Total Bilirubin: 1 mg/dL (ref 0.2–1.2)
Total Protein: 7 g/dL (ref 6.0–8.3)

## 2023-08-13 LAB — LIPID PANEL
Cholesterol: 165 mg/dL (ref 0–200)
HDL: 75.1 mg/dL
LDL Cholesterol: 67 mg/dL (ref 0–99)
NonHDL: 90.17
Total CHOL/HDL Ratio: 2
Triglycerides: 117 mg/dL (ref 0.0–149.0)
VLDL: 23.4 mg/dL (ref 0.0–40.0)

## 2023-08-13 MED ORDER — SERTRALINE HCL 50 MG PO TABS: 50.0000 mg | ORAL_TABLET | Freq: Every day | ORAL | 2 refills | Status: DC

## 2023-08-13 NOTE — Assessment & Plan Note (Addendum)
 Improvement observed since switching from Celexa  to Zoloft , alongside continued use of Wellbutrin . No suicidal ideation or worsening symptoms. Continue Zoloft  50mg  daily and Wellbutrin  XL 300mg  daily. Consider taking Zoloft  at bedtime to potentially improve sleep. Encouraged to contact if worsening symptoms, unusual behavior changes or suicidal thoughts occur.

## 2023-08-13 NOTE — Progress Notes (Signed)
 Bluford Burkitt, NP-C Phone: [phone]  Michele Long is a 73 y.o. female who presents today for follow up.   Discussed the use of AI scribe software for clinical note transcription with the patient, who gave verbal consent to proceed.  History of Present Illness   The patient, with a history of kidney issues, hip and knee pain, and mood disorders, presents for a follow-up visit approximately six weeks after her last appointment. She reports a significant improvement in mood since switching from Celexa  to Zoloft , with no thoughts of self-harm or harm to others. She also continues to take Wellbutrin . The patient has noticed a positive difference since starting Zoloft , which she takes in the morning, but reports feeling generally tired and experiencing poor sleep. She has been using Tylenol  PM to aid sleep but is considering switching Zoloft  to bedtime to see if it improves sleep quality.  The patient has stopped taking meloxicam , previously prescribed for hip and knee pain. She has been taking Crestor  for cholesterol management since her last visit and reports no new muscle aches or abdominal pains. She is due to see a nephrologist next week for her kidney issues.  The patient's companion expresses concern about the patient's memory, noting a progressive decline. The patient admits to frequently misplacing her phone and occasionally forgetting to turn off the oven, but denies any significant short-term memory loss or disorientation. Given the family history of dementia, the patient is open to a neurology referral for a baseline assessment and potential intervention to slow any cognitive decline.      Social History   Tobacco Use  Smoking Status Former   Current packs/day: 0.00   Average packs/day: 0.3 packs/day for 1 year (0.3 ttl pk-yrs)   Types: Cigarettes   Start date: 59   Quit date: 70   Years since quitting: 53.0  Smokeless Tobacco Never  Tobacco Comments   Smoked for 10 months in  college ~4 cigarettes per day    Current Outpatient Medications on File Prior to Visit  Medication Sig Dispense Refill   buPROPion  (WELLBUTRIN  XL) 150 MG 24 hr tablet TAKE 2 TABLETS BY MOUTH EVERY DAY 180 tablet 3   furosemide  (LASIX ) 20 MG tablet TAKE 1 TABLET BY MOUTH EVERY DAY (Patient taking differently: Take 20 mg by mouth as needed.) 90 tablet 1   pantoprazole  (PROTONIX ) 20 MG tablet Take 1 tablet (20 mg total) by mouth daily. 90 tablet 3   rosuvastatin  (CRESTOR ) 20 MG tablet Take 1 tablet (20 mg total) by mouth daily. (Patient not taking: Reported on 04/02/2023) 90 tablet 3   No current facility-administered medications on file prior to visit.    ROS see history of present illness  Objective  Physical Exam Vitals:   08/13/23 1331  BP: 124/82  Pulse: 94  Temp: 97.6 F (36.4 C)  SpO2: 93%    BP Readings from Last 3 Encounters:  08/13/23 124/82  07/02/23 138/72  02/07/23 124/82   Wt Readings from Last 3 Encounters:  08/13/23 212 lb 9.6 oz (96.4 kg)  07/02/23 217 lb 3.2 oz (98.5 kg)  04/02/23 215 lb (97.5 kg)    Physical Exam Constitutional:      General: She is not in acute distress.    Appearance: Normal appearance.  HENT:     Head: Normocephalic.  Cardiovascular:     Rate and Rhythm: Normal rate and regular rhythm.     Heart sounds: Normal heart sounds.  Pulmonary:     Effort: Pulmonary effort  is normal.     Breath sounds: Normal breath sounds.  Skin:    General: Skin is warm and dry.  Neurological:     General: No focal deficit present.     Mental Status: She is alert and oriented to person, place, and time.  Psychiatric:        Mood and Affect: Mood normal.        Behavior: Behavior normal.    Assessment/Plan: Please see individual problem list.  Memory difficulty Assessment & Plan: Family history of dementia with history of memory difficulty noted earlier this year, but no significant cognitive impairment. Refer to neurology for baseline  assessment and potential intervention.  Orders: -     Ambulatory referral to Neurology  Depression with anxiety Assessment & Plan: Improvement observed since switching from Celexa  to Zoloft , alongside continued use of Wellbutrin . No suicidal ideation or worsening symptoms. Continue Zoloft  50mg  daily and Wellbutrin  XL 300mg  daily. Consider taking Zoloft  at bedtime to potentially improve sleep. Encouraged to contact if worsening symptoms, unusual behavior changes or suicidal thoughts occur.   Orders: -     Sertraline  HCl; Take 1 tablet (50 mg total) by mouth daily.  Dispense: 90 tablet; Refill: 2  CKD stage 3b, GFR 30-44 ml/min [facility]) Assessment & Plan: Scheduled to see Nephrology on 08/20/2023. Encouraged to continue avoiding use of NSAIDs.   Orders: -     Comprehensive metabolic panel  Hyperlipidemia, unspecified hyperlipidemia type Assessment & Plan: Recently started on Crestor  20mg  daily with no adverse effects. Continue Crestor . Check cholesterol and CMP today.   Orders: -     Comprehensive metabolic panel -     Lipid panel   Return in about 6 months (around 02/10/2024) for Follow up.   Bluford Burkitt, NP-C [facility] - [facility]

## 2023-08-13 NOTE — Assessment & Plan Note (Signed)
 Family history of dementia with history of memory difficulty noted earlier this year, but no significant cognitive impairment. Refer to neurology for baseline assessment and potential intervention.

## 2023-08-13 NOTE — Assessment & Plan Note (Signed)
 Scheduled to see Nephrology on 08/20/2023. Encouraged to continue avoiding use of NSAIDs.

## 2023-08-13 NOTE — Assessment & Plan Note (Signed)
 Recently started on Crestor  20mg  daily with no adverse effects. Continue Crestor . Check cholesterol and CMP today.

## 2023-08-19 ENCOUNTER — Encounter: Payer: Self-pay | Admitting: Nurse Practitioner

## 2023-08-25 DIAGNOSIS — M7061 Trochanteric bursitis, right hip: Secondary | ICD-10-CM | POA: Diagnosis not present

## 2023-08-25 DIAGNOSIS — M7062 Trochanteric bursitis, left hip: Secondary | ICD-10-CM | POA: Diagnosis not present

## 2023-08-29 ENCOUNTER — Other Ambulatory Visit: Payer: Self-pay | Admitting: Gastroenterology

## 2023-08-29 ENCOUNTER — Other Ambulatory Visit: Payer: Self-pay | Admitting: Family Medicine

## 2023-09-23 ENCOUNTER — Other Ambulatory Visit: Payer: Self-pay | Admitting: Gastroenterology

## 2023-09-25 ENCOUNTER — Other Ambulatory Visit: Payer: Self-pay | Admitting: Gastroenterology

## 2023-10-26 ENCOUNTER — Other Ambulatory Visit: Payer: Self-pay | Admitting: Gastroenterology

## 2023-10-28 ENCOUNTER — Encounter: Payer: Self-pay | Admitting: Nurse Practitioner

## 2023-11-01 ENCOUNTER — Other Ambulatory Visit: Payer: Self-pay | Admitting: Gastroenterology

## 2023-12-11 ENCOUNTER — Ambulatory Visit
Admission: RE | Admit: 2023-12-11 | Discharge: 2023-12-11 | Disposition: A | Discharge status: Home / Self Care | Source: Ambulatory Visit | Attending: Student | Admitting: Student

## 2023-12-11 ENCOUNTER — Other Ambulatory Visit: Payer: Self-pay | Admitting: Student

## 2023-12-11 DIAGNOSIS — M79605 Pain in left leg: Secondary | ICD-10-CM | POA: Diagnosis present

## 2023-12-27 ENCOUNTER — Other Ambulatory Visit: Payer: Self-pay | Admitting: Gastroenterology

## 2024-01-21 ENCOUNTER — Encounter: Payer: Self-pay | Admitting: Nurse Practitioner

## 2024-01-22 ENCOUNTER — Other Ambulatory Visit: Payer: Self-pay | Admitting: Nurse Practitioner

## 2024-01-22 DIAGNOSIS — R600 Localized edema: Secondary | ICD-10-CM

## 2024-01-22 DIAGNOSIS — K219 Gastro-esophageal reflux disease without esophagitis: Secondary | ICD-10-CM

## 2024-01-22 MED ORDER — FUROSEMIDE 20 MG PO TABS: 20.0000 mg | ORAL_TABLET | Freq: Every day | ORAL | 5 refills | Status: AC | PRN

## 2024-01-22 MED ORDER — PANTOPRAZOLE SODIUM 20 MG PO TBEC: 20.0000 mg | DELAYED_RELEASE_TABLET | Freq: Every day | ORAL | 3 refills | Status: AC

## 2024-01-23 ENCOUNTER — Other Ambulatory Visit
Admission: RE | Admit: 2024-01-23 | Discharge: 2024-01-23 | Disposition: A | Discharge status: Home / Self Care | Source: Ambulatory Visit | Attending: Family Medicine | Admitting: Family Medicine

## 2024-01-23 DIAGNOSIS — M7989 Other specified soft tissue disorders: Secondary | ICD-10-CM | POA: Insufficient documentation

## 2024-01-23 LAB — D-DIMER, QUANTITATIVE: D-Dimer, Quant: 0.34 ug{FEU}/mL (ref 0.00–0.50)

## 2024-02-10 ENCOUNTER — Ambulatory Visit: Payer: Self-pay | Admitting: Nurse Practitioner

## 2024-02-10 ENCOUNTER — Ambulatory Visit: Payer: PPO | Admitting: Nurse Practitioner

## 2024-02-10 ENCOUNTER — Encounter: Payer: Self-pay | Admitting: Nurse Practitioner

## 2024-02-10 VITALS — BP 128/82 | HR 73 | Temp 98.5°F | Ht 64.0 in | Wt 209.2 lb

## 2024-02-10 DIAGNOSIS — E785 Hyperlipidemia, unspecified: Secondary | ICD-10-CM | POA: Diagnosis not present

## 2024-02-10 DIAGNOSIS — F418 Other specified anxiety disorders: Secondary | ICD-10-CM

## 2024-02-10 DIAGNOSIS — R600 Localized edema: Secondary | ICD-10-CM | POA: Diagnosis not present

## 2024-02-10 DIAGNOSIS — E559 Vitamin D deficiency, unspecified: Secondary | ICD-10-CM

## 2024-02-10 DIAGNOSIS — K219 Gastro-esophageal reflux disease without esophagitis: Secondary | ICD-10-CM

## 2024-02-10 DIAGNOSIS — N1832 Chronic kidney disease, stage 3b: Secondary | ICD-10-CM | POA: Diagnosis not present

## 2024-02-10 DIAGNOSIS — R413 Other amnesia: Secondary | ICD-10-CM

## 2024-02-10 LAB — CBC WITH DIFFERENTIAL/PLATELET
Basophils Absolute: 0.1 K/uL (ref 0.0–0.1)
Basophils Relative: 0.8 % (ref 0.0–3.0)
Eosinophils Absolute: 0.2 K/uL (ref 0.0–0.7)
Eosinophils Relative: 2.3 % (ref 0.0–5.0)
HCT: 41.8 % (ref 36.0–46.0)
Hemoglobin: 14.2 g/dL (ref 12.0–15.0)
Lymphocytes Relative: 30.3 % (ref 12.0–46.0)
Lymphs Abs: 2.4 K/uL (ref 0.7–4.0)
MCHC: 34 g/dL (ref 30.0–36.0)
MCV: 87.9 fl (ref 78.0–100.0)
Monocytes Absolute: 0.6 K/uL (ref 0.1–1.0)
Monocytes Relative: 7.5 % (ref 3.0–12.0)
Neutro Abs: 4.6 K/uL (ref 1.4–7.7)
Neutrophils Relative %: 59.1 % (ref 43.0–77.0)
Platelets: 174 K/uL (ref 150.0–400.0)
RBC: 4.75 Mil/uL (ref 3.87–5.11)
RDW: 13.9 % (ref 11.5–15.5)
WBC: 7.8 K/uL (ref 4.0–10.5)

## 2024-02-10 LAB — COMPREHENSIVE METABOLIC PANEL WITH GFR
ALT: 19 U/L (ref 0–35)
AST: 25 U/L (ref 0–37)
Albumin: 4.4 g/dL (ref 3.5–5.2)
Alkaline Phosphatase: 43 U/L (ref 39–117)
BUN: 16 mg/dL (ref 6–23)
CO2: 32 meq/L (ref 19–32)
Calcium: 9.4 mg/dL (ref 8.4–10.5)
Chloride: 102 meq/L (ref 96–112)
Creatinine, Ser: 1.28 mg/dL — ABNORMAL HIGH (ref 0.40–1.20)
GFR: 42.08 mL/min — ABNORMAL LOW (ref >60.00)
Glucose, Bld: 93 mg/dL (ref 70–99)
Potassium: 3.5 meq/L (ref 3.5–5.1)
Sodium: 141 meq/L (ref 135–145)
Total Bilirubin: 1.3 mg/dL — ABNORMAL HIGH (ref 0.2–1.2)
Total Protein: 7 g/dL (ref 6.0–8.3)

## 2024-02-10 LAB — LIPID PANEL
Cholesterol: 173 mg/dL (ref 0–200)
HDL: 83.8 mg/dL (ref >39.00)
LDL Cholesterol: 69 mg/dL (ref 0–99)
NonHDL: 89.55
Total CHOL/HDL Ratio: 2
Triglycerides: 101 mg/dL (ref 0.0–149.0)
VLDL: 20.2 mg/dL (ref 0.0–40.0)

## 2024-02-10 LAB — BRAIN NATRIURETIC PEPTIDE: Pro B Natriuretic peptide (BNP): 36 pg/mL (ref 0.0–100.0)

## 2024-02-10 LAB — VITAMIN D 25 HYDROXY (VIT D DEFICIENCY, FRACTURES): VITD: 44.28 ng/mL (ref 30.00–100.00)

## 2024-02-10 LAB — TSH: TSH: 3.05 u[IU]/mL (ref 0.35–5.50)

## 2024-02-10 MED ORDER — DONEPEZIL HCL 5 MG PO TABS: 5.0000 mg | ORAL_TABLET | Freq: Every day | ORAL | 2 refills | Status: AC

## 2024-02-10 MED ORDER — SERTRALINE HCL 50 MG PO TABS: 75.0000 mg | ORAL_TABLET | Freq: Every day | ORAL | 1 refills | Status: AC
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
## 2024-02-12 ENCOUNTER — Other Ambulatory Visit: Payer: Self-pay | Admitting: Nurse Practitioner

## 2024-02-12 DIAGNOSIS — R413 Other amnesia: Secondary | ICD-10-CM

## 2024-02-19 NOTE — Assessment & Plan Note (Addendum)
 She experiences progressive memory decline with a family history suggesting possible dementia. Donepezil  is considered pending neurology evaluation. Start donepezil  5 mg at bedtime. Referral previously placed to neurology, encouraged to contact and schedule for further evaluation and ensure safety measures at home.

## 2024-02-19 NOTE — Assessment & Plan Note (Signed)
 She is on Crestor  for cholesterol management. Continue Crestor  as prescribed. Check lipid panel.

## 2024-02-19 NOTE — Assessment & Plan Note (Signed)
 Check CMP. Follow up with Nephrology.

## 2024-02-19 NOTE — Assessment & Plan Note (Signed)
 Her mood is worsening, possibly related to memory decline. Increasing Zoloft  to 75 mg daily. Continue Wellbutrin  XL 300 mg daily. Encouraged to contact if worsening symptoms, unusual behavior changes or suicidal thoughts occur.

## 2024-02-19 NOTE — Assessment & Plan Note (Signed)
 Symptoms are effectively managed with Protonix . Continue Protonix  as prescribed.

## 2024-02-19 NOTE — Assessment & Plan Note (Signed)
 Chronic leg swelling, worsening, with no DVT confirmed by ultrasound and D-dimer. An echocardiogram is planned to assess cardiac function. Check lab work as outlined. Continue Lasix .

## 2024-03-23 ENCOUNTER — Telehealth: Payer: Self-pay

## 2024-03-23 NOTE — Telephone Encounter (Signed)
 Patient cancelled her appointment with Leron Glance, FNP-C, on 03/25/2024, with the following comment:  This appointment was to follow up on how aricept  was affecting me..I have not been taking it as it made me feel very strange and I am not comfortable taking it . I will reschedule after seeing a neurologist.

## 2024-03-25 ENCOUNTER — Ambulatory Visit: Admitting: Nurse Practitioner

## 2024-04-01 ENCOUNTER — Other Ambulatory Visit: Payer: Self-pay

## 2024-04-01 MED ORDER — ROSUVASTATIN CALCIUM 20 MG PO TABS: 20.0000 mg | ORAL_TABLET | Freq: Every day | ORAL | 3 refills | Status: AC

## 2024-04-27 ENCOUNTER — Ambulatory Visit

## 2024-04-27 VITALS — Ht 64.0 in | Wt 209.0 lb

## 2024-04-27 DIAGNOSIS — Z0000 Encounter for general adult medical examination without abnormal findings: Secondary | ICD-10-CM | POA: Diagnosis not present

## 2024-04-27 NOTE — Progress Notes (Signed)
 Subjective:   Michele Long is a 73 y.o. who presents for a Medicare Wellness preventive visit.  As a reminder, Annual Wellness Visits don't include a physical exam, and some assessments may be limited, especially if this visit is performed virtually. We may recommend an in-person follow-up visit with your provider if needed.  Visit Complete: Virtual I connected with  Michele Long on 04/27/24 by a audio enabled telemedicine application and verified that I am speaking with the correct person using two identifiers.  Patient Location: Home  Provider Location: Home Office  I discussed the limitations of evaluation and management by telemedicine. The patient expressed understanding and agreed to proceed.  Vital Signs: Because this visit was a virtual/telehealth visit, some criteria may be missing or patient reported. Any vitals not documented were not able to be obtained and vitals that have been documented are patient reported.  VideoDeclined- This patient declined Librarian, academic. Therefore the visit was completed with audio only.  Persons Participating in Visit: Patient.  AWV Questionnaire: Yes: Patient Medicare AWV questionnaire was completed by the patient on 04/26/24; I have confirmed that all information answered by patient is correct and no changes since this date.  Cardiac Risk Factors include: advanced age (>65men, >69 women);dyslipidemia     Objective:    Today's Vitals   04/27/24 1405  Weight: 209 lb (94.8 kg)  Height: 5' 4 (1.626 m)   Body mass index is 35.87 kg/m.     04/27/2024    2:09 PM 04/02/2023    2:34 PM 10/07/2022   11:49 AM 07/08/2022    9:41 AM 03/27/2022   10:17 AM 11/16/2021    5:27 PM 06/02/2021    2:41 PM  Advanced Directives  Does Patient Have a Medical Advance Directive? No No No No No No No  Would patient like information on creating a medical advance directive? Yes (MAU/Ambulatory/Procedural Areas - Information given)  Yes (MAU/Ambulatory/Procedural Areas - Information given)   No - Patient declined No - Patient declined     Current Medications (verified) Outpatient Encounter Medications as of 04/27/2024  Medication Sig   buPROPion  (WELLBUTRIN  XL) 150 MG 24 hr tablet TAKE 2 TABLETS BY MOUTH EVERY DAY (Patient taking differently: Take 150 mg by mouth daily.)   furosemide  (LASIX ) 20 MG tablet Take 1 tablet (20 mg total) by mouth daily as needed for edema.   hydrOXYzine  (ATARAX ) 10 MG tablet TAKE 1 TABLET BY MOUTH 3 TIMES DAILY AS NEEDED FOR ANXIETY.   pantoprazole  (PROTONIX ) 20 MG tablet Take 1 tablet (20 mg total) by mouth daily.   sertraline  (ZOLOFT ) 50 MG tablet Take 1.5 tablets (75 mg total) by mouth daily. (Patient taking differently: Take 50 mg by mouth daily.)   donepezil  (ARICEPT ) 5 MG tablet Take 1 tablet (5 mg total) by mouth at bedtime.   rosuvastatin  (CRESTOR ) 20 MG tablet Take 1 tablet (20 mg total) by mouth daily.   No facility-administered encounter medications on file as of 04/27/2024.    Allergies (verified) Erythromycin, Misc. sulfonamide containing compounds, and Sulfa antibiotics   History: Past Medical History:  Diagnosis Date   Allergy    Cataracts, bilateral    Chronic kidney disease    Depression    DVT (deep venous thrombosis) (HCC) 2010   Bilateral lower legs   Edema    Frequent headaches    Stress - 3-4x/yr   Gastric ulcer 10/27/2015   GERD (gastroesophageal reflux disease)    HTN (hypertension)  Microalbuminuria    Migraine    None for several years   Monocular diplopia of right eye 10/22/2016   Shingles    Symptomatic menopausal or female climacteric states    UTI (lower urinary tract infection)    Wears hearing aid in both ears    Past Surgical History:  Procedure Laterality Date   AUGMENTATION MAMMAPLASTY Bilateral 1983   BREAST ENHANCEMENT SURGERY  1983   CATARACT EXTRACTION W/PHACO Right 09/16/2022   Procedure: CATARACT EXTRACTION PHACO AND INTRAOCULAR  LENS PLACEMENT (IOC) RIGHT  clareon panoptic lens  3.61  00:26.9;  Surgeon: Myrna Adine Anes, MD;  Location: [facility];  Service: Ophthalmology;  Laterality: Right;   CATARACT EXTRACTION W/PHACO Left 10/07/2022   Procedure: CATARACT EXTRACTION PHACO AND INTRAOCULAR LENS PLACEMENT (IOC) LEFT  clarion panoptic lens  3.81  00:40.9;  Surgeon: Myrna Adine Anes, MD;  Location: [facility];  Service: Ophthalmology;  Laterality: Left;   COLONOSCOPY WITH PROPOFOL  N/A 07/08/2022   Procedure: COLONOSCOPY WITH PROPOFOL ;  Surgeon: Therisa Bi, MD;  Location: [facility];  Service: Gastroenterology;  Laterality: N/A;   ESOPHAGOGASTRODUODENOSCOPY N/A 07/08/2022   Procedure: ESOPHAGOGASTRODUODENOSCOPY (EGD);  Surgeon: Therisa Bi, MD;  Location: [facility];  Service: Gastroenterology;  Laterality: N/A;   EYE SURGERY  2024   Cataracts   NASAL SEPTUM SURGERY  2000   Family History  Problem Relation Age of Onset   Arthritis Mother    Hypertension Mother    Varicose Veins Mother    Hypertension Father    Osteoarthritis Father    Migraines Father    Hearing loss Father    Diabetes Brother    Cancer Maternal Grandmother    Diabetes Maternal Grandfather    Heart disease Maternal Grandfather    Arthritis Paternal Grandmother    Cancer Paternal Grandfather        Prostate   Pancreatic cancer Paternal Grandfather    Early death Son    Social History   Socioeconomic History   Marital status: Widowed    Spouse name: Not on file   Number of children: 2   Years of education: Not on file   Highest education level: 12th grade  Occupational History   Occupation: retired    Comment: Photographer  Tobacco Use   Smoking status: Former    Current packs/day: 0.00    Average packs/day: 0.3 packs/day for 1 year (0.3 ttl pk-yrs)    Types: Cigarettes    Start date: 36    Quit date: 1972    Years since quitting: 53.7   Smokeless tobacco: Never   Tobacco comments:    Smoked for 10  months in college ~4 cigarettes per day  Vaping Use   Vaping status: Never Used  Substance and Sexual Activity   Alcohol use: No   Drug use: Never   Sexual activity: Not Currently    Birth control/protection: None  Other Topics Concern   Not on file  Social History Narrative   2 Children (1 daughter 10) lives with patient and 1 son passed away at 7 months    2 dogs    Caffeine 1 cup coffee   Widower    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    Social Drivers of Health   Financial Resource Strain: Medium Risk (04/26/2024)   Overall Financial Resource Strain (CARDIA)    Difficulty of Paying Living Expenses: Somewhat hard  Food Insecurity: No Food Insecurity (04/26/2024)   Hunger Vital Sign    Worried  About Running Out of Food in the Last Year: Never true    Ran Out of Food in the Last Year: Never true  Transportation Needs: No Transportation Needs (04/26/2024)   PRAPARE - Administrator, Civil Service (Medical): No    Lack of Transportation (Non-Medical): No  Physical Activity: Inactive (04/26/2024)   Exercise Vital Sign    Days of Exercise per Week: 0 days    Minutes of Exercise per Session: 0 min  Stress: No Stress Concern Present (04/26/2024)   [employer] of Occupational Health - Occupational Stress Questionnaire    Feeling of Stress: Only a little  Social Connections: Socially Isolated (04/26/2024)   Social Connection and Isolation Panel    Frequency of Communication with Friends and Family: Twice a week    Frequency of Social Gatherings with Friends and Family: Once a week    Attends Religious Services: Never    Database administrator or Organizations: No    Attends Banker Meetings: Never    Marital Status: Widowed    Tobacco Counseling Counseling given: Not Answered Tobacco comments: Smoked for 10 months in college ~4 cigarettes per day    Clinical Intake:  Pre-visit preparation completed: Yes  Pain : No/denies pain  Diabetes:  No  Lab Results  Component Value Date   HGBA1C 5.8 (H) 02/07/2023   HGBA1C 5.8 02/06/2022   HGBA1C 6.0 04/22/2018     How often do you need to have someone help you when you read instructions, pamphlets, or other written materials from your doctor or pharmacy?: 1 - Never  Interpreter Needed?: No  Information entered by :: Charmaine Bloodgood LPN   Activities of Daily Living     04/27/2024    2:09 PM  In your present state of health, do you have any difficulty performing the following activities:  Hearing? 0  Vision? 0  Difficulty concentrating or making decisions? 0  Walking or climbing stairs? 0  Dressing or bathing? 0  Doing errands, shopping? 0  Preparing Food and eating ? N  Using the Toilet? N  In the past six months, have you accidently leaked urine? N  Do you have problems with loss of bowel control? N  Managing your Medications? N  Managing your Finances? N  Housekeeping or managing your Housekeeping? N    Patient Care Team: Gretel App, NP as PCP - General (Nurse Practitioner) Myrna Adine Anes, MD as Consulting Physician (Ophthalmology) Therisa Bi, MD as Consulting Physician (Gastroenterology)  I have updated your Care Teams any recent Medical Services you may have received from other providers in the past year.     Assessment:   This is a routine wellness examination for Michele Long.  Hearing/Vision screen Hearing Screening - Comments:: Wears hearing aids  Vision Screening - Comments:: Wears rx glasses - up to date with routine eye exams with Dr.    Mattie Addressed             This Visit's Progress    Maintain health and independence   On track      Depression Screen     04/27/2024    2:08 PM 02/10/2024    1:04 PM 07/02/2023    3:24 PM 04/02/2023    2:30 PM 02/07/2023    2:02 PM 03/27/2022   10:14 AM 02/06/2022    9:13 AM  PHQ 2/9 Scores  PHQ - 2 Score 0 4 3 2 1  0 0  PHQ- 9 Score  13  9 3 7       Fall Risk     04/27/2024    2:09 PM 02/10/2024     1:03 PM 04/02/2023    2:36 PM 02/07/2023    2:02 PM 03/27/2022   10:19 AM  Fall Risk   Falls in the past year? 0 0 1 1 0  Number falls in past yr: 0 0 0 0 0  Injury with Fall? 0 0 1 1 0  Risk for fall due to : No Fall Risks No Fall Risks History of fall(s) History of fall(s)   Follow up Falls prevention discussed;Education provided;Falls evaluation completed Falls evaluation completed Falls prevention discussed;Falls evaluation completed;Education provided Falls evaluation completed Falls evaluation completed      Data saved with a previous flowsheet row definition    MEDICARE RISK AT HOME:  Medicare Risk at Home Any stairs in or around the home?: No If so, are there any without handrails?: No Home free of loose throw rugs in walkways, pet beds, electrical cords, etc?: Yes Adequate lighting in your home to reduce risk of falls?: Yes Life alert?: No Use of a cane, walker or w/c?: No Grab bars in the bathroom?: Yes Shower chair or bench in shower?: No Elevated toilet seat or a handicapped toilet?: No  TIMED UP AND GO:  Was the test performed?  No  Cognitive Function: 6CIT completed        04/27/2024    2:09 PM 04/02/2023    2:37 PM 03/27/2022   10:32 AM 02/11/2020   11:57 AM 02/10/2019   12:16 PM  6CIT Screen  What Year? 0 points 0 points 0 points 0 points 0 points  What month? 0 points 0 points 0 points 0 points 0 points  What time? 0 points 0 points 0 points 0 points 0 points  Count back from 20 0 points 0 points 0 points 0 points 0 points  Months in reverse 0 points 2 points 0 points 0 points 0 points  Repeat phrase 0 points 0 points 0 points 0 points 0 points  Total Score 0 points 2 points 0 points 0 points 0 points    Immunizations Immunization History  Administered Date(s) Administered   INFLUENZA, HIGH DOSE SEASONAL PF 06/06/2017, 04/22/2018   Influenza,inj,Quad PF,6+ Mos 06/12/2016   PFIZER(Purple Top)SARS-COV-2 Vaccination 04/26/2020, 06/05/2020   Pneumococcal  Conjugate-13 05/14/2018   Tdap 06/02/2021    Screening Tests Health Maintenance  Topic Date Due   Zoster Vaccines- Shingrix (1 of 2) Never done   DEXA SCAN  Never done   Pneumococcal Vaccine: 50+ Years (2 of 2 - PPSV23, PCV20, or PCV21) 07/09/2018   Mammogram  10/15/2018   COVID-19 Vaccine (3 - 2025-26 season) 03/29/2024   Influenza Vaccine  10/26/2024 (Originally 02/27/2024)   Medicare Annual Wellness (AWV)  04/27/2025   Colonoscopy  07/08/2025   DTaP/Tdap/Td (2 - Td or Tdap) 06/03/2031   Hepatitis C Screening  Completed   HPV VACCINES  Aged Out   Meningococcal B Vaccine  Aged Out    Health Maintenance Items Addressed: Declines vaccines, mammogram, and dexa at this time   Additional Screening:  Vision Screening: Recommended annual ophthalmology exams for early detection of glaucoma and other disorders of the eye. Is the patient up to date with their annual eye exam?  Yes  Who is the provider or what is the name of the office in which the patient attends annual eye exams? Dr. Myrna   Dental Screening: Recommended annual dental  exams for proper oral hygiene  Community Resource Referral / Chronic Care Management: CRR required this visit?  No   CCM required this visit?  No   Plan:    I have personally reviewed and noted the following in the patient's chart:   Medical and social history Use of alcohol, tobacco or illicit drugs  Current medications and supplements including opioid prescriptions. Patient is not currently taking opioid prescriptions. Functional ability and status Nutritional status Physical activity Advanced directives List of other physicians Hospitalizations, surgeries, and ER visits in previous 12 months Vitals Screenings to include cognitive, depression, and falls Referrals and appointments  In addition, I have reviewed and discussed with patient certain preventive protocols, quality metrics, and best practice recommendations. A written personalized  care plan for preventive services as well as general preventive health recommendations were provided to patient.   Lavelle Pfeiffer Ledbetter, [place]   [id number]   After Visit Summary: (MyChart) Due to this being a telephonic visit, the after visit summary with patients personalized plan was offered to patient via MyChart   Notes: PCP Follow Up Recommendations: Patient states that she stopped Crestor  due to the side effects that she read and feels that after she discontinued it she has improvement with her cognition. Has also changed doses of depression medications due to improvement of depression.

## 2024-04-27 NOTE — Patient Instructions (Signed)
 Michele Long,  Thank you for taking the time for your Medicare Wellness Visit. I appreciate your continued commitment to your health goals. Please review the care plan we discussed, and feel free to reach out if I can assist you further.  Medicare recommends these wellness visits once per year to help you and your care team stay ahead of potential health issues. These visits are designed to focus on prevention, allowing your provider to concentrate on managing your acute and chronic conditions during your regular appointments.  Please note that Annual Wellness Visits do not include a physical exam. Some assessments may be limited, especially if the visit was conducted virtually. If needed, we may recommend a separate in-person follow-up with your provider.  Ongoing Care Seeing your primary care provider every 3 to 6 months helps us  monitor your health and provide consistent, personalized care.   Referrals If a referral was made during today's visit and you haven't received any updates within two weeks, please contact the referred provider directly to check on the status.  Recommended Screenings:  Health Maintenance  Topic Date Due   Zoster (Shingles) Vaccine (1 of 2) Never done   DEXA scan (bone density measurement)  Never done   Pneumococcal Vaccine for age over 4 (2 of 2 - PPSV23, PCV20, or PCV21) 07/09/2018   Breast Cancer Screening  10/15/2018   COVID-19 Vaccine (3 - 2025-26 season) 03/29/2024   Flu Shot  10/26/2024*   Medicare Annual Wellness Visit  04/27/2025   Colon Cancer Screening  07/08/2025   DTaP/Tdap/Td vaccine (2 - Td or Tdap) 06/03/2031   Hepatitis C Screening  Completed   HPV Vaccine  Aged Out   Meningitis B Vaccine  Aged Out  *Topic was postponed. The date shown is not the original due date.       04/27/2024    2:09 PM  Advanced Directives  Does Patient Have a Medical Advance Directive? No  Would patient like information on creating a medical advance directive? Yes  (MAU/Ambulatory/Procedural Areas - Information given)   Advance Care Planning is important because it: Ensures you receive medical care that aligns with your values, goals, and preferences. Provides guidance to your family and loved ones, reducing the emotional burden of decision-making during critical moments.  Information on Advanced Care Planning can be found at [place]  Secretary of [facility] Advance Health Care Directives Advance Health Care Directives ([email or web address]   Vision: Annual vision screenings are recommended for early detection of glaucoma, cataracts, and diabetic retinopathy. These exams can also reveal signs of chronic conditions such as diabetes and high blood pressure.  Dental: Annual dental screenings help detect early signs of oral cancer, gum disease, and other conditions linked to overall health, including heart disease and diabetes.  Please see the attached documents for additional preventive care recommendations.

## 2024-06-07 ENCOUNTER — Encounter: Payer: Self-pay | Admitting: Nurse Practitioner

## 2024-06-22 ENCOUNTER — Other Ambulatory Visit

## 2025-04-29 ENCOUNTER — Ambulatory Visit
